# Patient Record
Sex: Female | Born: 1965 | Race: White | Hispanic: No | Marital: Single | Smoking: Former smoker
Health system: Southern US, Community
[De-identification: ages and names within clinical notes are randomized; demographics above are authoritative.]

## PROBLEM LIST (undated history)

## (undated) DIAGNOSIS — S72002A Fracture of unspecified part of neck of left femur, initial encounter for closed fracture: Secondary | ICD-10-CM

## (undated) DIAGNOSIS — M8008XA Age-related osteoporosis with current pathological fracture, vertebra(e), initial encounter for fracture: Secondary | ICD-10-CM

## (undated) DIAGNOSIS — M8580 Other specified disorders of bone density and structure, unspecified site: Secondary | ICD-10-CM

## (undated) DIAGNOSIS — K219 Gastro-esophageal reflux disease without esophagitis: Secondary | ICD-10-CM

## (undated) DIAGNOSIS — F32A Depression, unspecified: Secondary | ICD-10-CM

## (undated) DIAGNOSIS — Z8619 Personal history of other infectious and parasitic diseases: Secondary | ICD-10-CM

## (undated) DIAGNOSIS — N952 Postmenopausal atrophic vaginitis: Secondary | ICD-10-CM

## (undated) DIAGNOSIS — K8010 Calculus of gallbladder with chronic cholecystitis without obstruction: Secondary | ICD-10-CM

## (undated) DIAGNOSIS — D069 Carcinoma in situ of cervix, unspecified: Secondary | ICD-10-CM

## (undated) DIAGNOSIS — K224 Dyskinesia of esophagus: Secondary | ICD-10-CM

## (undated) DIAGNOSIS — J4489 Other specified chronic obstructive pulmonary disease: Secondary | ICD-10-CM

## (undated) DIAGNOSIS — I10 Essential (primary) hypertension: Secondary | ICD-10-CM

## (undated) DIAGNOSIS — R519 Headache, unspecified: Secondary | ICD-10-CM

## (undated) DIAGNOSIS — N6019 Diffuse cystic mastopathy of unspecified breast: Secondary | ICD-10-CM

## (undated) DIAGNOSIS — N951 Menopausal and female climacteric states: Secondary | ICD-10-CM

## (undated) DIAGNOSIS — N179 Acute kidney failure, unspecified: Secondary | ICD-10-CM

## (undated) DIAGNOSIS — K760 Fatty (change of) liver, not elsewhere classified: Secondary | ICD-10-CM

## (undated) DIAGNOSIS — C801 Malignant (primary) neoplasm, unspecified: Secondary | ICD-10-CM

## (undated) DIAGNOSIS — J449 Chronic obstructive pulmonary disease, unspecified: Secondary | ICD-10-CM

## (undated) DIAGNOSIS — I6523 Occlusion and stenosis of bilateral carotid arteries: Secondary | ICD-10-CM

## (undated) DIAGNOSIS — D329 Benign neoplasm of meninges, unspecified: Secondary | ICD-10-CM

## (undated) DIAGNOSIS — R7989 Other specified abnormal findings of blood chemistry: Secondary | ICD-10-CM

## (undated) DIAGNOSIS — I2510 Atherosclerotic heart disease of native coronary artery without angina pectoris: Secondary | ICD-10-CM

## (undated) DIAGNOSIS — E785 Hyperlipidemia, unspecified: Secondary | ICD-10-CM

## (undated) DIAGNOSIS — M48061 Spinal stenosis, lumbar region without neurogenic claudication: Secondary | ICD-10-CM

## (undated) HISTORY — PX: HAND SURGERY: SHX662

## (undated) HISTORY — PX: BREAST BIOPSY: SHX20

## (undated) HISTORY — PX: BACK SURGERY: SHX140

## (undated) HISTORY — DX: Personal history of other infectious and parasitic diseases: Z86.19

## (undated) HISTORY — PX: TONSILLECTOMY: SUR1361

## (undated) HISTORY — PX: COLONOSCOPY: SHX174

## (undated) HISTORY — DX: Menopausal and female climacteric states: N95.1

## (undated) HISTORY — PX: FRACTURE SURGERY: SHX138

## (undated) HISTORY — PX: SPINE SURGERY: SHX786

## (undated) HISTORY — DX: Other specified disorders of bone density and structure, unspecified site: M85.80

## (undated) HISTORY — PX: JOINT REPLACEMENT: SHX530

## (undated) HISTORY — DX: Postmenopausal atrophic vaginitis: N95.2

## (undated) HISTORY — DX: Gastro-esophageal reflux disease without esophagitis: K21.9

## (undated) HISTORY — PX: CHOLECYSTECTOMY: SHX55

---

## 2006-01-10 ENCOUNTER — Ambulatory Visit: Payer: Self-pay | Admitting: Obstetrics and Gynecology

## 2006-09-05 ENCOUNTER — Ambulatory Visit: Payer: Self-pay | Admitting: Family Medicine

## 2006-09-13 ENCOUNTER — Ambulatory Visit: Payer: Self-pay | Admitting: Ophthalmology

## 2007-01-22 ENCOUNTER — Ambulatory Visit: Payer: Self-pay | Admitting: Obstetrics and Gynecology

## 2009-11-21 ENCOUNTER — Ambulatory Visit: Payer: Self-pay | Admitting: Otolaryngology

## 2009-12-30 ENCOUNTER — Ambulatory Visit: Payer: Self-pay | Admitting: Surgery

## 2010-01-23 ENCOUNTER — Ambulatory Visit: Payer: Self-pay | Admitting: Surgery

## 2010-08-24 ENCOUNTER — Ambulatory Visit: Payer: Self-pay | Admitting: Family Medicine

## 2010-09-21 ENCOUNTER — Encounter: Payer: Self-pay | Admitting: Surgery

## 2010-09-24 ENCOUNTER — Encounter: Payer: Self-pay | Admitting: Surgery

## 2010-10-09 ENCOUNTER — Ambulatory Visit: Payer: Self-pay | Admitting: Obstetrics and Gynecology

## 2010-10-20 ENCOUNTER — Ambulatory Visit: Payer: Self-pay

## 2010-11-02 ENCOUNTER — Ambulatory Visit: Payer: Self-pay | Admitting: Physician Assistant

## 2010-11-07 ENCOUNTER — Ambulatory Visit: Payer: Self-pay | Admitting: Oncology

## 2010-11-13 ENCOUNTER — Ambulatory Visit: Payer: Self-pay | Admitting: Internal Medicine

## 2010-11-25 ENCOUNTER — Ambulatory Visit: Payer: Self-pay | Admitting: Internal Medicine

## 2010-11-25 ENCOUNTER — Ambulatory Visit: Payer: Self-pay | Admitting: Oncology

## 2010-12-25 ENCOUNTER — Ambulatory Visit: Payer: Self-pay | Admitting: Oncology

## 2010-12-25 ENCOUNTER — Ambulatory Visit: Payer: Self-pay | Admitting: Internal Medicine

## 2011-03-23 ENCOUNTER — Ambulatory Visit: Payer: Self-pay | Admitting: Unknown Physician Specialty

## 2011-05-17 ENCOUNTER — Ambulatory Visit: Payer: Self-pay | Admitting: Unknown Physician Specialty

## 2011-05-23 LAB — PATHOLOGY REPORT

## 2011-08-28 ENCOUNTER — Ambulatory Visit: Payer: Self-pay | Admitting: Internal Medicine

## 2011-09-18 ENCOUNTER — Ambulatory Visit: Payer: Self-pay | Admitting: Specialist

## 2011-09-21 ENCOUNTER — Ambulatory Visit: Payer: Self-pay | Admitting: Specialist

## 2011-09-21 LAB — PREGNANCY, URINE: Pregnancy Test, Urine: NEGATIVE m[IU]/mL

## 2011-10-18 ENCOUNTER — Ambulatory Visit: Payer: Self-pay | Admitting: Neurosurgery

## 2011-12-05 ENCOUNTER — Ambulatory Visit: Payer: Self-pay | Admitting: Neurosurgery

## 2011-12-31 IMAGING — NM NM BONE LIMITED
2 series · 10 of 10 positions shown · non-contrast
Comparison: none

REASON FOR EXAM: back pain malaise and fatigue
COMMENTS:

[Series 1000: 3 hr wholebody · 2.40mm/px · 2 of 2 frames shown]
[frame 1/2]
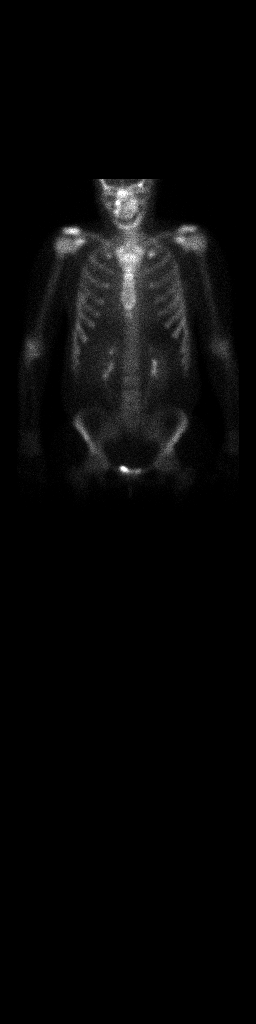
[frame 2/2]
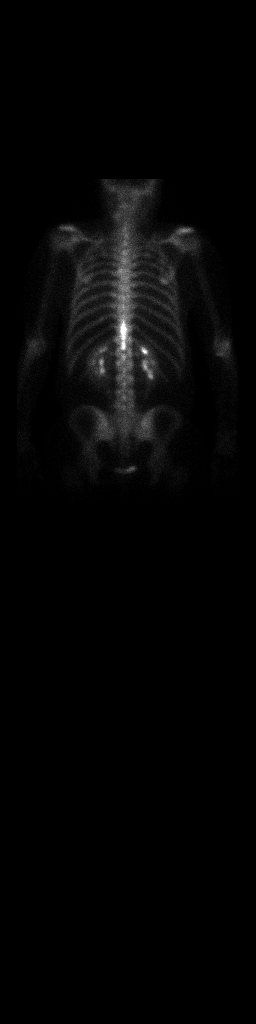

[Series 1000: statics · 2.40mm/px · 4 acquisitions, 8 frames shown]
[im 1/4]
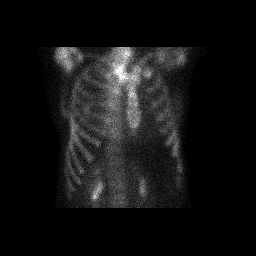
[im 1/4]
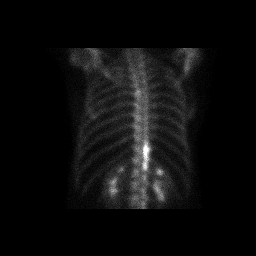
[im 2/4]
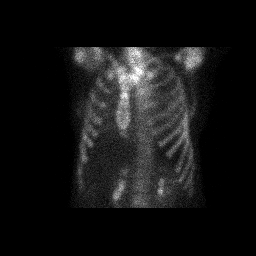
[im 2/4]
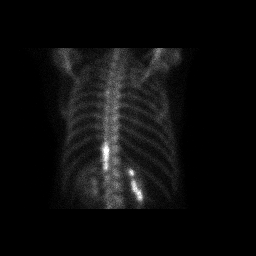
[im 3/4]
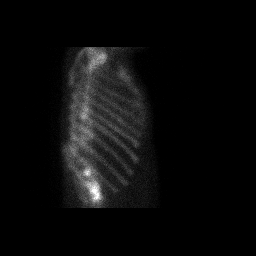
[im 3/4]
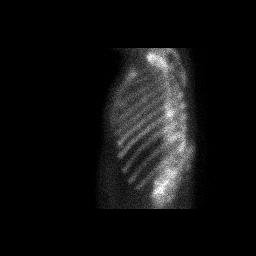
[im 4/4]
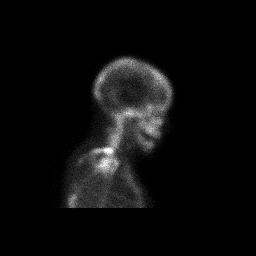
[im 4/4]
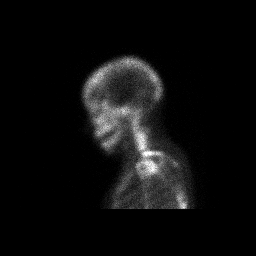

[10 of 10 positions shown; findings below may reference images not displayed]

PROCEDURE:     KNM - KNM LIMTED BONE SCAN SINGLE AREA  - November 02, 2010  [DATE]

RESULT:     The patient received 21.72 mCi of technetium 99m labeled MDP.
Images were acquired over the spine.

There is adequate uptake of the radiopharmaceutical by the skeleton.
Adequate soft tissue clearance and renal activity is demonstrated.

There is increased uptake of the radiopharmaceutical in the posterior
elements correction in the spinous process region of T11 and T12. This
appears to be midline and likely does not involve the pedicles. A tiny focus
of increased uptake at the Tkcs vertebral junction of approximately the
fifth or sixth ribs on the left is noted it is likely degenerative. The ribs
exhibit normal uptake. I see no abnormal uptake in the cervical spine or
remainder of the thoracic spine. The lumbar spine exhibits no abnormally
increased uptake. The observed portions of the shoulders and pelvis exhibit
no suspicious findings.
IMPRESSION: There is increased uptake of the radiopharmaceutical in
what appears to be the spinous processes of approximately T11 and T12. I do
not see abnormal uptake elsewhere. Specific attention to the T4 region
reveals no significant abnormality.

## 2012-02-04 ENCOUNTER — Ambulatory Visit: Payer: Self-pay | Admitting: Neurosurgery

## 2012-07-01 ENCOUNTER — Ambulatory Visit: Payer: Self-pay | Admitting: Anesthesiology

## 2012-07-08 ENCOUNTER — Emergency Department: Payer: Self-pay | Admitting: Emergency Medicine

## 2012-07-18 ENCOUNTER — Emergency Department: Payer: Self-pay | Admitting: Emergency Medicine

## 2012-07-18 LAB — BASIC METABOLIC PANEL
Calcium, Total: 9.5 mg/dL (ref 8.5–10.1)
Chloride: 107 mmol/L (ref 98–107)
Creatinine: 0.59 mg/dL — ABNORMAL LOW (ref 0.60–1.30)
EGFR (African American): >60
Osmolality: 273 (ref 275–301)
Potassium: 3.6 mmol/L (ref 3.5–5.1)

## 2012-07-18 LAB — CBC WITH DIFFERENTIAL/PLATELET
Basophil #: 0.1 10*3/uL (ref 0.0–0.1)
Eosinophil #: 0.2 10*3/uL (ref 0.0–0.7)
Eosinophil %: 2.2 %
HGB: 13.7 g/dL (ref 12.0–16.0)
MCH: 30.1 pg (ref 26.0–34.0)
MCV: 89 fL (ref 80–100)
Monocyte #: 0.6 x10 3/mm (ref 0.2–0.9)
Monocyte %: 8.7 %
Neutrophil %: 50.7 %
Platelet: 398 10*3/uL (ref 150–440)
RBC: 4.55 10*6/uL (ref 3.80–5.20)
RDW: 13.5 % (ref 11.5–14.5)

## 2012-07-29 ENCOUNTER — Ambulatory Visit: Payer: Self-pay | Admitting: Anesthesiology

## 2012-07-29 ENCOUNTER — Ambulatory Visit: Payer: Self-pay | Admitting: Obstetrics and Gynecology

## 2012-08-20 ENCOUNTER — Ambulatory Visit: Payer: Self-pay | Admitting: Hematology and Oncology

## 2012-12-10 LAB — HM PAP SMEAR: HM Pap smear: NEGATIVE

## 2013-08-19 ENCOUNTER — Ambulatory Visit: Payer: Self-pay | Admitting: Obstetrics and Gynecology

## 2013-08-19 LAB — HM MAMMOGRAPHY

## 2014-04-28 ENCOUNTER — Inpatient Hospital Stay: Payer: Self-pay | Admitting: Internal Medicine

## 2014-04-28 LAB — URINALYSIS, COMPLETE
BLOOD: NEGATIVE
Bacteria: NONE SEEN
Bilirubin,UR: NEGATIVE
Glucose,UR: NEGATIVE mg/dL (ref 0–75)
KETONE: NEGATIVE
Leukocyte Esterase: NEGATIVE
Nitrite: NEGATIVE
PH: 6 (ref 4.5–8.0)
Protein: NEGATIVE
RBC,UR: 2 /HPF (ref 0–5)
SPECIFIC GRAVITY: 1.013 (ref 1.003–1.030)
SQUAMOUS EPITHELIAL: NONE SEEN
WBC UR: 1 /HPF (ref 0–5)

## 2014-04-28 LAB — COMPREHENSIVE METABOLIC PANEL
ALT: 72 U/L — AB (ref 14–63)
Albumin: 3.6 g/dL (ref 3.4–5.0)
Alkaline Phosphatase: 82 U/L (ref 46–116)
Anion Gap: 10 (ref 7–16)
BUN: 10 mg/dL (ref 7–18)
Bilirubin,Total: 0.5 mg/dL (ref 0.2–1.0)
CREATININE: 0.82 mg/dL (ref 0.60–1.30)
Calcium, Total: 8.6 mg/dL (ref 8.5–10.1)
Chloride: 111 mmol/L — ABNORMAL HIGH (ref 98–107)
Co2: 25 mmol/L (ref 21–32)
EGFR (African American): >60
EGFR (Non-African Amer.): >60
GLUCOSE: 117 mg/dL — AB (ref 65–99)
Osmolality: 291 (ref 275–301)
Potassium: 3.3 mmol/L — ABNORMAL LOW (ref 3.5–5.1)
SGOT(AST): 44 U/L — ABNORMAL HIGH (ref 15–37)
SODIUM: 146 mmol/L — AB (ref 136–145)
Total Protein: 7.4 g/dL (ref 6.4–8.2)

## 2014-04-28 LAB — DRUG SCREEN, URINE
AMPHETAMINES, UR SCREEN: NEGATIVE (ref ?–1000)
Barbiturates, Ur Screen: NEGATIVE (ref ?–200)
Benzodiazepine, Ur Scrn: NEGATIVE (ref ?–200)
COCAINE METABOLITE, UR ~~LOC~~: POSITIVE (ref ?–300)
Cannabinoid 50 Ng, Ur ~~LOC~~: NEGATIVE (ref ?–50)
MDMA (ECSTASY) UR SCREEN: NEGATIVE (ref ?–500)
Methadone, Ur Screen: NEGATIVE (ref ?–300)
OPIATE, UR SCREEN: NEGATIVE (ref ?–300)
Phencyclidine (PCP) Ur S: NEGATIVE (ref ?–25)
Tricyclic, Ur Screen: NEGATIVE (ref ?–1000)

## 2014-04-28 LAB — CBC
HCT: 40.7 % (ref 35.0–47.0)
HGB: 13.9 g/dL (ref 12.0–16.0)
MCH: 31.9 pg (ref 26.0–34.0)
MCHC: 34 g/dL (ref 32.0–36.0)
MCV: 94 fL (ref 80–100)
Platelet: 363 10*3/uL (ref 150–440)
RBC: 4.34 10*6/uL (ref 3.80–5.20)
RDW: 14 % (ref 11.5–14.5)
WBC: 9.7 10*3/uL (ref 3.6–11.0)

## 2014-04-28 LAB — TSH: Thyroid Stimulating Horm: 0.44 u[IU]/mL — ABNORMAL LOW

## 2014-04-28 LAB — ACETAMINOPHEN LEVEL

## 2014-04-28 LAB — SALICYLATE LEVEL: SALICYLATES, SERUM: 2.3 mg/dL

## 2014-04-28 LAB — ETHANOL: Ethanol: 220 mg/dL

## 2014-04-29 LAB — COMPREHENSIVE METABOLIC PANEL
ANION GAP: 8 (ref 7–16)
AST: 28 U/L (ref 15–37)
Albumin: 3.2 g/dL — ABNORMAL LOW (ref 3.4–5.0)
Alkaline Phosphatase: 71 U/L (ref 46–116)
BUN: 11 mg/dL (ref 7–18)
Bilirubin,Total: 0.4 mg/dL (ref 0.2–1.0)
CALCIUM: 7.8 mg/dL — AB (ref 8.5–10.1)
CHLORIDE: 113 mmol/L — AB (ref 98–107)
Co2: 24 mmol/L (ref 21–32)
Creatinine: 0.75 mg/dL (ref 0.60–1.30)
EGFR (African American): >60
Glucose: 86 mg/dL (ref 65–99)
Osmolality: 287 (ref 275–301)
POTASSIUM: 3.7 mmol/L (ref 3.5–5.1)
SGPT (ALT): 56 U/L (ref 14–63)
Sodium: 145 mmol/L (ref 136–145)
TOTAL PROTEIN: 6.1 g/dL — AB (ref 6.4–8.2)

## 2014-04-29 LAB — CBC WITH DIFFERENTIAL/PLATELET
BASOS PCT: 0.2 %
Basophil #: 0 10*3/uL (ref 0.0–0.1)
EOS PCT: 0.5 %
Eosinophil #: 0.1 10*3/uL (ref 0.0–0.7)
HCT: 34.1 % — ABNORMAL LOW (ref 35.0–47.0)
HGB: 11.6 g/dL — AB (ref 12.0–16.0)
LYMPHS ABS: 3.1 10*3/uL (ref 1.0–3.6)
Lymphocyte %: 28.7 %
MCH: 32.2 pg (ref 26.0–34.0)
MCHC: 34.1 g/dL (ref 32.0–36.0)
MCV: 94 fL (ref 80–100)
MONOS PCT: 7.3 %
Monocyte #: 0.8 x10 3/mm (ref 0.2–0.9)
NEUTROS ABS: 6.8 10*3/uL — AB (ref 1.4–6.5)
Neutrophil %: 63.3 %
Platelet: 287 10*3/uL (ref 150–440)
RBC: 3.61 10*6/uL — AB (ref 3.80–5.20)
RDW: 14 % (ref 11.5–14.5)
WBC: 10.7 10*3/uL (ref 3.6–11.0)

## 2014-04-29 LAB — MAGNESIUM: Magnesium: 1.6 mg/dL — ABNORMAL LOW

## 2014-04-29 LAB — PHOSPHORUS: Phosphorus: 3.4 mg/dL (ref 2.5–4.9)

## 2014-04-30 LAB — COMPREHENSIVE METABOLIC PANEL
ALT: 46 U/L (ref 14–63)
ANION GAP: 5 — AB (ref 7–16)
Albumin: 2.5 g/dL — ABNORMAL LOW (ref 3.4–5.0)
Alkaline Phosphatase: 61 U/L (ref 46–116)
BUN: 7 mg/dL (ref 7–18)
Bilirubin,Total: 0.4 mg/dL (ref 0.2–1.0)
CO2: 25 mmol/L (ref 21–32)
CREATININE: 0.64 mg/dL (ref 0.60–1.30)
Calcium, Total: 7.8 mg/dL — ABNORMAL LOW (ref 8.5–10.1)
Chloride: 109 mmol/L — ABNORMAL HIGH (ref 98–107)
EGFR (African American): >60
GLUCOSE: 112 mg/dL — AB (ref 65–99)
Osmolality: 276 (ref 275–301)
Potassium: 3.8 mmol/L (ref 3.5–5.1)
SGOT(AST): 27 U/L (ref 15–37)
Sodium: 139 mmol/L (ref 136–145)
Total Protein: 5.4 g/dL — ABNORMAL LOW (ref 6.4–8.2)

## 2014-04-30 LAB — CBC WITH DIFFERENTIAL/PLATELET
BASOS ABS: 0 10*3/uL (ref 0.0–0.1)
Basophil %: 0.4 %
EOS ABS: 0.1 10*3/uL (ref 0.0–0.7)
Eosinophil %: 1.3 %
HCT: 31 % — AB (ref 35.0–47.0)
HGB: 10.3 g/dL — AB (ref 12.0–16.0)
LYMPHS PCT: 27.9 %
Lymphocyte #: 2.3 10*3/uL (ref 1.0–3.6)
MCH: 31.7 pg (ref 26.0–34.0)
MCHC: 33.1 g/dL (ref 32.0–36.0)
MCV: 96 fL (ref 80–100)
MONOS PCT: 9 %
Monocyte #: 0.7 x10 3/mm (ref 0.2–0.9)
NEUTROS PCT: 61.4 %
Neutrophil #: 5 10*3/uL (ref 1.4–6.5)
Platelet: 240 10*3/uL (ref 150–440)
RBC: 3.25 10*6/uL — ABNORMAL LOW (ref 3.80–5.20)
RDW: 14.2 % (ref 11.5–14.5)
WBC: 8.2 10*3/uL (ref 3.6–11.0)

## 2014-04-30 LAB — PHOSPHORUS: Phosphorus: 1.6 mg/dL — ABNORMAL LOW (ref 2.5–4.9)

## 2014-04-30 LAB — MAGNESIUM: Magnesium: 2.1 mg/dL

## 2014-05-01 LAB — BASIC METABOLIC PANEL
Anion Gap: 3 — ABNORMAL LOW (ref 7–16)
BUN: 6 mg/dL — AB (ref 7–18)
Calcium, Total: 8.3 mg/dL — ABNORMAL LOW (ref 8.5–10.1)
Chloride: 110 mmol/L — ABNORMAL HIGH (ref 98–107)
Co2: 30 mmol/L (ref 21–32)
Creatinine: 0.61 mg/dL (ref 0.60–1.30)
GLUCOSE: 122 mg/dL — AB (ref 65–99)
OSMOLALITY: 284 (ref 275–301)
Potassium: 3.9 mmol/L (ref 3.5–5.1)
Sodium: 143 mmol/L (ref 136–145)

## 2014-05-01 LAB — PHOSPHORUS: Phosphorus: 2 mg/dL — ABNORMAL LOW (ref 2.5–4.9)

## 2014-05-01 LAB — CBC WITH DIFFERENTIAL/PLATELET
BASOS ABS: 0 10*3/uL (ref 0.0–0.1)
Basophil %: 0.3 %
EOS ABS: 0.2 10*3/uL (ref 0.0–0.7)
Eosinophil %: 2.2 %
HCT: 29.3 % — ABNORMAL LOW (ref 35.0–47.0)
HGB: 9.9 g/dL — ABNORMAL LOW (ref 12.0–16.0)
LYMPHS ABS: 2 10*3/uL (ref 1.0–3.6)
Lymphocyte %: 24.3 %
MCH: 32.2 pg (ref 26.0–34.0)
MCHC: 33.6 g/dL (ref 32.0–36.0)
MCV: 96 fL (ref 80–100)
Monocyte #: 0.8 x10 3/mm (ref 0.2–0.9)
Monocyte %: 9.7 %
Neutrophil #: 5.1 10*3/uL (ref 1.4–6.5)
Neutrophil %: 63.5 %
Platelet: 198 10*3/uL (ref 150–440)
RBC: 3.07 10*6/uL — ABNORMAL LOW (ref 3.80–5.20)
RDW: 14.1 % (ref 11.5–14.5)
WBC: 8 10*3/uL (ref 3.6–11.0)

## 2014-05-01 LAB — MAGNESIUM: MAGNESIUM: 1.8 mg/dL

## 2014-05-02 LAB — CBC WITH DIFFERENTIAL/PLATELET
Basophil #: 0 10*3/uL (ref 0.0–0.1)
Basophil %: 0.5 %
EOS ABS: 0.2 10*3/uL (ref 0.0–0.7)
Eosinophil %: 2.3 %
HCT: 31 % — ABNORMAL LOW (ref 35.0–47.0)
HGB: 10.3 g/dL — ABNORMAL LOW (ref 12.0–16.0)
LYMPHS PCT: 16.4 %
Lymphocyte #: 1.5 10*3/uL (ref 1.0–3.6)
MCH: 31.8 pg (ref 26.0–34.0)
MCHC: 33.3 g/dL (ref 32.0–36.0)
MCV: 95 fL (ref 80–100)
Monocyte #: 1 x10 3/mm — ABNORMAL HIGH (ref 0.2–0.9)
Monocyte %: 10.2 %
NEUTROS PCT: 70.6 %
Neutrophil #: 6.7 10*3/uL — ABNORMAL HIGH (ref 1.4–6.5)
Platelet: 211 10*3/uL (ref 150–440)
RBC: 3.25 10*6/uL — ABNORMAL LOW (ref 3.80–5.20)
RDW: 14 % (ref 11.5–14.5)
WBC: 9.5 10*3/uL (ref 3.6–11.0)

## 2014-05-02 LAB — BASIC METABOLIC PANEL
Anion Gap: 5 — ABNORMAL LOW (ref 7–16)
BUN: 9 mg/dL (ref 7–18)
CALCIUM: 8.6 mg/dL (ref 8.5–10.1)
CHLORIDE: 105 mmol/L (ref 98–107)
CREATININE: 0.57 mg/dL — AB (ref 0.60–1.30)
Co2: 31 mmol/L (ref 21–32)
EGFR (African American): >60
EGFR (Non-African Amer.): >60
Glucose: 109 mg/dL — ABNORMAL HIGH (ref 65–99)
Osmolality: 281 (ref 275–301)
Potassium: 3.9 mmol/L (ref 3.5–5.1)
SODIUM: 141 mmol/L (ref 136–145)

## 2014-05-02 LAB — MAGNESIUM: Magnesium: 1.6 mg/dL — ABNORMAL LOW

## 2014-05-02 LAB — PHOSPHORUS: Phosphorus: 3.2 mg/dL (ref 2.5–4.9)

## 2014-05-03 LAB — EXPECTORATED SPUTUM ASSESSMENT W GRAM STAIN, RFLX TO RESP C

## 2014-05-14 ENCOUNTER — Inpatient Hospital Stay: Payer: Self-pay | Admitting: Psychiatry

## 2014-06-07 ENCOUNTER — Ambulatory Visit: Payer: Self-pay | Admitting: Family Medicine

## 2014-07-18 NOTE — Op Note (Signed)
PATIENT NAME:  Shirley Evans, Shirley Evans MR#:  [id number] DATE OF BIRTH:  01/07/1966  DATE OF PROCEDURE:  09/21/2011  PREOPERATIVE DIAGNOSIS: Foreign body right thenar eminence.   POSTOPERATIVE DIAGNOSIS: Several small foreign bodies with scar tissue present.   PROCEDURE: Excision of foreign bodies and scar tissue, right thenar eminence.   SURGEON: Lucas Mallow, MD   ANESTHESIA: General.   COMPLICATIONS: None.   TOURNIQUET TIME: Approximately 45 minutes.   PROCEDURE: After adequate induction of general anesthesia, the right upper extremity is thoroughly prepped with alcohol and ChloraPrep and draped in standard sterile fashion. Extremity is wrapped out with the Esmarch bandage and pneumatic tourniquet elevated to 250 mmHg. Under loupe magnification the line of the previous laceration is entered in the midportion of the thenar eminence. The hypertrophic scar is completely excised. Dissection is carefully carried down into the thenar eminence under loupe magnification. There is seen to be four to five pieces of dark foreign body which are approximately 1 mm in size. This is dirt contamination or possibly small metallic fragments. These are surrounded by a large amount of hypertrophic scar tissue. This was all carefully excised down to the flexor pollicis longus which is preserved. Careful palpation demonstrates no residual scar tissue. The FluoroScan was used and no residual foreign body could be seen. The wound is thoroughly irrigated multiple times. Skin edges are closed with 4-0 nylon. Soft bulky dressing is applied. Tourniquet is released. The patient is returned to the recovery room in satisfactory condition having tolerated the procedure quite well.   ____________________________ Lucas Mallow, MD ces:drc D: 09/21/2011 11:15:42 ET T: 09/21/2011 11:30:31 ET JOB#: [id number] cc: Lucas Mallow, MD, <Dictator> Lucas Mallow MD ELECTRONICALLY SIGNED 09/27/2011 8:35

## 2014-07-25 NOTE — H&P (Signed)
PATIENT NAME:  Shirley Evans, Shirley Evans MR#:  [id number] DATE OF BIRTH:  1966/02/10  DATE OF ADMISSION:  05/14/2014  IDENTIFYING INFORMATION: The patient is a 49 year old female who was initially admitted on February 3 after she overdosed on Robaxin and Neurontin.   CHIEF COMPLAINT: Overdose on her medications.   HISTORY OF PRESENT ILLNESS: The patient was initially admitted to critical care unit and after a prolonged period, she was transferred to the regular floor. The patient was initially intubated and was unable to provide any history. After her initial stabilization, she was seen by Dr. Weber Cooks, who interviewed her for psychiatric consult. She was transferred to the psychiatric unit last night. During my interview, the patient reported that she overdosed on her back pain medications. She reported that she does not remember  why she took an overdose of the medication. She reported that she was not trying to kill herself. She stated that she took too many pills, as she might be stressed out relating to her money issues, as they were trying to stop her disability. She is getting the pain medications from the [facility]. She stated that she is also getting the steroid injections in her back. The patient currently denied having any mood swings, anger, anxiety, or paranoia. She stated that she does not recall any psychotic symptoms. She denied using any drugs or alcohol at this time. She is motivated to quit smoking at this time.   PAST PSYCHIATRIC HISTORY: The patient denied any previous history of psychiatric hospitalizations. She is not taking any psychotropic medications at this time.   FAMILY HISTORY: Unknown.   SOCIAL HISTORY: The patient currently lives with her boyfriend in between her mother. Reported that she is best friends with her mother. She was on disability, which is going to stop soon.   PAST MEDICAL HISTORY: History of GERD, chronic pain. She has been taking Neurontin and Robaxin for  pain and is also getting steroid shots. She also has a questionable history of Hashimoto disease or autoimmune condition.   CURRENT MEDICATIONS: Prozac, Seroquel, and trazodone.   ALLERGIES: CODEINE AND DEMEROL.   VITAL SIGNS: Glucose 153, BUN 19, creatinine 0.65 sodium 140, potassium 3.2, chloride 104, bicarbonate 28, anion gap 8, osmolality 60, calcium 9.4, phosphorus 2.2. WBC 8.5, RBC 3.89, hemoglobin 12.1, platelet count 549,000.   MENTAL STATUS EXAMINATION: The patient is a moderately built female who appeared her stated age. She was calm and cooperative. She maintained fair eye contact. Her speech was normal in tone and volume. Mood was fine. Affect was congruent. Thought process was logical, goal-directed. Thought content was non-delusional. She currently denied having any suicidal or homicidal ideations or plans. Her insight and judgment were fair.   DIAGNOSTIC IMPRESSION: AXIS I: Major depressive disorder, single episode, severe.  AXIS II: None reported.  AXIS III: Recent admission to the intensive care unit, status post overdose.   AXIS IV: Moderate. Current global assessment of functioning 25.   TREATMENT PLAN: 1. The patient recently transferred from the medical unit for psychiatric stabilization.  2. She will continue on her current psychotropic medication as follows:     1. Prozac 20 mg p.o. daily.     2. Trazodone 100 mg p.o. at bedtime for insomnia.  3. I will start her on Chantix 0.5 mg p.o. daily for smoking cessation.   Discussed with the patient about the plan, and she agreed at this time.   Thank you for allowing me to participate in the care of  this patient.    ____________________________ Cordelia Pen. Gretel Acre, MD usf:mw D: 05/15/2014 13:21:37 ET T: 05/15/2014 13:33:57 ET JOB#: [id number]  cc: Cordelia Pen. Gretel Acre, MD, <Dictator> Jeronimo Norma MD ELECTRONICALLY SIGNED 05/16/2014 13:11

## 2014-07-25 NOTE — H&P (Signed)
PATIENT NAME:  Shirley Evans, Shirley Evans MR#:  [id number] DATE OF BIRTH:  04/14/1965  DATE OF ADMISSION:  04/28/2014  PRIMARY CARE PROVIDER: Valetta Close, MD  CHIEF COMPLAINT: Overdose on multiple medications, confusion.   HISTORY OF PRESENTING ILLNESS: A 49 year old Caucasian female patient with history of depression, chronic pain, neuropathy, was found in the bathtub by her mother with empty bottles of Neurontin and Robaxin. Per her mother, the patient has been talking about suicidal ideation for the past few days, but never had a suicide attempt in the past.   The patient also has alcohol in her blood, along with cocaine positive on urine drug screen.   History has been obtained from ER staff, old records. The patient had to be intubated to protect her airway after she had multiple bouts of vomiting and low GCS.   PAST MEDICAL HISTORY: 1.  Chronic back pain.  2.  Meningioma.  3.  Fibrocystic breast disease.  4.  GERD.   PAST SURGICAL HISTORY: Right breast, 2 cysts removed when she was 17 and tonsillectomy.   SOCIAL HISTORY: The patient continues to smoke a pack a day. Occasional alcohol use.   FAMILY HISTORY: Paternal uncle had prostate cancer. A paternal aunt had breast cancer and cervical cancer.   HOME MEDICATIONS: 1.  Methocarbamol 750 mg oral once a day.  2.  Tramadol 50 mg every 6 hours as needed.  3.  Neurontin 300 mg oral 2 times a day.   REVIEW OF SYSTEMS:  Unobtainable due to the patient's encephalopathy and being intubated.   ALLERGIES: CODEINE AND DEMEROL.   PHYSICAL EXAMINATION: VITAL SIGNS: Shows temperature 96.2, pulse of 90, blood pressure 139/87, saturating 100% on oxygen on the ventilator.  GENERAL: Moderately built Caucasian female patient lying in bed, drowsy, ET tube in place.  HEENT: Normocephalic. Mucosa moist and pink.  NECK: Supple. No thyromegaly. No palpable lymph nodes.  CARDIOVASCULAR: S1, S2, without any murmurs. No edema.  RESPIRATORY: Clear to  auscultation on both sides.  GASTROINTESTINAL: Soft abdomen. No rigidity or guarding found. Bowel sounds present.  GENITOURINARY: Foley catheter in place with clear urine.  MUSCULOSKELETAL: No joint swelling, redness, effusion. Normal muscle tone.  NEUROLOGICAL: Babinski's downgoing, reflexes 2+, withdraws to pain.   LABORATORY DATA:  Glucose of 117, BUN 10, creatinine 0.82, sodium 146, potassium 3.3, chloride 111. AST and ALT mildly elevated at 72, 44. Alkaline phosphatase, bilirubin normal. TSH 0.44. Urine drug screen positive for cocaine. WBC 9.7, hemoglobin 13.9, platelets 363,000. Urinalysis shows no bacteria, 1 WBC. Salicylates 2.3. Acetaminophen less than 2. Alcohol level 220.   ABG shows pH of 7.29 with pCO2 of 40.   EKG shows normal sinus rhythm, nothing acute.   Chest x-ray, portable, showed no edema, effusion, infiltrates; ET tube in place.   ASSESSMENT AND PLAN: 1.  Polysubstance overdose in a patient with suicidal ideation. At this time, the patient is being placed under involuntary commitment by the Emergency Room physician. She has been intubated to protect her airway and for encephalopathy. Will be admitted to intensive care unit. We will continue supportive care.  2.  At this point, she does seem dehydrated with elevated sodium. We will start her on half normal saline. We will consult psychiatry to see the patient when she is more awake.  3.  The patient is high risk for alcohol withdrawal off the ventilator, although her alcohol intake cannot be quantified at this point.  4.  Hypokalemia. Replace potassium through intravenous.  5.  Acidosis on ABG, pCO2 is normal. This seems to be a metabolic acidosis. Will check a lactic acid level.  6.  Deep vein thrombosis prophylaxis with Lovenox.   CODE STATUS: Full code.   TIME SPENT TODAY ON THIS PATIENT TODAY: 40 minutes.   ____________________________ Leia Alf Shirley Lantry, MD srs:LT D: 04/28/2014 24:23:53 ET T: 04/28/2014  17:25:22 ET JOB#: [id number]  cc: Alveta Heimlich R. Darvin Neighbours, MD, <Dictator> Valetta Close, MD Neita Carp MD ELECTRONICALLY SIGNED 05/03/2014 3:58

## 2014-07-25 NOTE — Consult Note (Signed)
PATIENT NAME:  Shirley Evans, Shirley Evans MR#:  [id number] DATE OF BIRTH:  May 17, 1965  DATE OF CONSULTATION:  05/17/2014  REFERRING PHYSICIAN:  Alethia Berthold, MD.  CONSULTING PHYSICIAN:  A. Lavone Orn, MD  CHIEF COMPLAINT: Elevated thyroglobulin antibody.   HISTORY OF PRESENT ILLNESS: This is a 49 year old female admitted on 04/28/2014 due to overdose on Robaxin and Neurontin. The patient has had a prolonged hospitalization during which time she was intubated for a time in the intensive care unit, she  had mental status changes concerning for an encephalopathy which was ultimately attributed to her medications as well as intubated status. She was eventually weaned off the mechanical ventilator and clinically improved enough to transfer to the mental health unit. It was noted on 05/07/2014 that her TSH level was low at 0.24 uIU/mL  (reference range 0.45-4.5) as well as thyroglobulin antibody level was elevated at 9.8 IU/mL. On 05/09/2014 free T4 was noted to be normal at 1.04 ng/dL, and free T3 was low at 1.7 pg/dL. She has no prior history of thyroid disease. She denies symptoms of hyperthyroidism such as heat intolerance, tremor, or heart racing. She does report anxiety. Sleep is fair. She has not had any recent weight loss, in fact reports about a 30 pound weight gain over the last 6 months. Prior to hospitalization she was receiving steroid injections for back pain. She has been getting them monthly, last injection was January 2016. She has not had any recent use of lithium. On 05/05/2014 she did have exposure to contrast dye when she underwent a CT head with and without contrast. That study showed no acute intracranial findings. No recent use of amiodarone. She denies neck pain. Denies recent fever.   PAST MEDICAL HISTORY:  1. GERD.   2. Back pain.   3. Meningioma.   FAMILY HISTORY: The patient reports that paternal grandmother as well as several paternal cousins may have some sort of thyroid disorder,  details not clear. Father had colon cancer and mother had diabetes.   SOCIAL HISTORY: The patient has been on disability for back pain. Lives with boyfriend.   ALLERGIES: CODEINE, DEMEROL.   REVIEW OF SYSTEMS:    GENERAL: She reports weight gain as per HPI. No recent fevers.  HEENT: Denies blurred vision. Denies headache.  NECK: Denies neck pain or dysphagia.  CARDIAC: Denies chest pain or palpitation.  PULMONARY: Denies cough or shortness of breath.  ABDOMEN: Denies abdominal pain. Denies nausea.  ENDOCRINE:  Denies heat and cold intolerance.  NEUROLOGIC: No tremor. No dysarthria.  EXTREMITIES:  No lower extremity swelling.  SKIN: No rash or recent skin changes.  GENITOURINARY: No dysuria or hematuria.   PHYSICAL EXAMINATION:  VITAL SIGNS: Height 63.9 inches, weight 148 pounds, temperature 98.9, pulse 87, respirations 20, blood pressure 122/75.  GENERAL: Well-developed white female in no acute distress.  HEENT: EOMI.  Oropharynx is clear. Mucous membranes moist.  NECK: Supple. No appreciable thyromegaly. No palpable thyroid nodules. No neck tenderness elicited.  CARDIAC: Regular rate and rhythm. No audible murmur.  PULMONARY: Clear to auscultation bilaterally.  MUSCULOSKELETAL: She has dorsocervical fat pads bilaterally. No lower extremity edema.  SKIN: No rash or dermatopathy noted.  PSYCHIATRIC: Calm, cooperative, alert, and oriented.  ABDOMEN: Diffusely soft, nontender, nondistended.   LABORATORY DATA: Laboratories per HPI. No more recent laboratories in last 7 days.   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   PLAN:  Low TSH may be due to sick euthyroid versus mild  hyperthyroidism versus TSH suppression from glucocorticoids. Recent exposure to iodinated contrast dye may have led to mild hyperthyroidism. I plan to repeat a TSH level tomorrow morning. If TSH has normalized, then she does not have hyperthyroidism  and rather had sick euthyroid in the setting of critical illness. The elevated antithyroglobulin antibody suggests propensity towards autoimmune thyroid disease, however if TSH is normal then she does not currently have thyroid dysfunction. Elevated thyroid antibodies of various types (antithyroglobulin antibodies, thyroid peroxidase antibodies, and thyrotropin receptor antibodies), are often seen in individuals with autoimmune thyroid disease. In and of themselves, the antibodies have no clinical significance other than the propensity towards thyroid disease. Must rely on thyroid function tests including TSH, T4, and T3 to assess for true thyroid dysfunction. Again she did have a mildly suppressed TSH last week and she may have mild hyperthyroidism as a cause. Key will be to assess thyroid function TSH at this time to determine whether this needs treatment.   Plan to follow up with patient once her TSH is resulted. If TSH is normal then  no followup will be needed. Routine annual TSH testing may be helpful.    ____________________________ A. Lavone Orn, MD ams:bu D: 05/17/2014 20:40:50 ET T: 05/17/2014 21:03:24 ET JOB#: [id number]  cc: A. Lavone Orn, MD, <Dictator> Sherlon Handing MD ELECTRONICALLY SIGNED 05/21/2014 17:35

## 2014-07-25 NOTE — Consult Note (Signed)
PATIENT NAME:  Shirley Evans, Shirley Evans MR#:  [id number] DATE OF BIRTH:  1965/08/11  DATE OF CONSULTATION:  05/04/2014  REFERRING PHYSICIAN:   CONSULTING PHYSICIAN:  Gonzella Lex, MD  HISTORY OF PRESENT ILLNESS: A 49 year old woman who presented to the hospital on February 3 having been found unconscious with evidence of possible intentional overdose. Has been in the critical care unit since then. I came by to see the patient today, but she is still intubated. It looks like extubation was attempted and the patient was not able to tolerate it yet, so she is back on the ventilator. I note that she had cocaine in her system when she was admitted as well as an intoxicated level of alcohol. Family had reported that she had made suicidal statements, but that she had not had a past history of suicidal behavior. Further evaluation right now is not possible.   PAST PSYCHIATRIC HISTORY: None known at this time.   PAST MEDICAL HISTORY: A meningioma and also of some chronic pain, has had back surgery in the past.   ASSESSMENT: A 49 year old woman who certainly may have made a suicide attempt, although we do not have all the information yet. I will hold off on gathering anymore collateral history until it becomes clear that the patient is going to recover and can be interviewed. I will check in on her regularly, if I am needed sooner please let me know.   DIAGNOSIS, PRINCIPAL AND PRIMARY:  AXIS I: Delirium due to overdose of multiple medications, rule out depression.    ____________________________ Gonzella Lex, MD jtc:TM D: 05/04/2014 18:35:55 ET T: 05/04/2014 18:56:31 ET JOB#: [id number]  cc: Gonzella Lex, MD, <Dictator> Gonzella Lex MD ELECTRONICALLY SIGNED 05/05/2014 17:29

## 2014-07-25 NOTE — Consult Note (Signed)
PATIENT NAME:  Shirley Evans, Shirley Evans MR#:  [id number] DATE OF BIRTH:  Oct 28, 1965  DATE OF CONSULTATION:  05/14/2014  REFERRING PHYSICIAN:   CONSULTING PHYSICIAN:  Gonzella Lex, MD  IDENTIFYING INFORMATION AND REASON FOR CONSULTATION: A 49 year old woman who was brought to the hospital on February 3 after a presumed overdose possibly on Robaxin and Neurontin. She had an extended stay in the Critical Care Unit and a prolonged period before return of full consciousness. The patient is now alert and able to converse and ready for consideration of psychiatric treatment. The patient tells me that she is still feeling sad and depressed.   HISTORY OF PRESENT ILLNESS: Brought to the hospital on February 3. Was unconscious at that time. Remained at first intubated and unable to give history for many days. In the last couple of days, she has woken up to where I have gotten to talk with her. The patient reports that she had been under at great deal of stress recently. Began to feel hopeless over the last couple of weeks prior to her hospitalization. Was overwhelmed by financial burdens and feeling unsupported. She says that she does not remember the actual incident of taking the pills, but  remembers having had suicidal thoughts. Her mother had reported that the patient had been voicing suicidal ideation for a few days prior to hospitalization. The patient does not recall any psychotic symptoms. She minimizes the degree to which she was drinking or abusing drugs regularly.   PAST PSYCHIATRIC HISTORY: No previous psychiatric hospitalizations or suicide attempts. Apparently had never been on any kind of psychiatric medication or treatment in the past.   FAMILY HISTORY: Unknown.   SOCIAL HISTORY: Fairly isolated, although she has family of origin living nearby. Had recently, I believe, not been able to work.   PAST MEDICAL HISTORY: History of gastric reflux symptoms, also chronic pain. She had been taking Neurontin  and Robaxin for pain which was preventing her from going back to work. After being in the hospital, the patient had a prolonged period without waking up properly and was worked up by neurology. I do not think a fully satisfactory explanation has been found for the prolonged awakening and the continued dysphagia. There were several tests done and one  remarkable outstanding finding is an elevated thyroglobulin antibody. Dr. Tamala Julian proposed the possibility of Hashimoto disease or an autoimmune condition, although it looks like the autoimmune studies have come up unremarkable. The patient continues to have significant dysarthria and dysphagia and difficulty walking, although it certainly getting better.   CURRENT MEDICATIONS: She was still on Vimpat over the last couple of days, although there did not seem to be any indication of seizure activity and Dr. Tamala Julian recommended discontinuing it. She has been started on Seroquel 50 mg at night to help with sleep. Zolpidem p.r.n., Pepcid 20 mg twice a day. She was on amantadine q. 12 hours for the last few days. Prior to coming into the hospital had been on Neurontin and Robaxin.   ALLERGIES: CODEINE AND DEMEROL.   REVIEW OF SYSTEMS: Continues to feel tired and run down. Sluggish. Some difficulty swallowing. Tearful and sad. No active suicidal intent. No hallucinations.   MENTAL STATUS EXAMINATION: Tired-looking woman who looks her stated age. Able to make good eye contact. Psychomotor activity is still slow, but getting better. Speech is mildly dysarthric, but now able to converse readily. Affect is tearful, sad, and dysphoric. Mood is stated as being still depressed. Thoughts are slow, but lucid.  No obvious delusions. Denies hallucinations. No acute suicidal intent or plan. No homicidal ideation. She is alert and oriented x 4. Can repeat 3 objects immediately, remembers 2 of them at 3 minutes. Judgment and insight improving.   LABORATORY DATA: Laboratory results  done during her prolonged hospitalization are too numerous to review, although the thyroglobulin antibody is elevated. The antinuclear antibody panel appears to all be negative. She had an elevated C-reactive protein.   VITAL SIGNS: Currently blood pressure 105/77, respirations 20, pulse 84, temperature 97.1.   ASSESSMENT: A 49 year old woman with a history of recent symptoms of major depression, made a serious suicide attempt. Continues to be sad, dysphoric, tearful. Medically, she is improving to where she is now able to ambulate with a walker and eat, although she still requires thickened liquids.   TREATMENT PLAN: The patient will be admitted to the psychiatric ward after discharge from the medical service. I am discontinuing the Vimpat and the amantadine. Discontinue Seroquel. Start trazodone at night for sleep and Prozac for depression. I am going to go ahead and order an endocrine consult as was advised by Dr. Tamala Julian in his consults. She will have a sitter at the recommendation of the nursing staff because of concern about safety from her eating and her walking for the time being. Suicide precautions in place. She can use a walker on the unit. The patient was evaluated by nursing staff who felt comfortable with the admission under the current circumstances.   DIAGNOSIS, PRINCIPAL AND PRIMARY:  AXIS I: Major depression, single, severe.   SECONDARY DIAGNOSES:  AXIS I: Delirium due to multiple medical causes, now resolving.  AXIS II: Deferred.  AXIS III: Resolving dysphagia and dysarthria, improving gait.     ____________________________ Gonzella Lex, MD jtc:ts D: 05/14/2014 23:37:12 ET T: 05/14/2014 23:51:13 ET JOB#: [id number]  cc: Gonzella Lex, MD, <Dictator> Gonzella Lex MD ELECTRONICALLY SIGNED 06/01/2014 10:35

## 2014-07-25 NOTE — Consult Note (Signed)
Psychiatry: Follow-up note for this 49 year old woman who has been in the critical care unit after a presumed overdose.  Today she was extubated and has managed to stay off the vent all afternoon.  On interview this evening the patient says she is feeling tired, has some throat soreness and back soreness but otherwise no specific complaint. is not feeling up to offering a more detailed review of systems. mental status this patient makes poor eye contact, psychomotor activity very restrained.  Speech very quiet and minimal in amount.  Affect tearful and reactive.  Mood stated as "I don't remember".  Patient was given a basic description of the circumstances of her admission.  She stated she does not remember any of it and would not confirm or deny any suicidality.  Currently she is cooperative with treatment.  Multiple family members are present. chose not to push for more detail today because it is clearly a difficult and stressful day for her.  I have renewed her involuntary commitment on the basis of her serious suicide attempt which was nearly successful as well as her continued obvious emotional distress. tomorrow.  No change in medication right now. Major depression severe  Electronic Signatures: Lisaanne Lawrie, Madie Reno (MD)  (Signed on 16-Feb-16 18:27)  Authored  Last Updated: 16-Feb-16 18:27 by Gonzella Lex (MD)

## 2014-07-25 NOTE — Consult Note (Signed)
Referring Physician:  Alba Destine :   Primary Care Physician:  Alba Destine : [facility], [address], [place], [place] [address], [place] [phone]  Reason for Consult: Admit Date: 28-Apr-2014  Chief Complaint: drug overdose  Reason for Consult: confusion   History of Present Illness: History of Present Illness:   seenat request of Dr. Darvin Neighbours.  49 yo RHD F presents to [facility] with confusion after apparent drug overdose.  Pt was noted to have an empty bottle of Robaxin and Gabapentin.  She was intubated immediately upon arriving to ER and has not really awakened much for the past 9 days.  Family is at bedside and do note that she is doing more but not following.  When she was down, she was in the current state.  There has not been any seizure activity noted.  There is suspected drug and EtOH withdrawal.  ROS:  Review of Systems   unobtainable secondary to above  Past Medical/Surgical Hx:  H Pylori: h/o  GERD - Esophageal Reflux:   Tobacco Use:   Cyst on Kidney:   kidney stones:   Menginoma: behind left eye  chronic back pain:   mastitis:   migraines:   Multiple breast surgeries due to infection:   bil milk duct removal:   cervical cone biopsy:   Past Medical/ Surgical Hx:  Past Medical History reviewed by me as above   Past Surgical History reviewed by me as above   Home Medications: Medication Instructions Last Modified Date/Time  traMADol 50 mg oral tablet 1 tab(s) orally every 6 hours, As Needed - for Pain 03-Feb-16 15:44  gabapentin 300 mg oral capsule 1 cap(s) orally 2 times a day 03-Feb-16 15:44  methocarbamol 750 mg oral tablet 1 tab(s) orally 1 to 2 times a day, As Needed for muscle spasms.  03-Feb-16 15:44   Allergies:  Demerol HCl: N/V/Diarrhea, Itching  Codeine: N/V/Diarrhea  Allergies:  Allergies NKDA   Social/Family History: Employment Status: unemployed  Lives With: parents  Living Arrangements: apartment  Social  History: + tob, heavy EtOH, apparent cocaine abuse  Family History: no seizures, no strokes   Vital Signs: **Vital Signs.:   12-Feb-16 07:30  Vital Signs Type Routine  Temperature Temperature (F) 98.5  Celsius 36.9  Temperature Source oral  Pulse Pulse 88  Respirations Respirations 16  Systolic BP Systolic BP 85  Diastolic BP (mmHg) Diastolic BP (mmHg) 57  Mean BP 66  Pulse Ox % Pulse Ox % 95  Pulse Ox Activity Level  At rest  Oxygen Delivery Ventilator Assisted    13:00  Vital Signs Type Routine  Pulse Pulse 81  Respirations Respirations 16  Systolic BP Systolic BP 350  Diastolic BP (mmHg) Diastolic BP (mmHg) 69  Mean BP 81  Pulse Ox % Pulse Ox % 96  Oxygen Delivery Ventilator Assisted   Physical Exam: General: overweight, moderate distress  HEENT: normocephalic, sclera nonicteric, oropharynx clear  Neck: supple, no JVD, no bruits  Chest: CTA B, no wheezing, good movement on vent  Cardiac: RRR, no murmurs, no edema, 2+ pulses  Extremities: no C/C/E, FROM   Neurologic Exam: Mental Status: intubated, sedated, opens to voice and tracks, does not follow commands  Cranial Nerves: pupils 61m B and reactive, weak corneals B, EOMI, good cough  Motor Exam: localizes all 4 ext but less in R UE, nl tone  Deep Tendon Reflexes: 3+/4 B, mute Plantars  Sensory Exam: grimaces to pain in all 4 ext  Coordination:  unobtainable secondary to condition   Lab Results: Thyroid:  03-Feb-16 15:21   Thyroid Stimulating Hormone  0.440 (0.45-4.50 (IU = International Unit)  ----------------------- Pregnant patients have  different reference  ranges for TSH:  - - - - - - - - - -  Pregnant, first trimetser:  0.36 - 2.50 uIU/mL)  Hepatic:  05-Feb-16 04:15   Bilirubin, Total 0.4  Alkaline Phosphatase 61  SGPT (ALT) 46  SGOT (AST) 27  Total Protein, Serum  5.4  Albumin, Serum  2.5  Routine Micro:  06-Feb-16 08:30   Organism Name STREPTOCOCCUS PNEUMONIAE  Organism Quantity HEAVY  GROWTH  Ceftriaxone Sensitivity S/0.006  Oxacillin Sensitivity S  Erythromycin Sensitivity S/0.094  Vancomycin Sensitivity S/0.25  Levofloxacin Sensitivity S/0.50  Micro Text Report SPUTUM CULTURE   ORGANISM 1                HEAVY GROWTH STREPTOCOCCUS PNEUMONIAE   COMMENT                   -   GRAM STAIN                GOOD SPECIMEN-80-90% WBC   GRAM STAIN                MODERATE WHITE BLOOD CELLS   GRAM STAIN MANY GRAM POSITIVE DIPLOCOCCI RARE GRAM NEGATIVE ROD   GRAM STAIN                FEW GRAM POSITIVE COCCI IN CLUSTERS   ANTIBIOTIC                    ORG#1     CEFTRIAXONE                   S/0.006   ERYTHROMYCIN                  S/0.094   LEVOFLOXACIN                  S/0.50    OXACILLIN                     S         VANCOMYCIN                    S/0.25  Specimen Source ETS  Organism 1 HEAVY GROWTH STREPTOCOCCUS PNEUMONIAE  Culture Comment -  Gram Stain 1 GOOD SPECIMEN-80-90% WBC  Gram Stain 2 MODERATE WHITE BLOOD CELLS  Gram Stain 3 MANY GRAM POSITIVE DIPLOCOCCI RARE GRAM NEGATIVE ROD  Gram Stain 4 FEW GRAM POSITIVE COCCI IN CLUSTERS  Result(s) reported on 04 May 2014 at 11:52AM.  General Ref:  03-Feb-16 15:21   Acetaminophen, Serum < 2.0 (10-30 POTENTIALLY TOXIC:  > 200 mcg/mL  > 50 mcg/mL at 12 hr after  ingestion  > 300 mcg/mL at 4 hr after  ingestion)  Salicylates, Serum 2.3 (0.0-2.8 Therapeutic 2.8-20.0 mg/dL Toxic >30.0 mg/dL)  Routine Chem:  03-Feb-16 15:21   Ethanol, S. 220 (Result(s) reported on 28 Apr 2014 at 04:08PM.)  11-Feb-16 10:01   Result Comment CBC - This specimen was collected through an   - indwelling catheter or arterial line.  - A minimum of 60ms of blood was wasted prior    - to collecting the sample.  Interpret  - results with caution.  Result(s) reported on 06 May 2014 at 10:14AM.  Glucose, Serum  103  BUN 10  Creatinine (comp)  0.53  Sodium, Serum 140  Potassium, Serum 3.8  Chloride, Serum 102  CO2, Serum  35  Calcium (Total),  Serum 9.2  Anion Gap  3  Osmolality (calc) 279  eGFR (African American) >60  eGFR (Non-African American) >60 (eGFR values <6m/min/1.73 m2 may be an indication of chronic kidney disease (CKD). Calculated eGFR, using the MRDR Study equation, is useful in  patients with stable renal function. The eGFR calculation will not be reliable in acutely ill patients when serum creatinine is changing rapidly. It is not useful in patients on dialysis. The eGFR calculation may not be applicable to patients at the low and high extremes of body sizes, pregnant women, and vegetarians.)  Magnesium, Serum 1.8 (1.8-2.4 THERAPEUTIC RANGE: 4-7 mg/dL TOXIC: > 10 mg/dL  -----------------------)  Phosphorus, Serum 3.5 (Result(s) reported on 06 May 2014 at 10:31AM.)  Urine Drugs:  [id number]  Tricyclic Antidepressant, Ur Qual (comp) NEGATIVE (Result(s) reported on 28 Apr 2014 at 04:04PM.)  Amphetamines, Urine Qual. NEGATIVE  MDMA, Urine Qual. NEGATIVE  Cocaine Metabolite, Urine Qual. POSITIVE  Opiate, Urine qual NEGATIVE  Phencyclidine, Urine Qual. NEGATIVE  Cannabinoid, Urine Qual. NEGATIVE  Barbiturates, Urine Qual. NEGATIVE  Benzodiazepine, Urine Qual. NEGATIVE (----------------- The URINE DRUG SCREEN provides only a preliminary, unconfirmed analytical test result and should not be used for non-medical  purposes.  Clinical consideration and professional judgment should be  applied to any positive drug screen result due to possible interfering substances.  A more specific alternate chemical method must be used in order to obtain a confirmed analytical result.  Gas chromatography/mass spectrometry (GC/MS) is the preferred confirmatory method.)  Methadone, Urine Qual. NEGATIVE  Routine UA:  03-Feb-16 15:34   Color (UA) Straw  Clarity (UA) Clear  Glucose (UA) Negative  Bilirubin (UA) Negative  Ketones (UA) Negative  Specific Gravity (UA) 1.013  Blood (UA) Negative  pH (UA) 6.0  Protein  (UA) Negative  Nitrite (UA) Negative  Leukocyte Esterase (UA) Negative (Result(s) reported on 28 Apr 2014 at 04:07PM.)  RBC (UA) 2 /HPF  WBC (UA) 1 /HPF  Bacteria (UA) NONE SEEN  Epithelial Cells (UA) NONE SEEN  Mucous (UA) PRESENT  Hyaline Cast (UA) 12 /LPF (Result(s) reported on 28 Apr 2014 at 04:07PM.)  Routine Hem:  11-Feb-16 10:01   WBC (CBC) 9.0  RBC (CBC)  3.48  Hemoglobin (CBC)  11.0  Hematocrit (CBC)  32.8  Platelet Count (CBC) 363  MCV 94  MCH 31.6  MCHC 33.6  RDW 13.8  Neutrophil % 55.6  Lymphocyte % 30.6  Monocyte % 9.7  Eosinophil % 3.7  Basophil % 0.4  Neutrophil # 5.0  Lymphocyte # 2.7  Monocyte # 0.9  Eosinophil # 0.3  Basophil # 0.0   Radiology Impression: Radiology Impression: CT of head personally reviewed by me and appears normal   Impression/Recommendations: Recommendations:   prior notes reviewed by me reviewed by me   Encephalopathy-  this appears to be medication related but can not rule out underlying seizures or other comfounders such as nutritional.  Does not really appear to be toxic metabolic but could be infectious as well. Hx of menigioma-  not seen by me on CT EEG check B12/folate, LFTs, ammonia, TSH, ESR weaning Fentanyl today ok to continue oxycodone add Thiamime 3026mIV x 3 days ok to use propofol for sedation will try to wean Neurotin as well may add some neurostimulation if above w/u is negative will follow  Electronic Signatures: SmValora Corporal  C (MD)  (Signed 12-Feb-16 14:34)  Authored: REFERRING PHYSICIAN, Primary Care Physician, Consult, History of Present Illness, Review of Systems, PAST MEDICAL/SURGICAL HISTORY, HOME MEDICATIONS, ALLERGIES, Social/Family History, NURSING VITAL SIGNS, Physical Exam-, LAB RESULTS, RADIOLOGY RESULTS, Recommendations   Last Updated: 12-Feb-16 14:34 by Jamison Neighbor (MD)

## 2014-07-25 NOTE — Consult Note (Signed)
Psychiatry: Follow-up for this patient status post suicide attempt by overdose.  Spoke with nursing and evaluated patient.  Spoke with hospitalist this morning.  Patient was asleep and not arousable for conversation.  Nursing tells me that she still was not able to stand unassisted and was only able to eat small amounts of food. to offer review of systems. is asleep and not arousable. to get further data.  I continued to assume that she will benefit from transferred to psychiatry once she is able to stand and feed herself and participate.  I will continue to follow-up.  No change to treatment plan for now.  Electronic Signatures: Ziaire Hagos, Madie Reno (MD)  (Signed on 17-Feb-16 18:36)  Authored  Last Updated: 17-Feb-16 18:36 by Gonzella Lex (MD)

## 2014-07-25 NOTE — Discharge Summary (Signed)
PATIENT NAME:  Shirley Evans, Shirley Evans MR#:  [id number] DATE OF BIRTH:  [birth year]  DATE OF ADMISSION:  04/28/2014 DATE OF DISCHARGE:  05/14/2014  ADMITTING DIAGNOSES: 1.  Polysubstance overdose with suicidal ideation.  2.  Dehydration with hypernatremia, hypokalemia, and acute acidosis, probably metabolic.   CONSULTATIONS: Psychiatry, Dr. Weber Cooks; neurology, Dr. Tamala Julian; pulmonology/critical care.   This is an addendum to discharge summary dictated by Dr. Max Sane on February 12th. This summary will cover from February 13th through February 19th.   DISCHARGE DIAGNOSES:  1.  Drug overdose.  2.  Acute hypoxic respiratory failure.  3.  Toxic metabolic encephalopathy.  4.  Severe depression with suicidal attempt.  5.  Polysubstance abuse with alcohol and cocaine. 6.  Probably aspiration pneumonia.  PROCEDURES: Lumbar puncture, February 14th.  HOSPITAL COURSE: Acute respiratory failure, vent dependent. The patient was intubated initially, got extubated and reintubated on 05/03/2014, followed up by pulmonology. Weaning trials were made and the patient was finally extubated on February 16th. The patient's encephalopathy was thought to be from autoimmune process given the elevated thyroglobulin. EEG showed some improvement. The meningioma was thought to be stable on CAT scan. Because of the initial EEG revealing right temporal lobe suppression, which can be from mass versus stroke versus infection or postictal, they have recommended Solu-Medrol 500 mg q. 12 hours for 3 days, which was given during the hospital course. They also have recommended Vimpat, low dose, 100 mg b.i.d. as there was a concern for seizures. As recommended by neurology, the patient was also given thiamine 300 mg IV for 3 days and recommended to give the patient oxycodone as needed basis. Following extubation, bedside swallow evaluation was done and there was a concern for aspiration. Speech pathology has done modified barium swallow  which has revealed silent aspiration. The patient was placed on stage 3 dysphagia diet with honey thick liquids as recommended by speech pathology, which was tolerated by the patient very well. Physical therapy was consulted for deconditioning. Initially speech was garbled which was thought to be from laryngeal edema from the intubation. With Solu-Medrol her edema significantly improved and the patient's speech was much better. Following extubation, the patient was put on one-on-one basis as she was IVC for drug overdose.   Drug overdose is probably intentional, evaluated by Dr. Weber Cooks, who has recommended to transfer the patient to psychiatry floor as there was a concern for severe depression as the patient was almost successful with suicidal attempt with drug overdose. On February 19th, the patient was doing much better, was able to eat and tolerate food and ambulating with the help of walker. As the patient was medically stable, she was transferred to psych floor to Dr. Weber Cooks' service, on February 19th, in stable condition.   The patient's encephalopathy was dissolved; however, as there was a concern for seizure, Vimpat was continued, as recommended by neurology. The patient had lumbar puncture done on feb 14th. No signs of infection were noted on CSF so Rocephin and acyclovir were discontinued. The patient was treated with 3 doses IV thiamine, will continue Vimpat, IV Solu-Medrol first dose was continued and it was discontinued after the course of 3 days.   The patient was medically stable at the time of transfer to psychiatry floor, to Dr. Weber Cooks' service.  DISCHARGE MEDICATIONS: Aluminum hydroxide/magnesium hydroxide/simethicone 30 mL p.o. every 6 hours as needed for dyspepsia, Tylenol 325 mg 2 tablets every 4 hours as needed for mild pain or temperature greater than 100.4, lacosamide 50 mg  1 tablet p.o. every 12 hours, quetiapine 50 mg 1 tablet p.o. at bedtime, thiamin 100 mg p.o. once daily,  folic acid 1 mg p.o. once daily, multivitamin 1 tablet p.o. once daily, famotidine 20 mg p.o. 2 times a day, albuterol ipratropium inhalation 4 times a day, amantadine 100 mg 1 capsule p.o. q. 12 hours, Ambien 5 mg p.o. at bedtime as needed for insomnia, nystatin swish and swallow 5 mL every 6 hours for 5 days and then stop. Stop medications: Gabapentin, methocarbamol and tramadol.   DISCHARGE DIET: Regular diet, mechanical soft with honey thick liquids with aspiration precautions, swallow 2 times with each bolus to clear pharynx, medication should be given in puree.  DISCHARGE ACTIVITY: As tolerated with physical therapy.  DISCHARGE INSTRUCTIONS: Follow up with primary care physician in 2 weeks after discharge. Follow up with psychiatry as recommended by psychiatrist, Dr. Weber Cooks. Follow up with neurology in 1 to 2 weeks following discharge from psychiatry floor.   The diagnosis and plan of care was discussed in detail with the patient, her mom and boyfriend at bedside. They all verbalized understanding of the plan.  DIAGNOSTIC DATA: TSH was normal at 1.32 on 17th of February. Potassium 3.82. Chloride, CO2 and anion gap are normal. GFR greater than 60. Phosphorus 2.2. Magnesium 2.2. BUN 19 and creatinine 0.65. CBC is normal.  TIME SPENT ON DISCHARGE AND COORDINATION OF CARE: 45 minutes.   ____________________________ Nicholes Mango, MD ag:sb D: 05/19/2014 23:15:00 ET T: 05/20/2014 09:24:34 ET JOB#: [id number]  cc: Nicholes Mango, MD, <Dictator> Primary Care Physician Gonzella Lex, MD Nicholes Mango MD ELECTRONICALLY SIGNED 05/21/2014 15:07

## 2014-08-06 ENCOUNTER — Other Ambulatory Visit: Payer: Self-pay | Admitting: Nurse Practitioner

## 2014-08-06 DIAGNOSIS — R1011 Right upper quadrant pain: Secondary | ICD-10-CM

## 2014-08-11 ENCOUNTER — Ambulatory Visit
Admission: RE | Admit: 2014-08-11 | Discharge: 2014-08-11 | Disposition: A | Discharge status: Home / Self Care | Payer: 59 | Source: Ambulatory Visit | Attending: Nurse Practitioner | Admitting: Nurse Practitioner

## 2014-08-11 DIAGNOSIS — K760 Fatty (change of) liver, not elsewhere classified: Secondary | ICD-10-CM | POA: Diagnosis not present

## 2014-08-11 DIAGNOSIS — R1011 Right upper quadrant pain: Secondary | ICD-10-CM

## 2014-08-31 ENCOUNTER — Ambulatory Visit: Payer: Self-pay | Admitting: Psychiatry

## 2014-08-31 ENCOUNTER — Encounter: Payer: Self-pay | Admitting: Obstetrics and Gynecology

## 2014-10-08 ENCOUNTER — Encounter: Admission: RE | Payer: Self-pay | Source: Ambulatory Visit

## 2014-10-08 ENCOUNTER — Ambulatory Visit: Admission: RE | Admit: 2014-10-08 | Payer: 59 | Source: Ambulatory Visit | Admitting: Unknown Physician Specialty

## 2014-10-08 SURGERY — COLONOSCOPY WITH PROPOFOL
Anesthesia: General

## 2015-06-22 ENCOUNTER — Ambulatory Visit: Payer: 59 | Attending: Oncology

## 2015-06-22 ENCOUNTER — Ambulatory Visit
Admission: RE | Admit: 2015-06-22 | Discharge: 2015-06-22 | Disposition: A | Discharge status: Home / Self Care | Payer: Self-pay | Source: Ambulatory Visit | Attending: Oncology | Admitting: Oncology

## 2015-06-22 VITALS — BP 150/89 | HR 80 | Temp 97.7°F | Ht 65.35 in | Wt 178.5 lb

## 2015-06-22 DIAGNOSIS — Z0000 Encounter for general adult medical examination without abnormal findings: Secondary | ICD-10-CM

## 2015-06-22 HISTORY — DX: Malignant (primary) neoplasm, unspecified: C80.1

## 2015-06-22 NOTE — Progress Notes (Signed)
Subjective:     Patient ID: Shirley Evans, female   DOB: 06/27/1965, 50 y.o.   MRN: [id number]  HPI   Review of Systems     Objective:   Physical Exam  Pulmonary/Chest: Right breast exhibits inverted nipple. Right breast exhibits no mass, no nipple discharge, no skin change and no tenderness. Left breast exhibits no inverted nipple, no mass, no nipple discharge, no skin change and no tenderness. Breasts are symmetrical.         Assessment:     50 year old patient presents for [facility] clinic visit.  Patient screened, and meets BCCCP eligibility.  Patient does not have insurance, Medicare or Medicaid. Worked for 20 years at [employer], but let go due to multiple health concerns.  Handout given on Affordable Care Act.  Instructed patient on breast self-exam , and importance of annual mammogram using teach back method.  CBE unremarkable.  No mass or lump palpated. Patient reports right nipple has been inverted since she was 17.  Was followed by Dr. Pat Patrick for bilateral breast abscess.  Last pap 2013.  Pelvic exam normal. 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  She also reports a 20 pound weight gain since quitting smoking.    Plan:     Sent for bilateral screening mammogram.  Specimen collected for pap.

## 2015-06-24 NOTE — Progress Notes (Signed)
Letter mailed from [facility] to notify of normal mammogram results.  Patient to return in one year for annual screening.  Pap results pending. 

## 2015-06-25 LAB — PAP LB AND HPV HIGH-RISK
HPV, high-risk: NEGATIVE
PAP SMEAR COMMENT: 0

## 2015-06-27 NOTE — Progress Notes (Signed)
Letter mailed with normal mammogram, and pap results.  Next pap due in 5 years.

## 2015-08-05 IMAGING — CR DG CHEST 2V
2 series · 2 of 2 positions shown · non-contrast
Comparison: 05/03/2014

CLINICAL DATA: Cough

EXAM:
CHEST  2 VIEW

[chest pa]
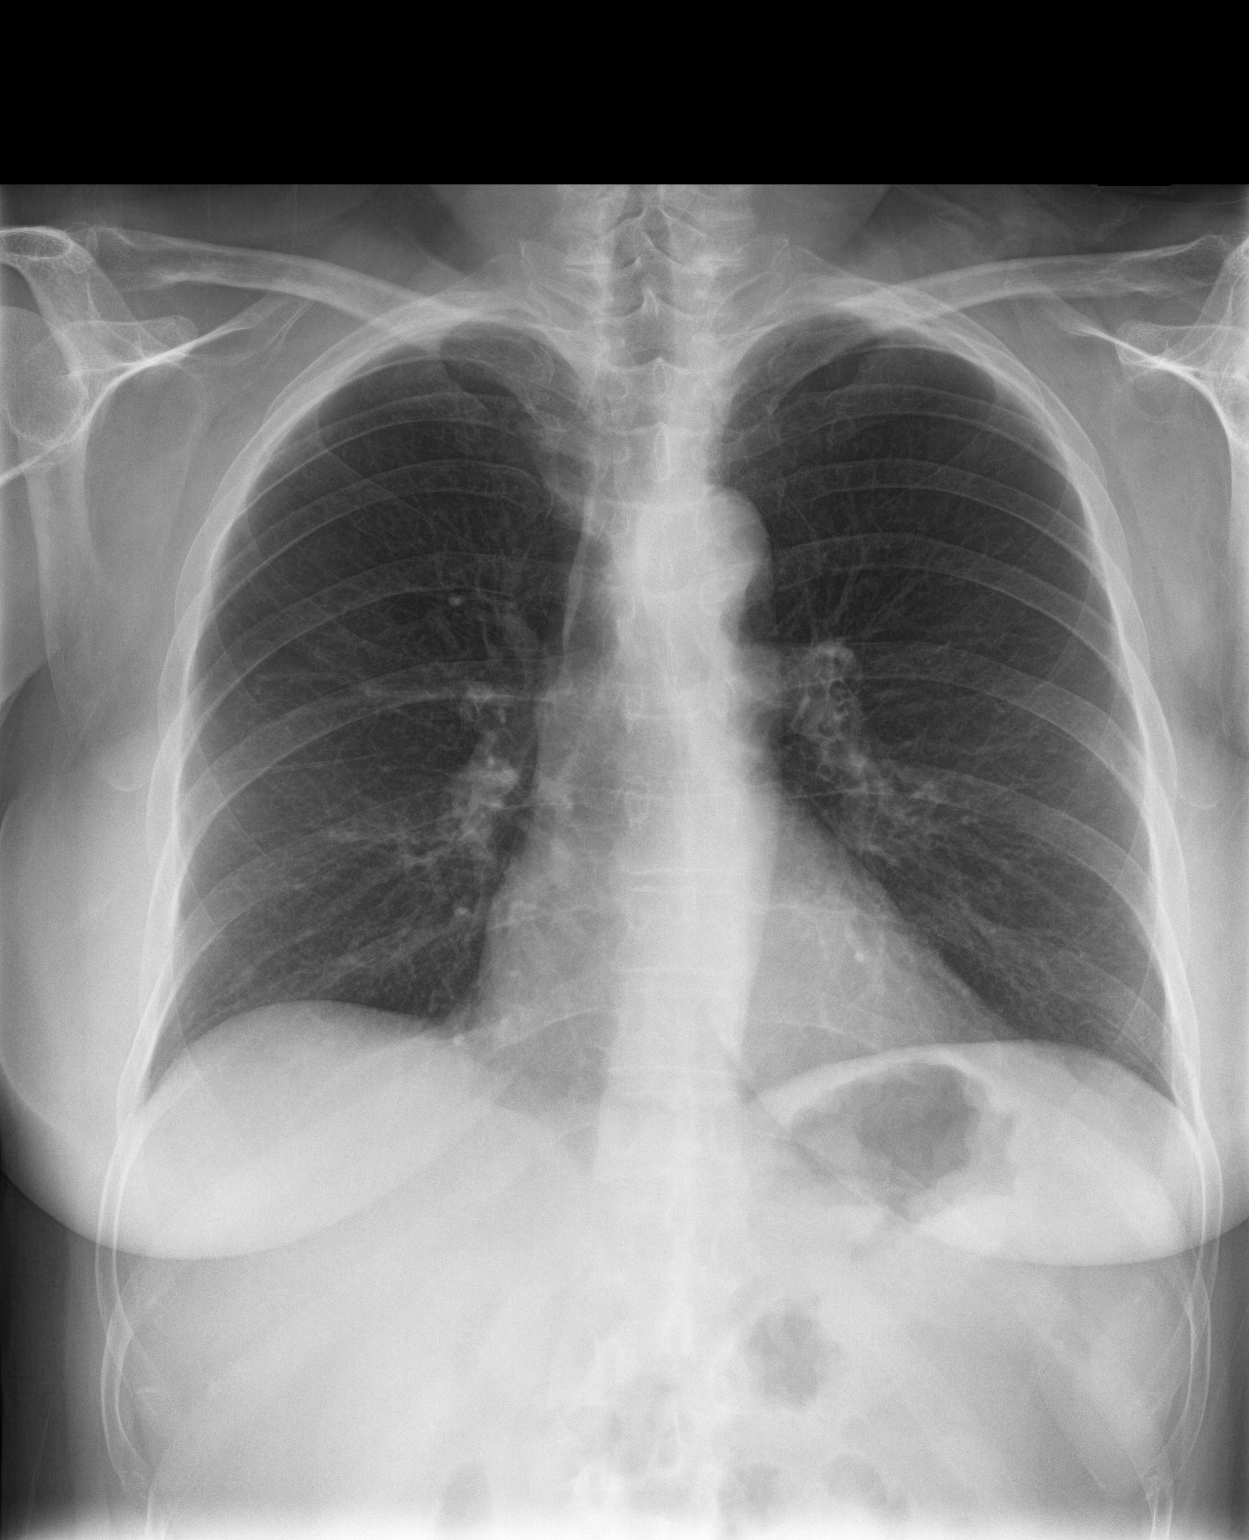

[chest lat]
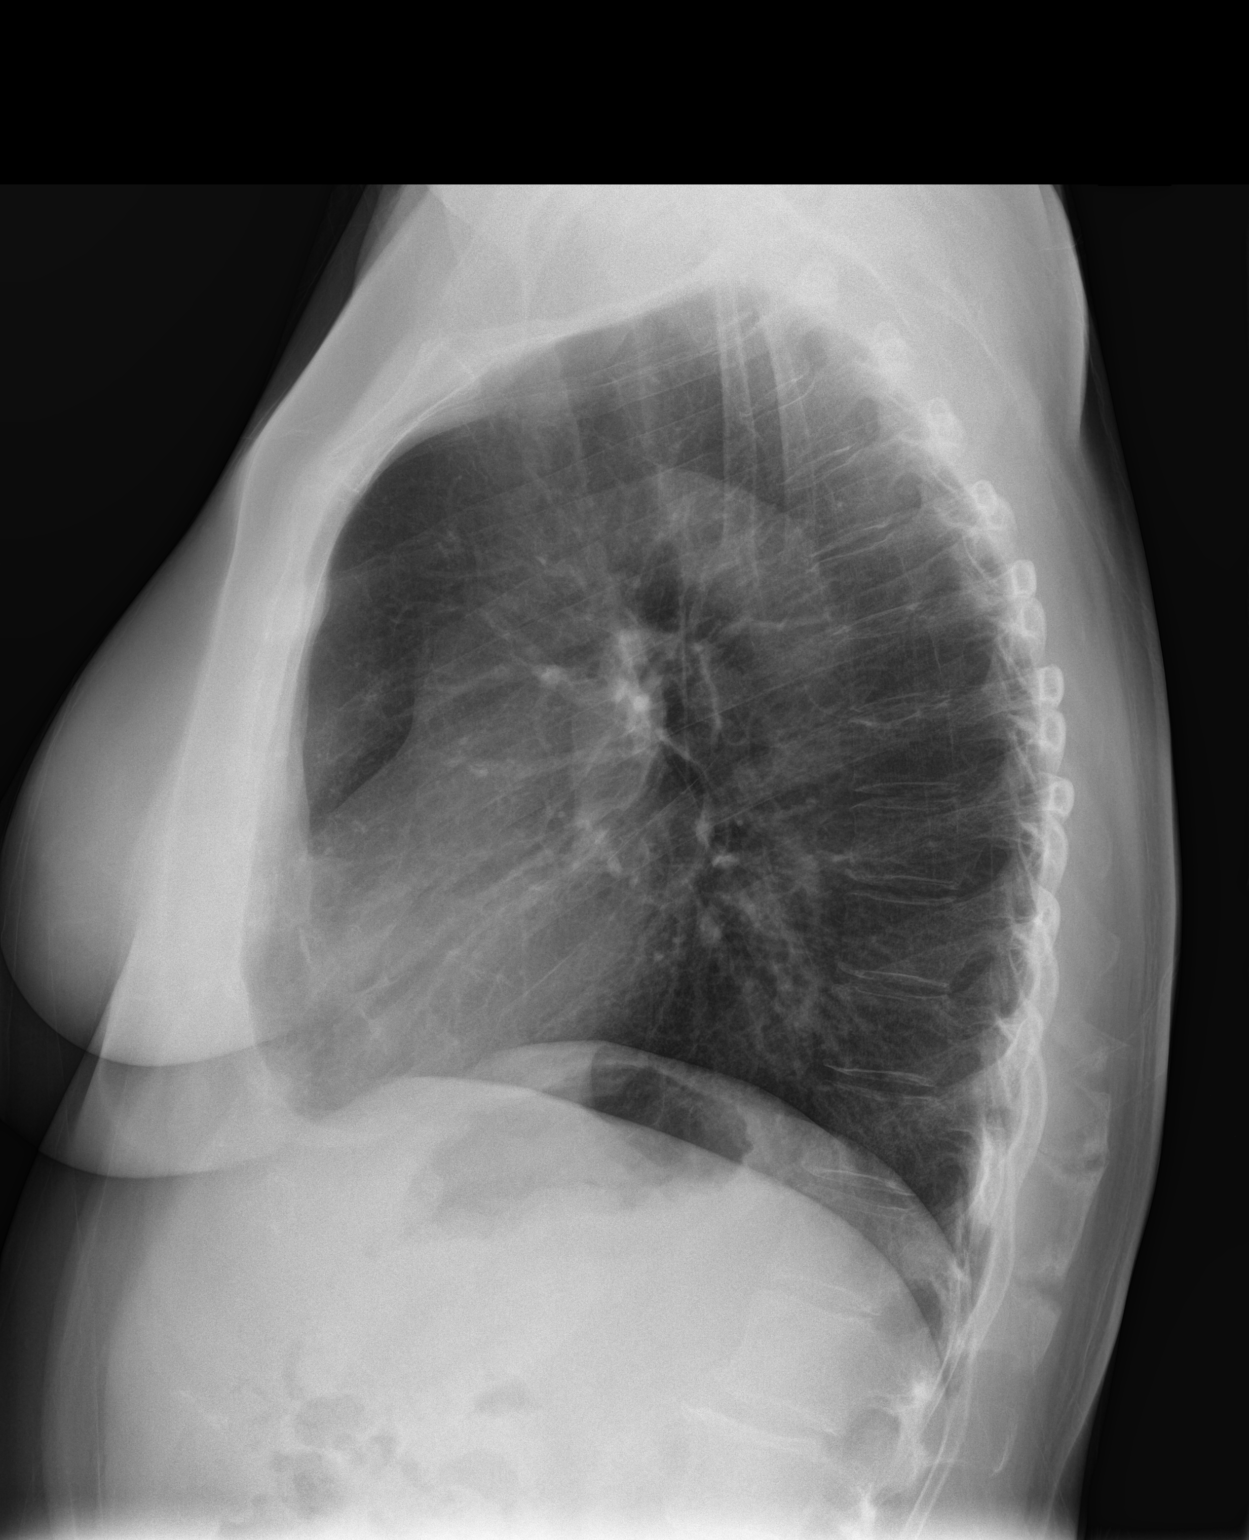

[2 of 2 positions shown; findings below may reference images not displayed]

FINDINGS: The heart size and mediastinal contours are within normal limits.
Both lungs are clear. The visualized skeletal structures are
unremarkable.
IMPRESSION: No active cardiopulmonary disease.

## 2015-11-25 ENCOUNTER — Ambulatory Visit
Admission: RE | Admit: 2015-11-25 | Discharge: 2015-11-25 | Disposition: A | Discharge status: Home / Self Care | Payer: Self-pay | Source: Ambulatory Visit | Attending: Family Medicine | Admitting: Family Medicine

## 2015-11-25 ENCOUNTER — Ambulatory Visit
Admission: RE | Admit: 2015-11-25 | Discharge: 2015-11-25 | Disposition: A | Discharge status: Home / Self Care | Payer: 59 | Source: Ambulatory Visit | Attending: Family Medicine | Admitting: Family Medicine

## 2015-11-25 ENCOUNTER — Other Ambulatory Visit: Payer: Self-pay | Admitting: Family Medicine

## 2015-11-25 DIAGNOSIS — R0602 Shortness of breath: Secondary | ICD-10-CM

## 2015-11-25 DIAGNOSIS — R0600 Dyspnea, unspecified: Secondary | ICD-10-CM

## 2015-11-25 DIAGNOSIS — I700 Atherosclerosis of aorta: Secondary | ICD-10-CM | POA: Insufficient documentation

## 2016-08-01 ENCOUNTER — Ambulatory Visit
Admission: RE | Admit: 2016-08-01 | Discharge: 2016-08-01 | Disposition: A | Discharge status: Home / Self Care | Payer: Self-pay | Source: Ambulatory Visit | Attending: Oncology | Admitting: Oncology

## 2016-08-01 ENCOUNTER — Ambulatory Visit: Payer: Self-pay | Attending: Oncology

## 2016-08-01 VITALS — BP 100/69 | HR 111 | Temp 97.1°F | Ht 64.0 in | Wt 161.0 lb

## 2016-08-01 DIAGNOSIS — Z0000 Encounter for general adult medical examination without abnormal findings: Secondary | ICD-10-CM

## 2016-08-01 NOTE — Progress Notes (Signed)
Subjective:     Patient ID: Shirley Evans, female   DOB: 05-Apr-1965, 51 y.o.   MRN: [id number]  HPI   Review of Systems     Objective:   Physical Exam  Pulmonary/Chest: Right breast exhibits tenderness. Right breast exhibits no inverted nipple, no mass, no nipple discharge and no skin change. Left breast exhibits tenderness. Left breast exhibits no inverted nipple, no mass, no nipple discharge and no skin change. Breasts are symmetrical.    Bilateral  Symmetrical intermittent breast tenderness outer breasts.  Bilateral nipple inversion due to previous surgeries.       Assessment:     51 year old patient presents for [facility] clinic visit.  Patient had normal mammogram, and negative/negative pap in 2017.  States her heart rate has been elevated, along with shortness of breath, States she is going to [facility] for [facility] test tomorrow.  Patient screened, and meets BCCCP eligibility.  Patient does not have insurance, Medicare or Medicaid.  Handout given on Affordable Care Act. Instructed patient on breast self-exam using teach back method. CBE unremarkable.      Plan:     Sent for bilateral screening mammogram.

## 2016-08-03 NOTE — Progress Notes (Signed)
Letter mailed from [facility] to notify of normal mammogram results.  Patient to return in one year for annual screening.  Copy to [facility]. 

## 2017-08-28 ENCOUNTER — Ambulatory Visit: Payer: Self-pay | Attending: Oncology

## 2017-08-28 ENCOUNTER — Ambulatory Visit
Admission: RE | Admit: 2017-08-28 | Discharge: 2017-08-28 | Disposition: A | Discharge status: Home / Self Care | Payer: Self-pay | Source: Ambulatory Visit | Attending: Oncology | Admitting: Oncology

## 2017-08-28 ENCOUNTER — Encounter (INDEPENDENT_AMBULATORY_CARE_PROVIDER_SITE_OTHER): Payer: Self-pay

## 2017-08-28 VITALS — BP 137/94 | HR 101 | Temp 96.9°F | Resp 20 | Ht 65.28 in | Wt 166.0 lb

## 2017-08-28 DIAGNOSIS — Z0000 Encounter for general adult medical examination without abnormal findings: Secondary | ICD-10-CM | POA: Insufficient documentation

## 2017-08-28 NOTE — Progress Notes (Signed)
  Subjective:     Patient ID: Shirley Evans, female   DOB: Jan 01, 1966, 52 y.o.   MRN: [id number]  HPI   Review of Systems     Objective:   Physical Exam  Pulmonary/Chest: Right breast exhibits no inverted nipple, no mass, no nipple discharge, no skin change and no tenderness. Left breast exhibits no inverted nipple, no mass, no nipple discharge, no skin change and no tenderness.  Lesions lower outer right breast.         Assessment:     52 year old patient presents for [facility] clinic visit.  Patient screened, and meets BCCCP eligibility.  Patient does not have insurance, Medicare or Medicaid.  Handout given on Affordable Care Act.  Instructed patient on breast self awareness using teach back method.  Clinical breast exam unremarkable except for lesions right lower outer breast.  Patient states " a bug got in my sports bra. "  She also reports she is scheduled for a lung CT tomorrow at [facility], and has an MRI scheduled for pack pain.  States she is trying to get disability.      Plan:     Sent for bilateral screening mammogram.

## 2017-08-28 NOTE — Progress Notes (Signed)
Letter mailed from [facility] to notify of normal mammogram results.  Patient to return in one year for annual screening.  Copy to [facility]. 

## 2019-08-13 ENCOUNTER — Encounter: Payer: Self-pay | Admitting: Emergency Medicine

## 2019-08-13 ENCOUNTER — Other Ambulatory Visit: Payer: Self-pay

## 2019-08-13 ENCOUNTER — Emergency Department
Admission: EM | Admit: 2019-08-13 | Discharge: 2019-08-13 | Disposition: A | Discharge status: Home / Self Care | Payer: Medicare Other | Attending: Emergency Medicine | Admitting: Emergency Medicine

## 2019-08-13 DIAGNOSIS — Z87891 Personal history of nicotine dependence: Secondary | ICD-10-CM | POA: Diagnosis not present

## 2019-08-13 DIAGNOSIS — R4182 Altered mental status, unspecified: Secondary | ICD-10-CM | POA: Diagnosis present

## 2019-08-13 DIAGNOSIS — Z79899 Other long term (current) drug therapy: Secondary | ICD-10-CM | POA: Diagnosis not present

## 2019-08-13 DIAGNOSIS — Y908 Blood alcohol level of 240 mg/100 ml or more: Secondary | ICD-10-CM | POA: Diagnosis not present

## 2019-08-13 DIAGNOSIS — F10920 Alcohol use, unspecified with intoxication, uncomplicated: Secondary | ICD-10-CM | POA: Insufficient documentation

## 2019-08-13 LAB — COMPREHENSIVE METABOLIC PANEL
ALT: 84 U/L — ABNORMAL HIGH (ref 0–44)
AST: 111 U/L — ABNORMAL HIGH (ref 15–41)
Albumin: 4.5 g/dL (ref 3.5–5.0)
Alkaline Phosphatase: 62 U/L (ref 38–126)
Anion gap: 14 (ref 5–15)
BUN: 19 mg/dL (ref 6–20)
CO2: 27 mmol/L (ref 22–32)
Calcium: 9.6 mg/dL (ref 8.9–10.3)
Chloride: 95 mmol/L — ABNORMAL LOW (ref 98–111)
Creatinine, Ser: 0.86 mg/dL (ref 0.44–1.00)
GFR calc Af Amer: >60 mL/min (ref >60)
GFR calc non Af Amer: >60 mL/min (ref >60)
Glucose, Bld: 146 mg/dL — ABNORMAL HIGH (ref 70–99)
Potassium: 3.2 mmol/L — ABNORMAL LOW (ref 3.5–5.1)
Sodium: 136 mmol/L (ref 135–145)
Total Bilirubin: 0.8 mg/dL (ref 0.3–1.2)
Total Protein: 7.6 g/dL (ref 6.5–8.1)

## 2019-08-13 LAB — CBC
HCT: 39.1 % (ref 36.0–46.0)
Hemoglobin: 14.1 g/dL (ref 12.0–15.0)
MCH: 32.9 pg (ref 26.0–34.0)
MCHC: 36.1 g/dL — ABNORMAL HIGH (ref 30.0–36.0)
MCV: 91.4 fL (ref 80.0–100.0)
Platelets: 291 10*3/uL (ref 150–400)
RBC: 4.28 MIL/uL (ref 3.87–5.11)
RDW: 12 % (ref 11.5–15.5)
WBC: 8.6 10*3/uL (ref 4.0–10.5)
nRBC: 0 % (ref 0.0–0.2)

## 2019-08-13 LAB — ETHANOL: Alcohol, Ethyl (B): 382 mg/dL (ref <10)

## 2019-08-13 MED ORDER — LORAZEPAM 2 MG/ML IJ SOLN
1.0000 mg | Freq: Once | INTRAMUSCULAR | Status: AC
  Administered 2019-08-13: 1 mg via INTRAVENOUS

## 2019-08-13 MED ORDER — LORAZEPAM 2 MG/ML IJ SOLN
INTRAMUSCULAR | Status: AC
  Administered 2019-08-13: 1 mg via INTRAVENOUS
  Filled 2019-08-13: qty 1

## 2019-08-13 MED ORDER — LACTATED RINGERS IV BOLUS
1000.0000 mL | Freq: Once | INTRAVENOUS | Status: AC
  Administered 2019-08-13: 1000 mL via INTRAVENOUS

## 2019-08-13 MED ORDER — LORAZEPAM 2 MG/ML IJ SOLN: 1.0000 mg | Freq: Once | INTRAMUSCULAR | Status: AC

## 2019-08-13 NOTE — ED Provider Notes (Signed)
-----------------------------------------   3:06 PM on 08/13/2019 -----------------------------------------  Blood pressure (!) 138/104, pulse (!) 119, temperature 99.4 F (37.4 C), temperature source Oral, resp. rate 18, height 5\' 4"  (1.626 m), weight 77.1 kg, SpO2 92 %.  Assuming care from Dr. Corky Downs.  In short, Shirley Evans is a 54 y.o. female with a chief complaint of Altered Mental Status .  Refer to the original H&P for additional details.  The current plan of care is to observe until clinically sober or patient has a safe ride home.  ----------------------------------------- 9:00 PM on 08/13/2019 -----------------------------------------  Patient now more awake and alert, acting appropriately.  Her mother is coming to the ED to pick her up and provide a safe ride home.  She is appropriate for discharge home at this time.    Blake Divine, MD 08/13/19 2101

## 2019-08-13 NOTE — ED Notes (Signed)
Pt deemed safe for dc if has ride with parent. Mom came to pick patient up, patient ambulated out door and tolerated well.

## 2019-08-13 NOTE — ED Notes (Signed)
Pt having tremor like movement and is uncooperative with this RN for vitals and blood work. Pt keeps stating 'I need water' and 'I need to call my momma'. Pt threatening to leave if we don't call her momma. This RN attempted to call patient's mom on both numbers with no answer. Pt given phone to call mom herself, but pt unable to dial due to tremor.   Marya Amsler, charge RN and Dr. Corky Downs at bedside. Pt refused to take ativan, but then agree's. This RN keeps attempting to call mother, mother answers then is able to come to bed side.

## 2019-08-13 NOTE — ED Provider Notes (Signed)
[facility] Emergency Department Provider Note   ____________________________________________    I have reviewed the triage vital signs and the nursing notes.   HISTORY  Chief Complaint Altered Mental Status   History limited by altered mental status.  HPI Shirley Evans is a 54 y.o. female with history as noted below who presents with altered mental status.  Per EMS patient from home, found in backyard by boyfriend, strong suspicion for alcohol use.  Patient is unable to provide any significant history.  Review of medical records demonstrates the patient does have a history of alcohol and cocaine use in the past once requiring intubation and admission. Past Medical History:  Diagnosis Date  . Cancer (HCC)    cervical  . GERD (gastroesophageal reflux disease)   . History of Helicobacter pylori infection   . Menopausal state   . Osteopenia   . Postmenopausal atrophic vaginitis     There are no problems to display for this patient.   Past Surgical History:  Procedure Laterality Date  . BREAST BIOPSY Bilateral    neg    Prior to Admission medications   Medication Sig Start Date End Date Taking? Authorizing Provider  acetaminophen (TYLENOL) 500 MG tablet Take by mouth. 04/01/19  Yes [provider]  albuterol (PROVENTIL) (2.5 MG/3ML) 0.083% nebulizer solution Inhale into the lungs. 07/09/19 07/08/20 Yes [provider]  albuterol (VENTOLIN HFA) 108 (90 Base) MCG/ACT inhaler Inhale into the lungs. 07/09/19  Yes [provider]  budesonide-formoterol (SYMBICORT) 160-4.5 MCG/ACT inhaler Inhale into the lungs. 04/01/19  Yes [provider]  buPROPion (WELLBUTRIN) 75 MG tablet Take by mouth. 06/19/19 06/18/20 Yes [provider]  cetirizine (ZYRTEC) 10 MG tablet Take by mouth. 04/01/19  Yes [provider]  cyclobenzaprine (FLEXERIL) 10 MG tablet Take by mouth. 06/19/19  Yes [provider]    dicyclomine (BENTYL) 10 MG capsule Take by mouth. 06/19/19  Yes [provider]  escitalopram (LEXAPRO) 10 MG tablet Take by mouth. 07/15/14  Yes [provider]  hydrOXYzine (ATARAX/VISTARIL) 50 MG tablet Take by mouth. 06/19/19 06/18/20 Yes [provider]  lidocaine (LIDODERM) 5 % Place onto the skin. 04/01/19  Yes [provider]  lisinopril-hydrochlorothiazide (ZESTORETIC) 20-12.5 MG tablet Take by mouth. 06/19/19  Yes [provider]  magnesium oxide (MAG-OX) 400 MG tablet Take by mouth. 04/01/19  Yes [provider]  melatonin 3 MG TABS tablet Take by mouth. 04/01/19  Yes [provider]  omeprazole (PRILOSEC) 20 MG capsule Take by mouth. 06/19/19 08/21/19 Yes [provider]  rosuvastatin (CRESTOR) 40 MG tablet Take by mouth. 06/19/19 06/18/20 Yes [provider]  sertraline (ZOLOFT) 100 MG tablet Take by mouth. 06/19/19  Yes [provider]  Aspirin-Acetaminophen 500-325 MG PACK Take by mouth.    [provider]  carisoprodol-aspirin (SOMA COMPOUND) 200-325 MG per tablet Take 1 tablet by mouth.    [provider]  Cholecalciferol 50 MCG (2000 UT) CAPS Take by mouth.    [provider]  cyanocobalamin 1000 MCG tablet Take by mouth.    [provider]  naproxen sodium (ALEVE) 220 MG tablet Take by mouth.    [provider]  ranitidine (ZANTAC) 75 MG tablet Take 75 mg by mouth daily.    [provider]  traMADol (ULTRAM-ER) 100 MG 24 hr tablet Take 100 mg by mouth.    [provider]     Allergies Demerol [meperidine] and Codeine  Family  History  Problem Relation Age of Onset  . Diabetes Mother   . Hypertension Mother   . Breast cancer Paternal Aunt 6  . Hypertension Father     Social History Social History   Tobacco Use  . Smoking status: Former Smoker    Packs/day: 1.00    Years: 30.00    Pack years: 30.00    Types: Cigarettes   Substance Use Topics  . Alcohol use: Yes    Alcohol/week: 0.0 standard drinks  . Drug use: Yes    Types: Marijuana    Comment: occasion recreational use    Level 5 caveat: Unable to obtain review of Systems     ____________________________________________   PHYSICAL EXAM:  VITAL SIGNS: ED Triage Vitals  Enc Vitals Group     BP 08/13/19 1347 (!) 138/104     Pulse Rate 08/13/19 1347 (!) 119     Resp 08/13/19 1347 18     Temp 08/13/19 1347 99.4 F (37.4 C)     Temp Source 08/13/19 1347 Oral     SpO2 08/13/19 1347 92 %     Weight 08/13/19 1338 77.1 kg (170 lb)     Height 08/13/19 1338 1.626 m (5\' 4" )     Head Circumference --      Peak Flow --      Pain Score 08/13/19 1337 0     Pain Loc --      Pain Edu? --      Excl. in [place]? --     Constitutional: Alert, agitated and somewhat aggressive Eyes: Conjunctivae are normal.  PERRLA Head: Atraumatic. Nose: No congestion/rhinnorhea. Mouth/Throat: Mucous membranes are moist.   Neck:  Painless ROM Cardiovascular: Normal rate, regular rhythm. Good peripheral circulation. Respiratory: Normal respiratory effort.  No retractions. Gastrointestinal: Soft and nontender. No distention.   Musculoskeletal: Warm and well perfused.  No evidence of injury Neurologic: Slurred speech.  Moving all extremities Skin:  Skin is warm, dry and intact. No rash noted.   ____________________________________________   LABS (all labs ordered are listed, but only abnormal results are displayed)  Labs Reviewed  COMPREHENSIVE METABOLIC PANEL - Abnormal; Notable for the following components:      Result Value   Potassium 3.2 (*)    Chloride 95 (*)    Glucose, Bld 146 (*)    AST 111 (*)    ALT 84 (*)    All other components within normal limits  CBC - Abnormal; Notable for the following components:   MCHC 36.1 (*)    All other components within normal limits  ETHANOL - Abnormal; Notable for the following components:   Alcohol, Ethyl (B)  382 (*)    All other components within normal limits   ____________________________________________  EKG  None ____________________________________________  RADIOLOGY  None ____________________________________________   PROCEDURES  Procedure(s) performed: No  Procedures   Critical Care performed: No ____________________________________________   INITIAL IMPRESSION / ASSESSMENT AND PLAN / ED COURSE  Pertinent labs & imaging results that were available during my care of the patient were reviewed by me and considered in my medical decision making (see chart for details).  Patient presents with altered mental status.  Presentation is suspicious for alcohol intoxication versus polysubstance abuse.  No evidence of trauma.  She is tachycardic here but afebrile.  Lab work is overall reassuring.  Pending ethanol level.  Patient is repeatedly attempting to get out of her stretcher we are concerned that she may fall, I have ordered 1 mg  of IV Ativan for patient safety which she has accepted.  Ethanol level noted to be 382 consistent with acute alcohol intoxication  We will monitor in the emergency department until stable for discharge    ____________________________________________   FINAL CLINICAL IMPRESSION(S) / ED DIAGNOSES  Final diagnoses:  Alcoholic intoxication without complication ([place])        Note:  This document was prepared using Dragon voice recognition software and may include unintentional dictation errors.   Lavonia Drafts, MD 08/14/19 [phone]

## 2019-08-13 NOTE — ED Triage Notes (Signed)
Pt arrival via [facility] from home due to altered mental status. EMS states that the patient wsa found in her back yard by her boyfriend. There was a cup next to the patient and the boyfriend thinks she may have been drinking but isn't sure. Pt was slightly combative when with EMS, pt flailing arms and legs and can't sit still on arrival. Pt muttering random phrases at this time.   Pt also has redness on stomach and right leg from heat/sun.   EMS vitals: Temp 101 ax BP 129/65 CO2 30 RR 20

## 2020-01-21 ENCOUNTER — Other Ambulatory Visit: Payer: Self-pay | Admitting: Nurse Practitioner

## 2020-01-21 DIAGNOSIS — Z1231 Encounter for screening mammogram for malignant neoplasm of breast: Secondary | ICD-10-CM

## 2020-01-25 ENCOUNTER — Other Ambulatory Visit: Payer: Self-pay

## 2020-01-25 ENCOUNTER — Encounter (INDEPENDENT_AMBULATORY_CARE_PROVIDER_SITE_OTHER): Payer: Self-pay

## 2020-01-25 ENCOUNTER — Ambulatory Visit
Admission: RE | Admit: 2020-01-25 | Discharge: 2020-01-25 | Disposition: A | Discharge status: Home / Self Care | Payer: Medicare Other | Source: Ambulatory Visit | Attending: Nurse Practitioner | Admitting: Nurse Practitioner

## 2020-01-25 DIAGNOSIS — Z1231 Encounter for screening mammogram for malignant neoplasm of breast: Secondary | ICD-10-CM | POA: Insufficient documentation

## 2020-01-26 ENCOUNTER — Other Ambulatory Visit: Payer: Self-pay | Admitting: *Deleted

## 2020-01-26 ENCOUNTER — Inpatient Hospital Stay
Admission: RE | Admit: 2020-01-26 | Discharge: 2020-01-26 | Disposition: A | Discharge status: Home / Self Care | Payer: Self-pay | Source: Ambulatory Visit | Attending: *Deleted | Admitting: *Deleted

## 2020-01-26 DIAGNOSIS — Z1231 Encounter for screening mammogram for malignant neoplasm of breast: Secondary | ICD-10-CM

## 2020-01-28 ENCOUNTER — Other Ambulatory Visit: Payer: Self-pay | Admitting: Nurse Practitioner

## 2020-12-21 ENCOUNTER — Inpatient Hospital Stay
Admission: EM | Admit: 2020-12-21 | Discharge: 2020-12-26 | DRG: 522 | Disposition: A | Discharge status: Home Health | Payer: Medicare HMO | Attending: Internal Medicine | Admitting: Internal Medicine

## 2020-12-21 ENCOUNTER — Emergency Department: Payer: Medicare HMO

## 2020-12-21 ENCOUNTER — Other Ambulatory Visit: Payer: Self-pay

## 2020-12-21 ENCOUNTER — Encounter: Payer: Self-pay | Admitting: Emergency Medicine

## 2020-12-21 DIAGNOSIS — F10220 Alcohol dependence with intoxication, uncomplicated: Secondary | ICD-10-CM | POA: Diagnosis present

## 2020-12-21 DIAGNOSIS — E871 Hypo-osmolality and hyponatremia: Secondary | ICD-10-CM | POA: Diagnosis present

## 2020-12-21 DIAGNOSIS — Z833 Family history of diabetes mellitus: Secondary | ICD-10-CM

## 2020-12-21 DIAGNOSIS — Z419 Encounter for procedure for purposes other than remedying health state, unspecified: Secondary | ICD-10-CM

## 2020-12-21 DIAGNOSIS — R7401 Elevation of levels of liver transaminase levels: Secondary | ICD-10-CM

## 2020-12-21 DIAGNOSIS — Z79899 Other long term (current) drug therapy: Secondary | ICD-10-CM | POA: Diagnosis not present

## 2020-12-21 DIAGNOSIS — K7010 Alcoholic hepatitis without ascites: Secondary | ICD-10-CM | POA: Diagnosis present

## 2020-12-21 DIAGNOSIS — F32A Depression, unspecified: Secondary | ICD-10-CM | POA: Diagnosis present

## 2020-12-21 DIAGNOSIS — K5900 Constipation, unspecified: Secondary | ICD-10-CM | POA: Diagnosis present

## 2020-12-21 DIAGNOSIS — F419 Anxiety disorder, unspecified: Secondary | ICD-10-CM | POA: Diagnosis present

## 2020-12-21 DIAGNOSIS — F10121 Alcohol abuse with intoxication delirium: Secondary | ICD-10-CM | POA: Diagnosis present

## 2020-12-21 DIAGNOSIS — F1410 Cocaine abuse, uncomplicated: Secondary | ICD-10-CM | POA: Diagnosis present

## 2020-12-21 DIAGNOSIS — F10929 Alcohol use, unspecified with intoxication, unspecified: Secondary | ICD-10-CM | POA: Diagnosis not present

## 2020-12-21 DIAGNOSIS — K219 Gastro-esophageal reflux disease without esophagitis: Secondary | ICD-10-CM | POA: Diagnosis present

## 2020-12-21 DIAGNOSIS — F10920 Alcohol use, unspecified with intoxication, uncomplicated: Secondary | ICD-10-CM | POA: Diagnosis not present

## 2020-12-21 DIAGNOSIS — J449 Chronic obstructive pulmonary disease, unspecified: Secondary | ICD-10-CM | POA: Diagnosis present

## 2020-12-21 DIAGNOSIS — E785 Hyperlipidemia, unspecified: Secondary | ICD-10-CM | POA: Diagnosis present

## 2020-12-21 DIAGNOSIS — Z7951 Long term (current) use of inhaled steroids: Secondary | ICD-10-CM

## 2020-12-21 DIAGNOSIS — Z803 Family history of malignant neoplasm of breast: Secondary | ICD-10-CM | POA: Diagnosis not present

## 2020-12-21 DIAGNOSIS — S72092A Other fracture of head and neck of left femur, initial encounter for closed fracture: Secondary | ICD-10-CM | POA: Diagnosis present

## 2020-12-21 DIAGNOSIS — J4489 Other specified chronic obstructive pulmonary disease: Secondary | ICD-10-CM | POA: Diagnosis not present

## 2020-12-21 DIAGNOSIS — E876 Hypokalemia: Secondary | ICD-10-CM | POA: Diagnosis present

## 2020-12-21 DIAGNOSIS — Y908 Blood alcohol level of 240 mg/100 ml or more: Secondary | ICD-10-CM | POA: Diagnosis present

## 2020-12-21 DIAGNOSIS — G8918 Other acute postprocedural pain: Secondary | ICD-10-CM

## 2020-12-21 DIAGNOSIS — W19XXXA Unspecified fall, initial encounter: Secondary | ICD-10-CM

## 2020-12-21 DIAGNOSIS — Y92008 Other place in unspecified non-institutional (private) residence as the place of occurrence of the external cause: Secondary | ICD-10-CM | POA: Diagnosis not present

## 2020-12-21 DIAGNOSIS — Z20822 Contact with and (suspected) exposure to covid-19: Secondary | ICD-10-CM | POA: Diagnosis present

## 2020-12-21 DIAGNOSIS — Z8541 Personal history of malignant neoplasm of cervix uteri: Secondary | ICD-10-CM | POA: Diagnosis not present

## 2020-12-21 DIAGNOSIS — Z87891 Personal history of nicotine dependence: Secondary | ICD-10-CM

## 2020-12-21 DIAGNOSIS — S72002A Fracture of unspecified part of neck of left femur, initial encounter for closed fracture: Secondary | ICD-10-CM

## 2020-12-21 DIAGNOSIS — Z8249 Family history of ischemic heart disease and other diseases of the circulatory system: Secondary | ICD-10-CM | POA: Diagnosis not present

## 2020-12-21 DIAGNOSIS — I10 Essential (primary) hypertension: Secondary | ICD-10-CM | POA: Diagnosis present

## 2020-12-21 HISTORY — DX: Essential (primary) hypertension: I10

## 2020-12-21 HISTORY — DX: Chronic obstructive pulmonary disease, unspecified: J44.9

## 2020-12-21 LAB — COMPREHENSIVE METABOLIC PANEL
ALT: 220 U/L — ABNORMAL HIGH (ref 0–44)
AST: 378 U/L — ABNORMAL HIGH (ref 15–41)
Albumin: 4.3 g/dL (ref 3.5–5.0)
Alkaline Phosphatase: 71 U/L (ref 38–126)
Anion gap: 13 (ref 5–15)
BUN: 15 mg/dL (ref 6–20)
CO2: 27 mmol/L (ref 22–32)
Calcium: 9.9 mg/dL (ref 8.9–10.3)
Chloride: 99 mmol/L (ref 98–111)
Creatinine, Ser: 0.95 mg/dL (ref 0.44–1.00)
GFR, Estimated: >60 mL/min (ref >60)
Glucose, Bld: 122 mg/dL — ABNORMAL HIGH (ref 70–99)
Potassium: 3.3 mmol/L — ABNORMAL LOW (ref 3.5–5.1)
Sodium: 139 mmol/L (ref 135–145)
Total Bilirubin: 1.3 mg/dL — ABNORMAL HIGH (ref 0.3–1.2)
Total Protein: 7.8 g/dL (ref 6.5–8.1)

## 2020-12-21 LAB — CBC WITH DIFFERENTIAL/PLATELET
Abs Immature Granulocytes: 0.06 10*3/uL (ref 0.00–0.07)
Basophils Absolute: 0.1 10*3/uL (ref 0.0–0.1)
Basophils Relative: 1 %
Eosinophils Absolute: 0.3 10*3/uL (ref 0.0–0.5)
Eosinophils Relative: 4 %
HCT: 43.1 % (ref 36.0–46.0)
Hemoglobin: 15.7 g/dL — ABNORMAL HIGH (ref 12.0–15.0)
Immature Granulocytes: 1 %
Lymphocytes Relative: 31 %
Lymphs Abs: 2.5 10*3/uL (ref 0.7–4.0)
MCH: 35 pg — ABNORMAL HIGH (ref 26.0–34.0)
MCHC: 36.4 g/dL — ABNORMAL HIGH (ref 30.0–36.0)
MCV: 96 fL (ref 80.0–100.0)
Monocytes Absolute: 0.8 10*3/uL (ref 0.1–1.0)
Monocytes Relative: 9 %
Neutro Abs: 4.5 10*3/uL (ref 1.7–7.7)
Neutrophils Relative %: 54 %
Platelets: 196 10*3/uL (ref 150–400)
RBC: 4.49 MIL/uL (ref 3.87–5.11)
RDW: 11.9 % (ref 11.5–15.5)
Smear Review: NORMAL
WBC: 8.3 10*3/uL (ref 4.0–10.5)
nRBC: 0 % (ref 0.0–0.2)

## 2020-12-21 LAB — ETHANOL: Alcohol, Ethyl (B): 278 mg/dL — ABNORMAL HIGH (ref <10)

## 2020-12-21 LAB — MAGNESIUM: Magnesium: 1.1 mg/dL — ABNORMAL LOW (ref 1.7–2.4)

## 2020-12-21 LAB — PROTIME-INR
INR: 0.9 (ref 0.8–1.2)
Prothrombin Time: 12.4 seconds (ref 11.4–15.2)

## 2020-12-21 MED ORDER — ALBUTEROL SULFATE (2.5 MG/3ML) 0.083% IN NEBU: 2.5000 mg | INHALATION_SOLUTION | RESPIRATORY_TRACT | Status: DC | PRN

## 2020-12-21 MED ORDER — MOMETASONE FURO-FORMOTEROL FUM 200-5 MCG/ACT IN AERO
2.0000 | INHALATION_SPRAY | Freq: Two times a day (BID) | RESPIRATORY_TRACT | Status: DC
Start: 2020-12-22 — End: 2020-12-26
  Administered 2020-12-22 – 2020-12-25 (×7): 2 via RESPIRATORY_TRACT
  Filled 2020-12-21: qty 8.8

## 2020-12-21 MED ORDER — LACTATED RINGERS IV BOLUS
1000.0000 mL | Freq: Once | INTRAVENOUS | Status: AC
  Administered 2020-12-21: 1000 mL via INTRAVENOUS

## 2020-12-21 MED ORDER — FENTANYL CITRATE PF 50 MCG/ML IJ SOSY
50.0000 ug | PREFILLED_SYRINGE | Freq: Once | INTRAMUSCULAR | Status: AC
  Administered 2020-12-21: 50 ug via INTRAVENOUS
  Filled 2020-12-21: qty 1

## 2020-12-21 MED ORDER — OXYCODONE HCL 5 MG PO TABS
5.0000 mg | ORAL_TABLET | ORAL | Status: AC
  Administered 2020-12-21: 5 mg via ORAL
  Filled 2020-12-21: qty 1

## 2020-12-21 MED ORDER — POTASSIUM CHLORIDE CRYS ER 20 MEQ PO TBCR
40.0000 meq | EXTENDED_RELEASE_TABLET | Freq: Once | ORAL | Status: AC
  Administered 2020-12-21: 40 meq via ORAL
  Filled 2020-12-21: qty 2

## 2020-12-21 MED ORDER — POTASSIUM CHLORIDE IN NACL 40-0.9 MEQ/L-% IV SOLN
INTRAVENOUS | Status: AC
  Administered 2020-12-22: 125 mL/h via INTRAVENOUS
  Filled 2020-12-21 (×2): qty 1000

## 2020-12-21 MED ORDER — BISACODYL 5 MG PO TBEC: 5.0000 mg | DELAYED_RELEASE_TABLET | Freq: Every day | ORAL | Status: DC | PRN

## 2020-12-21 MED ORDER — OXYCODONE HCL 5 MG PO TABS
5.0000 mg | ORAL_TABLET | ORAL | Status: DC | PRN
  Administered 2020-12-22 – 2020-12-23 (×3): 10 mg via ORAL
  Filled 2020-12-21 (×3): qty 2

## 2020-12-21 MED ORDER — LISINOPRIL 20 MG PO TABS
40.0000 mg | ORAL_TABLET | Freq: Every day | ORAL | Status: DC
  Administered 2020-12-22 – 2020-12-26 (×4): 40 mg via ORAL
  Filled 2020-12-21 (×4): qty 2

## 2020-12-21 MED ORDER — HYDROMORPHONE HCL 1 MG/ML IJ SOLN
1.0000 mg | INTRAMUSCULAR | Status: DC | PRN
  Administered 2020-12-22 – 2020-12-23 (×9): 1 mg via INTRAVENOUS
  Filled 2020-12-21 (×9): qty 1

## 2020-12-21 MED ORDER — MAGNESIUM SULFATE 4 GM/100ML IV SOLN
4.0000 g | Freq: Once | INTRAVENOUS | Status: AC
  Administered 2020-12-21: 4 g via INTRAVENOUS
  Filled 2020-12-21: qty 100

## 2020-12-21 MED ORDER — SENNOSIDES-DOCUSATE SODIUM 8.6-50 MG PO TABS: 1.0000 | ORAL_TABLET | Freq: Every evening | ORAL | Status: DC | PRN

## 2020-12-21 MED ORDER — ROSUVASTATIN CALCIUM 10 MG PO TABS
40.0000 mg | ORAL_TABLET | Freq: Every day | ORAL | Status: DC
  Administered 2020-12-22 – 2020-12-23 (×2): 40 mg via ORAL
  Filled 2020-12-21: qty 4
  Filled 2020-12-21: qty 2
  Filled 2020-12-21: qty 4
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
## 2020-12-21 NOTE — Discharge Instructions (Addendum)
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
## 2020-12-22 ENCOUNTER — Inpatient Hospital Stay: Payer: Medicare HMO

## 2020-12-22 ENCOUNTER — Inpatient Hospital Stay: Payer: Medicare HMO | Admitting: Certified Registered"

## 2020-12-22 ENCOUNTER — Encounter: Payer: Self-pay | Admitting: Internal Medicine

## 2020-12-22 ENCOUNTER — Other Ambulatory Visit: Payer: Self-pay

## 2020-12-22 ENCOUNTER — Encounter
Admission: EM | Disposition: A | Discharge status: Home Health | Payer: Self-pay | Source: Home / Self Care | Attending: Internal Medicine

## 2020-12-22 DIAGNOSIS — F1410 Cocaine abuse, uncomplicated: Secondary | ICD-10-CM

## 2020-12-22 DIAGNOSIS — J449 Chronic obstructive pulmonary disease, unspecified: Secondary | ICD-10-CM

## 2020-12-22 DIAGNOSIS — F10121 Alcohol abuse with intoxication delirium: Secondary | ICD-10-CM | POA: Diagnosis present

## 2020-12-22 DIAGNOSIS — S72002A Fracture of unspecified part of neck of left femur, initial encounter for closed fracture: Secondary | ICD-10-CM | POA: Diagnosis not present

## 2020-12-22 HISTORY — PX: TOTAL HIP ARTHROPLASTY: SHX124

## 2020-12-22 LAB — URINE DRUG SCREEN, QUALITATIVE (ARMC ONLY)
Amphetamines, Ur Screen: NOT DETECTED
Barbiturates, Ur Screen: NOT DETECTED
Benzodiazepine, Ur Scrn: NOT DETECTED
Cannabinoid 50 Ng, Ur ~~LOC~~: NOT DETECTED
Cocaine Metabolite,Ur ~~LOC~~: POSITIVE — AB
MDMA (Ecstasy)Ur Screen: NOT DETECTED
Methadone Scn, Ur: NOT DETECTED
Opiate, Ur Screen: NOT DETECTED
Phencyclidine (PCP) Ur S: NOT DETECTED
Tricyclic, Ur Screen: NOT DETECTED

## 2020-12-22 LAB — VITAMIN D 25 HYDROXY (VIT D DEFICIENCY, FRACTURES): Vit D, 25-Hydroxy: 25.74 ng/mL — ABNORMAL LOW (ref 30–100)

## 2020-12-22 LAB — ABO/RH: ABO/RH(D): O POS

## 2020-12-22 LAB — COMPREHENSIVE METABOLIC PANEL
ALT: 185 U/L — ABNORMAL HIGH (ref 0–44)
AST: 231 U/L — ABNORMAL HIGH (ref 15–41)
Albumin: 4 g/dL (ref 3.5–5.0)
Alkaline Phosphatase: 69 U/L (ref 38–126)
Anion gap: 12 (ref 5–15)
BUN: 9 mg/dL (ref 6–20)
CO2: 25 mmol/L (ref 22–32)
Calcium: 9.2 mg/dL (ref 8.9–10.3)
Chloride: 99 mmol/L (ref 98–111)
Creatinine, Ser: 0.54 mg/dL (ref 0.44–1.00)
GFR, Estimated: >60 mL/min (ref >60)
Glucose, Bld: 104 mg/dL — ABNORMAL HIGH (ref 70–99)
Potassium: 3.8 mmol/L (ref 3.5–5.1)
Sodium: 136 mmol/L (ref 135–145)
Total Bilirubin: 1.7 mg/dL — ABNORMAL HIGH (ref 0.3–1.2)
Total Protein: 7.5 g/dL (ref 6.5–8.1)

## 2020-12-22 LAB — RESP PANEL BY RT-PCR (FLU A&B, COVID) ARPGX2
Influenza A by PCR: NEGATIVE
Influenza B by PCR: NEGATIVE
SARS Coronavirus 2 by RT PCR: NEGATIVE

## 2020-12-22 LAB — CBC
HCT: 44.6 % (ref 36.0–46.0)
Hemoglobin: 15.8 g/dL — ABNORMAL HIGH (ref 12.0–15.0)
MCH: 34.7 pg — ABNORMAL HIGH (ref 26.0–34.0)
MCHC: 35.4 g/dL (ref 30.0–36.0)
MCV: 98 fL (ref 80.0–100.0)
Platelets: 184 10*3/uL (ref 150–400)
RBC: 4.55 MIL/uL (ref 3.87–5.11)
RDW: 11.9 % (ref 11.5–15.5)
WBC: 10.5 10*3/uL (ref 4.0–10.5)
nRBC: 0 % (ref 0.0–0.2)

## 2020-12-22 LAB — TYPE AND SCREEN
ABO/RH(D): O POS
Antibody Screen: NEGATIVE

## 2020-12-22 LAB — HIV ANTIBODY (ROUTINE TESTING W REFLEX): HIV Screen 4th Generation wRfx: NONREACTIVE

## 2020-12-22 LAB — MAGNESIUM: Magnesium: 1.2 mg/dL — ABNORMAL LOW (ref 1.7–2.4)

## 2020-12-22 SURGERY — ARTHROPLASTY, HIP, TOTAL, ANTERIOR APPROACH
Anesthesia: Spinal | Site: Hip | Laterality: Left

## 2020-12-22 MED ORDER — MIDAZOLAM HCL 2 MG/2ML IJ SOLN
INTRAMUSCULAR | Status: AC
  Filled 2020-12-22: qty 2

## 2020-12-22 MED ORDER — LACTATED RINGERS IV SOLN: INTRAVENOUS | Status: DC

## 2020-12-22 MED ORDER — CEFAZOLIN (ANCEF) 1 G IV SOLR
2.0000 g | INTRAVENOUS | Status: AC
  Administered 2020-12-22: 2 g
  Filled 2020-12-22: qty 2

## 2020-12-22 MED ORDER — VASOPRESSIN 20 UNIT/ML IV SOLN
INTRAVENOUS | Status: DC | PRN
  Administered 2020-12-22 (×3): 1 [IU] via INTRAVENOUS

## 2020-12-22 MED ORDER — ONDANSETRON HCL 4 MG/2ML IJ SOLN
INTRAMUSCULAR | Status: AC
  Filled 2020-12-22: qty 2

## 2020-12-22 MED ORDER — MENTHOL 3 MG MT LOZG
1.0000 | LOZENGE | OROMUCOSAL | Status: DC | PRN
  Filled 2020-12-22: qty 9

## 2020-12-22 MED ORDER — METOCLOPRAMIDE HCL 10 MG PO TABS: 5.0000 mg | ORAL_TABLET | Freq: Three times a day (TID) | ORAL | Status: DC | PRN

## 2020-12-22 MED ORDER — SODIUM CHLORIDE 0.9 % IV SOLN: INTRAVENOUS | Status: DC

## 2020-12-22 MED ORDER — VITAMIN B-12 1000 MCG PO TABS: 1000.0000 ug | ORAL_TABLET | Freq: Every day | ORAL | Status: DC

## 2020-12-22 MED ORDER — PHENOL 1.4 % MT LIQD
1.0000 | OROMUCOSAL | Status: DC | PRN
  Filled 2020-12-22: qty 177

## 2020-12-22 MED ORDER — DEXAMETHASONE SODIUM PHOSPHATE 10 MG/ML IJ SOLN
INTRAMUSCULAR | Status: AC
  Filled 2020-12-22: qty 1

## 2020-12-22 MED ORDER — BUPIVACAINE LIPOSOME 1.3 % IJ SUSP
INTRAMUSCULAR | Status: AC
  Filled 2020-12-22: qty 20

## 2020-12-22 MED ORDER — KETAMINE HCL 50 MG/5ML IJ SOSY
PREFILLED_SYRINGE | INTRAMUSCULAR | Status: AC
  Filled 2020-12-22: qty 5

## 2020-12-22 MED ORDER — FLUTICASONE FUROATE-VILANTEROL 200-25 MCG/INH IN AEPB: 1.0000 | INHALATION_SPRAY | Freq: Every day | RESPIRATORY_TRACT | Status: DC

## 2020-12-22 MED ORDER — ALUM & MAG HYDROXIDE-SIMETH 200-200-20 MG/5ML PO SUSP: 30.0000 mL | ORAL | Status: DC | PRN

## 2020-12-22 MED ORDER — MAGNESIUM HYDROXIDE 400 MG/5ML PO SUSP
30.0000 mL | Freq: Every day | ORAL | Status: DC
  Administered 2020-12-22 – 2020-12-26 (×3): 30 mL via ORAL
  Filled 2020-12-22 (×3): qty 30

## 2020-12-22 MED ORDER — BUPIVACAINE-EPINEPHRINE (PF) 0.25% -1:200000 IJ SOLN
INTRAMUSCULAR | Status: AC
  Filled 2020-12-22: qty 30

## 2020-12-22 MED ORDER — CHLORHEXIDINE GLUCONATE CLOTH 2 % EX PADS
6.0000 | MEDICATED_PAD | Freq: Every day | CUTANEOUS | Status: DC
  Administered 2020-12-22 – 2020-12-23 (×2): 6 via TOPICAL

## 2020-12-22 MED ORDER — PROPOFOL 500 MG/50ML IV EMUL
INTRAVENOUS | Status: DC | PRN
  Administered 2020-12-22: 50 ug/kg/min via INTRAVENOUS
  Administered 2020-12-22: 75 ug/kg/min via INTRAVENOUS

## 2020-12-22 MED ORDER — ENOXAPARIN SODIUM 40 MG/0.4ML IJ SOSY
40.0000 mg | PREFILLED_SYRINGE | INTRAMUSCULAR | Status: DC
  Administered 2020-12-23 – 2020-12-26 (×4): 40 mg via SUBCUTANEOUS
  Filled 2020-12-22 (×4): qty 0.4

## 2020-12-22 MED ORDER — CEFAZOLIN SODIUM-DEXTROSE 2-4 GM/100ML-% IV SOLN
2.0000 g | Freq: Four times a day (QID) | INTRAVENOUS | Status: AC
Start: 2020-12-22 — End: 2020-12-23
  Administered 2020-12-22 – 2020-12-23 (×2): 2 g via INTRAVENOUS
  Filled 2020-12-22 (×2): qty 100

## 2020-12-22 MED ORDER — NEOMYCIN-POLYMYXIN B GU 40-200000 IR SOLN
Status: DC | PRN
  Administered 2020-12-22: 4 mL

## 2020-12-22 MED ORDER — DIPHENHYDRAMINE HCL 12.5 MG/5ML PO ELIX
12.5000 mg | ORAL_SOLUTION | ORAL | Status: DC | PRN
  Administered 2020-12-23: 12.5 mg via ORAL
  Administered 2020-12-24 – 2020-12-26 (×2): 25 mg via ORAL
  Filled 2020-12-22: qty 10
  Filled 2020-12-22: qty 5
  Filled 2020-12-22: qty 10

## 2020-12-22 MED ORDER — 0.9 % SODIUM CHLORIDE (POUR BTL) OPTIME
TOPICAL | Status: DC | PRN
  Administered 2020-12-22: 120 mL

## 2020-12-22 MED ORDER — SERTRALINE HCL 50 MG PO TABS
100.0000 mg | ORAL_TABLET | Freq: Every day | ORAL | Status: DC
  Administered 2020-12-23 – 2020-12-26 (×4): 100 mg via ORAL
  Filled 2020-12-22 (×4): qty 2

## 2020-12-22 MED ORDER — SERTRALINE HCL 50 MG PO TABS: 100.0000 mg | ORAL_TABLET | Freq: Every day | ORAL | Status: DC

## 2020-12-22 MED ORDER — ROCURONIUM BROMIDE 10 MG/ML (PF) SYRINGE
PREFILLED_SYRINGE | INTRAVENOUS | Status: AC
  Filled 2020-12-22: qty 10

## 2020-12-22 MED ORDER — SODIUM CHLORIDE 0.9 % IV SOLN
INTRAVENOUS | Status: DC | PRN
  Administered 2020-12-22: 25 ug/min via INTRAVENOUS

## 2020-12-22 MED ORDER — BUPIVACAINE HCL (PF) 0.5 % IJ SOLN
INTRAMUSCULAR | Status: DC | PRN
  Administered 2020-12-22: 2.5 mL

## 2020-12-22 MED ORDER — SODIUM CHLORIDE FLUSH 0.9 % IV SOLN
INTRAVENOUS | Status: AC
  Filled 2020-12-22: qty 40

## 2020-12-22 MED ORDER — ONDANSETRON HCL 4 MG/2ML IJ SOLN: 4.0000 mg | Freq: Once | INTRAMUSCULAR | Status: DC | PRN

## 2020-12-22 MED ORDER — FENTANYL CITRATE (PF) 100 MCG/2ML IJ SOLN: 25.0000 ug | INTRAMUSCULAR | Status: DC | PRN

## 2020-12-22 MED ORDER — CEFAZOLIN SODIUM-DEXTROSE 2-4 GM/100ML-% IV SOLN
INTRAVENOUS | Status: AC
  Filled 2020-12-22: qty 100

## 2020-12-22 MED ORDER — SODIUM CHLORIDE 0.9 % IV SOLN
INTRAVENOUS | Status: DC | PRN
  Administered 2020-12-22: 60 mL

## 2020-12-22 MED ORDER — DOCUSATE SODIUM 100 MG PO CAPS
100.0000 mg | ORAL_CAPSULE | Freq: Two times a day (BID) | ORAL | Status: DC
  Administered 2020-12-22 – 2020-12-26 (×5): 100 mg via ORAL
  Filled 2020-12-22 (×6): qty 1

## 2020-12-22 MED ORDER — PANTOPRAZOLE SODIUM 40 MG PO TBEC
40.0000 mg | DELAYED_RELEASE_TABLET | Freq: Every day | ORAL | Status: DC
  Administered 2020-12-23 – 2020-12-26 (×4): 40 mg via ORAL
  Filled 2020-12-22 (×4): qty 1

## 2020-12-22 MED ORDER — VITAMIN B-12 1000 MCG PO TABS
1000.0000 ug | ORAL_TABLET | Freq: Every day | ORAL | Status: DC
  Administered 2020-12-23 – 2020-12-26 (×4): 1000 ug via ORAL
  Filled 2020-12-22 (×4): qty 1

## 2020-12-22 MED ORDER — CHLORHEXIDINE GLUCONATE 0.12 % MT SOLN
OROMUCOSAL | Status: AC
  Administered 2020-12-22: 15 mL via OROMUCOSAL
  Filled 2020-12-22: qty 15

## 2020-12-22 MED ORDER — ONDANSETRON HCL 4 MG PO TABS: 4.0000 mg | ORAL_TABLET | Freq: Four times a day (QID) | ORAL | Status: DC | PRN

## 2020-12-22 MED ORDER — FENTANYL CITRATE (PF) 100 MCG/2ML IJ SOLN
INTRAMUSCULAR | Status: DC | PRN
  Administered 2020-12-22: 50 ug via INTRAVENOUS

## 2020-12-22 MED ORDER — KETAMINE HCL 10 MG/ML IJ SOLN
INTRAMUSCULAR | Status: DC | PRN
  Administered 2020-12-22: 20 mg via INTRAVENOUS
  Administered 2020-12-22: 10 mg via INTRAVENOUS

## 2020-12-22 MED ORDER — BUPIVACAINE-EPINEPHRINE 0.25% -1:200000 IJ SOLN
INTRAMUSCULAR | Status: DC | PRN
  Administered 2020-12-22: 30 mL

## 2020-12-22 MED ORDER — LISINOPRIL-HYDROCHLOROTHIAZIDE 20-12.5 MG PO TABS: 1.0000 | ORAL_TABLET | Freq: Every day | ORAL | Status: DC

## 2020-12-22 MED ORDER — ONDANSETRON HCL 4 MG/2ML IJ SOLN: 4.0000 mg | Freq: Four times a day (QID) | INTRAMUSCULAR | Status: DC | PRN

## 2020-12-22 MED ORDER — VASOPRESSIN 20 UNIT/ML IV SOLN
INTRAVENOUS | Status: AC
  Filled 2020-12-22: qty 1

## 2020-12-22 MED ORDER — NEOMYCIN-POLYMYXIN B GU 40-200000 IR SOLN
Status: AC
  Filled 2020-12-22: qty 4

## 2020-12-22 MED ORDER — PANTOPRAZOLE SODIUM 40 MG PO TBEC: 40.0000 mg | DELAYED_RELEASE_TABLET | Freq: Every day | ORAL | Status: DC

## 2020-12-22 MED ORDER — PHENYLEPHRINE HCL (PRESSORS) 10 MG/ML IV SOLN
INTRAVENOUS | Status: AC
  Filled 2020-12-22: qty 1

## 2020-12-22 MED ORDER — MIDAZOLAM HCL 5 MG/5ML IJ SOLN
INTRAMUSCULAR | Status: DC | PRN
  Administered 2020-12-22: 2 mg via INTRAVENOUS

## 2020-12-22 MED ORDER — BISACODYL 10 MG RE SUPP: 10.0000 mg | Freq: Every day | RECTAL | Status: DC | PRN

## 2020-12-22 MED ORDER — FENTANYL CITRATE (PF) 100 MCG/2ML IJ SOLN
INTRAMUSCULAR | Status: AC
  Filled 2020-12-22: qty 2

## 2020-12-22 MED ORDER — CHLORHEXIDINE GLUCONATE 0.12 % MT SOLN: 15.0000 mL | Freq: Once | OROMUCOSAL | Status: AC

## 2020-12-22 MED ORDER — METOCLOPRAMIDE HCL 5 MG/ML IJ SOLN: 5.0000 mg | Freq: Three times a day (TID) | INTRAMUSCULAR | Status: DC | PRN

## 2020-12-22 MED ORDER — PROPOFOL 1000 MG/100ML IV EMUL
INTRAVENOUS | Status: AC
  Filled 2020-12-22: qty 100

## 2020-12-22 MED ORDER — DEXMEDETOMIDINE HCL IN NACL 200 MCG/50ML IV SOLN: 0.2000 ug/kg/h | INTRAVENOUS | Status: DC

## 2020-12-22 MED ORDER — PROPOFOL 10 MG/ML IV BOLUS
INTRAVENOUS | Status: DC | PRN
  Administered 2020-12-22: 20 mg via INTRAVENOUS

## 2020-12-22 MED ORDER — PHENYLEPHRINE HCL (PRESSORS) 10 MG/ML IV SOLN
INTRAVENOUS | Status: DC | PRN
  Administered 2020-12-22: 200 ug via INTRAVENOUS
  Administered 2020-12-22: 100 ug via INTRAVENOUS
  Administered 2020-12-22: 150 ug via INTRAVENOUS
  Administered 2020-12-22: 100 ug via INTRAVENOUS

## 2020-12-22 MED ORDER — MAGNESIUM HYDROXIDE 400 MG/5ML PO SUSP: 30.0000 mL | Freq: Every day | ORAL | Status: DC | PRN

## 2020-12-22 SURGICAL SUPPLY — 61 items
BLADE SAGITTAL AGGR TOOTH XLG (BLADE) ×2 IMPLANT
BNDG COHESIVE 6X5 TAN ST LF (GAUZE/BANDAGES/DRESSINGS) ×6 IMPLANT
CANISTER WOUND CARE 500ML ATS (WOUND CARE) ×2 IMPLANT
CHLORAPREP W/TINT 26 (MISCELLANEOUS) ×2 IMPLANT
COVER BACK TABLE REUSABLE LG (DRAPES) ×2 IMPLANT
DRAPE 3/4 80X56 (DRAPES) ×6 IMPLANT
DRAPE C-ARM XRAY 36X54 (DRAPES) ×2 IMPLANT
DRAPE INCISE IOBAN 66X60 STRL (DRAPES) IMPLANT
DRAPE POUCH INSTRU U-SHP 10X18 (DRAPES) ×2 IMPLANT
DRESSING SURGICEL FIBRLLR 1X2 (HEMOSTASIS) ×2 IMPLANT
DRSG MEPILEX SACRM 8.7X9.8 (GAUZE/BANDAGES/DRESSINGS) ×2 IMPLANT
DRSG OPSITE POSTOP 4X8 (GAUZE/BANDAGES/DRESSINGS) IMPLANT
DRSG SURGICEL FIBRILLAR 1X2 (HEMOSTASIS) ×4
ELECT BLADE 6.5 EXT (BLADE) ×2 IMPLANT
ELECT REM PT RETURN 9FT ADLT (ELECTROSURGICAL) ×2
ELECTRODE REM PT RTRN 9FT ADLT (ELECTROSURGICAL) ×1 IMPLANT
GAUZE 4X4 16PLY ~~LOC~~+RFID DBL (SPONGE) ×2 IMPLANT
GLOVE SURG SYN 9.0  PF PI (GLOVE) ×2
GLOVE SURG SYN 9.0 PF PI (GLOVE) ×2 IMPLANT
GLOVE SURG UNDER POLY LF SZ9 (GLOVE) ×2 IMPLANT
GOWN SRG 2XL LVL 4 RGLN SLV (GOWNS) ×1 IMPLANT
GOWN STRL NON-REIN 2XL LVL4 (GOWNS) ×1
GOWN STRL REUS W/ TWL LRG LVL3 (GOWN DISPOSABLE) ×1 IMPLANT
GOWN STRL REUS W/TWL LRG LVL3 (GOWN DISPOSABLE) ×1
HEMOVAC 400CC 10FR (MISCELLANEOUS) IMPLANT
HIP FEM HD S 28 (Head) ×2 IMPLANT
HOLDER FOLEY CATH W/STRAP (MISCELLANEOUS) ×2 IMPLANT
IRRIGATION SURGIPHOR STRL (IV SOLUTION) IMPLANT
KIT PREVENA INCISION MGT 13 (CANNISTER) ×2 IMPLANT
LINER DUAL MOB 50MM (Liner) ×2 IMPLANT
MANIFOLD NEPTUNE II (INSTRUMENTS) ×2 IMPLANT
MAT ABSORB  FLUID 56X50 GRAY (MISCELLANEOUS) ×1
MAT ABSORB FLUID 56X50 GRAY (MISCELLANEOUS) ×1 IMPLANT
NDL SAFETY ECLIPSE 18X1.5 (NEEDLE) ×1 IMPLANT
NEEDLE HYPO 18GX1.5 SHARP (NEEDLE) ×1
NEEDLE SPNL 20GX3.5 QUINCKE YW (NEEDLE) ×4 IMPLANT
NS IRRIG 1000ML POUR BTL (IV SOLUTION) ×2 IMPLANT
PACK HIP COMPR (MISCELLANEOUS) ×2 IMPLANT
SCALPEL PROTECTED #10 DISP (BLADE) ×4 IMPLANT
SHELL ACETABULAR SZ0 50 DME (Shell) ×2 IMPLANT
SOL PREP PVP 2OZ (MISCELLANEOUS) ×2
SOLUTION PREP PVP 2OZ (MISCELLANEOUS) ×1 IMPLANT
SPONGE DRAIN TRACH 4X4 STRL 2S (GAUZE/BANDAGES/DRESSINGS) ×2 IMPLANT
SPONGE T-LAP 18X18 ~~LOC~~+RFID (SPONGE) ×4 IMPLANT
STAPLER SKIN PROX 35W (STAPLE) ×2 IMPLANT
STEM FEMORAL SZ3  STD COLLARED (Stem) ×2 IMPLANT
STRAP SAFETY 5IN WIDE (MISCELLANEOUS) ×2 IMPLANT
SUT DVC 2 QUILL PDO  T11 36X36 (SUTURE) ×1
SUT DVC 2 QUILL PDO T11 36X36 (SUTURE) ×1 IMPLANT
SUT SILK 0 (SUTURE) ×1
SUT SILK 0 30XBRD TIE 6 (SUTURE) ×1 IMPLANT
SUT V-LOC 90 ABS DVC 3-0 CL (SUTURE) ×2 IMPLANT
SUT VIC AB 1 CT1 36 (SUTURE) ×2 IMPLANT
SYR 20ML LL LF (SYRINGE) ×2 IMPLANT
SYR 30ML LL (SYRINGE) ×2 IMPLANT
SYR 50ML LL SCALE MARK (SYRINGE) ×4 IMPLANT
SYR BULB IRRIG 60ML STRL (SYRINGE) ×2 IMPLANT
TAPE MICROFOAM 4IN (TAPE) ×2 IMPLANT
TOWEL OR 17X26 4PK STRL BLUE (TOWEL DISPOSABLE) ×2 IMPLANT
TRAY FOLEY MTR SLVR 16FR STAT (SET/KITS/TRAYS/PACK) ×2 IMPLANT
WATER STERILE IRR 500ML POUR (IV SOLUTION) ×2 IMPLANT

## 2020-12-22 NOTE — Assessment & Plan Note (Signed)
Continue lisinopril.  Holding HCTZ due to hypokalemia/hypomagnesemia

## 2020-12-22 NOTE — Progress Notes (Signed)
  Progress Note    Shirley Evans   NMM:[id number]  DOB: 08-Dec-1965  DOA: 12/21/2020     1 Date of Service: 12/22/2020     Subjective:  Increasing problem with pain on her left hip area.  She is agitated and grimacing  Hospital Problems * Fracture of femoral neck, left, closed ([place]) Plan for surgery later this afternoon at 4 PM by orthopedic.  At mild to moderate risk for planned surgery considering her alcoholism and drug (UDS positive for cocaine) issue. Pain management and DVT prophylaxis per Ortho  Hypertension Continue lisinopril.  Holding HCTZ due to hypokalemia/hypomagnesemia  Hypokalemia Replete and recheck  Hypomagnesemia Replete and recheck  Acute alcohol intoxication ([place]) Serum alcohol level of 278 on admission.  Monitor LFTs which were elevated on admission     Objective Vital signs were reviewed  Vitals:   12/22/20 0200 12/22/20 0317 12/22/20 0806 12/22/20 1041  BP: (!) 153/83 (!) 157/94 (!) 187/116 (!) 163/101  Pulse: 97 92 87 91  Resp: 15 20 20    Temp:  97.9 F (36.6 C) 98.3 F (36.8 C)   TempSrc:  Oral    SpO2: 94% 98% 97% 94%  Weight:      Height:       77.1 kg  Exam Physical Exam  55 year old female lying in the bed very uncomfortable with pain Eyes pupil equal round reactive to light and accommodation Neck supple no jugular venous distention Lungs clear to auscultation bilaterally no wheezing rales rhonchi or crepitation Cardiovascular S1-S2 normal, no murmur rales or gallop Abdomen soft, benign Neuro alert, nonfocal Psych patient is agitated Extremities left leg is shortened and externally rotated   Labs / Other Information My review of labs, imaging, notes and other tests shows no new significant findings.     Time spent: 25 minutes [facility] 12/22/2020, 1:00 PM

## 2020-12-22 NOTE — Transfer of Care (Signed)
Immediate Anesthesia Transfer of Care Note  Patient: Shirley Evans  Procedure(s) Performed: TOTAL HIP ARTHROPLASTY ANTERIOR APPROACH (Left: Hip)  Patient Location: PACU  Anesthesia Type:General and Spinal  Level of Consciousness: drowsy  Airway & Oxygen Therapy: Patient Spontanous Breathing and Patient connected to face mask oxygen  Post-op Assessment: Report given to RN and Post -op Vital signs reviewed and stable  Post vital signs: Reviewed and stable  Last Vitals:  Vitals Value Taken Time  BP 102/74 12/22/20 1650  Temp    Pulse 75 12/22/20 1652  Resp 11 12/22/20 1652  SpO2 100 % 12/22/20 1652  Vitals shown include unvalidated device data.  Last Pain:  Vitals:   12/22/20 1344  TempSrc:   PainSc: Asleep         Complications: No notable events documented.

## 2020-12-22 NOTE — Assessment & Plan Note (Signed)
Replete and recheck ?

## 2020-12-22 NOTE — Op Note (Signed)
12/22/2020  4:53 PM  PATIENT:  Shirley Evans  55 y.o. female  PRE-OPERATIVE DIAGNOSIS:  Left hip fracture, displaced femoral neck with mild arthritis  POST-OPERATIVE DIAGNOSIS:  Left hip fracture, seen  PROCEDURE:  Procedure(s): TOTAL HIP ARTHROPLASTY ANTERIOR APPROACH (Left)  SURGEON: Laurene Footman, MD  ASSISTANTS: None  ANESTHESIA:   spinal  EBL:  Total I/O In: 1666.1 [I.V.:1666.1] Out: 950 [Urine:800; Blood:150]  BLOOD ADMINISTERED:none  DRAINS:  Incisional wound VAC    LOCAL MEDICATIONS USED:  MARCAINE    and OTHER Exparel  SPECIMEN: Left femoral head  DISPOSITION OF SPECIMEN:  PATHOLOGY  COUNTS:  YES  TOURNIQUET:  * No tourniquets in log *  IMPLANTS: Medacta AMIS 3 standard stem with 50 mm Mpact DM cup and liner with metal S 28 mm head  DICTATION: .Dragon Dictation   The patient was brought to the operating room and after spinal anesthesia was obtained patient was placed on the operative table with the ipsilateral foot into the Medacta attachment, contralateral leg on a well-padded table. C-arm was brought in and preop template x-ray taken. After prepping and draping in usual sterile fashion appropriate patient identification and timeout procedures were completed. Anterior approach to the hip was obtained and centered over the greater trochanter and TFL muscle. The subcutaneous tissue was incised hemostasis being achieved by electrocautery. TFL fascia was incised and the muscle retracted laterally deep retractor placed. The lateral femoral circumflex vessels were identified and ligated. The anterior capsule was exposed and a capsulotomy performed. The neck was identified and a femoral neck cut carried out with a saw. The head was removed without difficulty and showed sclerotic femoral head and acetabulum. Reaming was carried out to 50 mm and a 50 mm cup trial gave appropriate tightness to the acetabular component a 50 DM cup was impacted into position. The leg was then  externally rotated and ischiofemoral and pubofemoral releases carried out. The femur was sequentially broached to a size 3, size 3 standard with S head trials were placed and the final components chosen. The 3 standard stem was inserted along with a metal S 28 mm head and 50 mm liner. The hip was reduced and was stable the wound was thoroughly irrigated with fibrillar placed along the posterior capsule and medial neck. The deep fascia ws closed using a heavy Quill after infiltration of 30 cc of quarter percent Sensorcaine with epinephrine diluted with Exparel throughout the case .3-0 V-loc to close the skin with skin staples.  Incisional wound VAC applied and patient was sent to recovery in stable condition.   PLAN OF CARE: Admit to inpatient

## 2020-12-22 NOTE — TOC Progression Note (Signed)
Transition of Care [facility]) - Progression Note    Patient Details  Name: Shirley Evans MRN: [id number] Date of Birth: 1965/07/16  Transition of Care [facility]) CM/SW Shaniko, RN Phone Number: 12/22/2020, 1:23 PM  Clinical Narrative:     The patient is to have Surgery later today, She will be seen by PT afterwards and they will make recommendations, TOC to monitor for needs and assist in DC planning  Expected Discharge Plan and Services                                                 Social Determinants of Health (SDOH) Interventions    Readmission Risk Interventions No flowsheet data found.

## 2020-12-22 NOTE — Assessment & Plan Note (Addendum)
Plan for surgery later this afternoon at 4 PM by orthopedic.  At mild to moderate risk for planned surgery considering her alcoholism and drug (UDS positive for cocaine) issue. Pain management and DVT prophylaxis per Ortho

## 2020-12-22 NOTE — Progress Notes (Signed)
With patient's positive cocaine test she is at high risk for anesthesia but with her subs abuse history I think there are also other potential complications of waiting including DTs.  I think it is urgent for her to have surgery to get her mobilized and prevent pulmonary complications.
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
## 2020-12-22 NOTE — Assessment & Plan Note (Addendum)
Replete and recheck ?

## 2020-12-22 NOTE — Assessment & Plan Note (Signed)
Serum alcohol level of 278 on admission.  Monitor LFTs which were elevated on admission
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
## 2020-12-23 ENCOUNTER — Encounter: Payer: Self-pay | Admitting: Orthopedic Surgery

## 2020-12-23 DIAGNOSIS — S72002A Fracture of unspecified part of neck of left femur, initial encounter for closed fracture: Secondary | ICD-10-CM | POA: Diagnosis not present

## 2020-12-23 DIAGNOSIS — F10929 Alcohol use, unspecified with intoxication, unspecified: Secondary | ICD-10-CM | POA: Diagnosis not present

## 2020-12-23 LAB — BASIC METABOLIC PANEL
Anion gap: 11 (ref 5–15)
BUN: 8 mg/dL (ref 6–20)
CO2: 27 mmol/L (ref 22–32)
Calcium: 8.5 mg/dL — ABNORMAL LOW (ref 8.9–10.3)
Chloride: 93 mmol/L — ABNORMAL LOW (ref 98–111)
Creatinine, Ser: 0.65 mg/dL (ref 0.44–1.00)
GFR, Estimated: >60 mL/min (ref >60)
Glucose, Bld: 133 mg/dL — ABNORMAL HIGH (ref 70–99)
Potassium: 3.3 mmol/L — ABNORMAL LOW (ref 3.5–5.1)
Sodium: 131 mmol/L — ABNORMAL LOW (ref 135–145)

## 2020-12-23 LAB — CBC
HCT: 39 % (ref 36.0–46.0)
Hemoglobin: 13.9 g/dL (ref 12.0–15.0)
MCH: 35.5 pg — ABNORMAL HIGH (ref 26.0–34.0)
MCHC: 35.6 g/dL (ref 30.0–36.0)
MCV: 99.7 fL (ref 80.0–100.0)
Platelets: 171 10*3/uL (ref 150–400)
RBC: 3.91 MIL/uL (ref 3.87–5.11)
RDW: 12.1 % (ref 11.5–15.5)
WBC: 11.3 10*3/uL — ABNORMAL HIGH (ref 4.0–10.5)
nRBC: 0 % (ref 0.0–0.2)

## 2020-12-23 LAB — MAGNESIUM: Magnesium: 0.8 mg/dL — CL (ref 1.7–2.4)

## 2020-12-23 MED ORDER — OXYCODONE-ACETAMINOPHEN 7.5-325 MG PO TABS
1.0000 | ORAL_TABLET | ORAL | Status: DC | PRN
  Administered 2020-12-23 – 2020-12-24 (×4): 2 via ORAL
  Administered 2020-12-24: 1 via ORAL
  Administered 2020-12-24 – 2020-12-26 (×9): 2 via ORAL
  Filled 2020-12-23 (×9): qty 2
  Filled 2020-12-23: qty 1
  Filled 2020-12-23 (×5): qty 2

## 2020-12-23 MED ORDER — POTASSIUM CHLORIDE CRYS ER 20 MEQ PO TBCR
40.0000 meq | EXTENDED_RELEASE_TABLET | Freq: Once | ORAL | Status: AC
  Administered 2020-12-23: 40 meq via ORAL
  Filled 2020-12-23: qty 2

## 2020-12-23 MED ORDER — FOLIC ACID 1 MG PO TABS
1.0000 mg | ORAL_TABLET | Freq: Every day | ORAL | Status: DC
  Administered 2020-12-23 – 2020-12-26 (×4): 1 mg via ORAL
  Filled 2020-12-23 (×4): qty 1

## 2020-12-23 MED ORDER — HYDROMORPHONE HCL 1 MG/ML IJ SOLN
1.0000 mg | INTRAMUSCULAR | Status: DC | PRN
  Administered 2020-12-23 – 2020-12-24 (×3): 1 mg via INTRAVENOUS
  Filled 2020-12-23 (×3): qty 1

## 2020-12-23 MED ORDER — MAGNESIUM SULFATE 4 GM/100ML IV SOLN
4.0000 g | INTRAVENOUS | Status: AC
  Administered 2020-12-23 (×2): 4 g via INTRAVENOUS
  Filled 2020-12-23 (×2): qty 100

## 2020-12-23 MED ORDER — THIAMINE HCL 100 MG PO TABS
100.0000 mg | ORAL_TABLET | Freq: Every day | ORAL | Status: DC
  Administered 2020-12-23 – 2020-12-26 (×4): 100 mg via ORAL
  Filled 2020-12-23 (×4): qty 1

## 2020-12-23 NOTE — Evaluation (Signed)
Physical Therapy Evaluation Patient Details Name: Shirley Evans MRN: [id number] DOB: Nov 06, 1965 Today's Date: 12/23/2020  History of Present Illness  Pt is 54 y/o s/p L total hip with anterior approach after mechanical fall. Pt positive for ETOH and cocaine on admission.  Clinical Impression  Pt received supine in bed, agreeable to PT evaluation. Pt asking for more pain meds; PT checked with RN who states pt is up-to-date with all pain meds. Pt lives with boyfriend in single story home; she states boyfriend is out of town until at least 10/8. She has 3 STE without a railing or 6 STE with L-sided railing. She states baseline wheezing/SOB with activity; she does use an inhaler. Pt has some conflicting history when PT compared to OT info obtained. Pt was fully oriented but she did have slurred speech and was difficult to understand.    Pt performed bed mobility with MIN A for managing LLE. CGA for STS and ambulation. She does report significant pain while at rest and with activity. Wheezing was noted to sit up to EOB and again with standing mobility. PT guided pt through pursed lip breathing. Session was limited by pain. PT will educate on handout of exercises at follow up session today. Would benefit from skilled PT to address above deficits and promote optimal return to PLOF.    Recommendations for follow up therapy are one component of a multi-disciplinary discharge planning process, led by the attending physician.  Recommendations may be updated based on patient status, additional functional criteria and insurance authorization.  Follow Up Recommendations SNF;Supervision for mobility/OOB    Equipment Recommendations  Other (comment) (TBD at next venue of care)    Recommendations for Other Services       Precautions / Restrictions Precautions Precautions: Fall;Anterior Hip Precaution Comments: no hip precautions Restrictions Weight Bearing Restrictions: Yes LLE Weight Bearing: Weight  bearing as tolerated      Mobility  Bed Mobility Overal bed mobility: Needs Assistance Bed Mobility: Supine to Sit;Sit to Supine Rolling: Mod assist   Supine to sit: Min assist Sit to supine: Min guard   General bed mobility comments: MIN A to manage LLE while sitting up to EOB. Pt lifted her own LLE back into bed when returning to supine.    Transfers Overall transfer level: Needs assistance Equipment used: Rolling walker (2 wheeled) Transfers: Sit to/from Stand Sit to Stand: Min guard;From elevated surface         General transfer comment: CGA to steady, VC for sequencing and decreased WB through LLE for comfort, elevated EOB.  Ambulation/Gait Ambulation/Gait assistance: Min guard Gait Distance (Feet): 6 Feet Assistive device: Rolling walker (2 wheeled) Gait Pattern/deviations: Step-to pattern;Decreased stance time - left;Decreased weight shift to right Gait velocity: significantly decreased   General Gait Details: 63ft forward ambulation, turn 90*, 26ft side steps to the right. CGA to steady. Minimal WB through LLE with UE support of RW. Fatigue and pain limiting distance and gait velocity.  Stairs            Wheelchair Mobility    Modified Rankin (Stroke Patients Only)       Balance Overall balance assessment: Needs assistance Sitting-balance support: Single extremity supported;Feet supported Sitting balance-Leahy Scale: Fair Sitting balance - Comments: leaning to the right on bed rail for comfort, is able to sit up without support with increased hip pain   Standing balance support: Bilateral upper extremity supported;During functional activity Standing balance-Leahy Scale: Poor Standing balance comment: Required CGA and RW during  standing and ambualation - significant UE support with BUE shaking due to UE fatigue                             Pertinent Vitals/Pain Pain Assessment: 0-10 Pain Score: 10-Worst pain ever Pain Location: 100/10  reported in L hip Pain Descriptors / Indicators: Aching;Constant;Sharp Pain Intervention(s): Limited activity within patient's tolerance;Monitored during session;Premedicated before session;Repositioned    Home Living Family/patient expects to be discharged to:: Private residence Living Arrangements: Spouse/significant other Available Help at Discharge: Family;Available PRN/intermittently Type of Home: House Home Access: Stairs to enter Entrance Stairs-Rails: Left Entrance Stairs-Number of Steps: 6 Home Layout: One level Home Equipment: None Additional Comments: Does not use AD at baseline. She states she only leaves the house for doctors appointments. Reports 3 falls in the past 6 months - 2 falls day of admission leading to hip fx.    Prior Function Level of Independence: Independent         Comments: Pt reports living with boyfriend but that he "left for [place]" and reports her mother can assist her as needed. Pt reports ind prior to admission in all aspects of self care. Her boyfriend performs cooking/cleaning/grocery shopping.     Hand Dominance   Dominant Hand: Right    Extremity/Trunk Assessment   Upper Extremity Assessment Upper Extremity Assessment: Overall WFL for tasks assessed    Lower Extremity Assessment Lower Extremity Assessment: LLE deficits/detail LLE Deficits / Details: RLE WFL; LLE s/p THA - not tested LLE: Unable to fully assess due to pain       Communication   Communication: Other (comment) (slurred speech)  Cognition Arousal/Alertness: Awake/alert Behavior During Therapy: Restless;Anxious Overall Cognitive Status: Within Functional Limits for tasks assessed                                 General Comments: Pt A & O x 4 but with slurred speech      General Comments General comments (skin integrity, edema, etc.): Pt on 2L O2 via [place]. During mobility, pt removed O2 and refused to put it back on. SpO2 decreased to 89%. Once back  in bed and O2 back on, SpO2 returned to 95%.    Exercises     Assessment/Plan    PT Assessment Patient needs continued PT services  PT Problem List Decreased strength;Decreased range of motion;Decreased activity tolerance;Decreased safety awareness;Decreased balance;Pain;Decreased mobility;Cardiopulmonary status limiting activity       PT Treatment Interventions DME instruction;Gait training;Balance training;Neuromuscular re-education;Stair training;Functional mobility training;Patient/family education;Therapeutic activities;Therapeutic exercise    PT Goals (Current goals can be found in the Care Plan section)  Acute Rehab PT Goals Patient Stated Goal: "leg to stop hurting"    Frequency BID   Barriers to discharge Inaccessible home environment;Decreased caregiver support boyfriend out of town; 3 STE w/o railing or 6 STE with single railing    Co-evaluation               AM-PAC PT "6 Clicks" Mobility  Outcome Measure Help needed turning from your back to your side while in a flat bed without using bedrails?: A Little Help needed moving from lying on your back to sitting on the side of a flat bed without using bedrails?: A Little Help needed moving to and from a bed to a chair (including a wheelchair)?: A Little Help needed standing up from a chair  using your arms (e.g., wheelchair or bedside chair)?: A Little Help needed to walk in hospital room?: A Little Help needed climbing 3-5 steps with a railing? : A Lot 6 Click Score: 17    End of Session Equipment Utilized During Treatment: Gait belt;Oxygen Activity Tolerance: Patient limited by fatigue;Patient limited by lethargy;Patient limited by pain Patient left: in bed;with call bell/phone within reach;with bed alarm set;with SCD's reapplied Nurse Communication: Mobility status;Patient requests pain meds;Precautions PT Visit Diagnosis: Unsteadiness on feet (R26.81);History of falling (Z91.81);Muscle weakness (generalized)  (M62.81);Difficulty in walking, not elsewhere classified (R26.2);Other abnormalities of gait and mobility (R26.89);Pain Pain - Right/Left: Left Pain - part of body: Hip    Time: 1015-1057 PT Time Calculation (min) (ACUTE ONLY): 42 min   Charges:   PT Evaluation $PT Eval Low Complexity: 1 Low PT Treatments $Therapeutic Activity: 23-37 mins        Patrina Levering PT, DPT 12/23/20 1:52 PM [phone]   [facility] 12/23/2020, 1:41 PM

## 2020-12-23 NOTE — Progress Notes (Signed)
Physical Therapy Treatment Patient Details Name: Shirley Evans MRN: [id number] DOB: [birth year] Today's Date: 12/23/2020   History of Present Illness Pt is 55 y/o s/p L total hip with anterior approach after mechanical fall. Pt positive for ETOH and cocaine on admission.    PT Comments    Pt received supine in bed, agreeable to therapy. Speech remains slurred and difficult to understand. Upon arrival, [place] is off pt, SpO2 72%. At end of session with [place] donned and 2L O2, SpO2 read 92%. Anterior THA therex packet was provided and performed this session. AAROM for SLR and hip ADD/ABD - pt otherwise able to perform independently. Will need follow up as pt was not engaged in session. Pt educated on BID status and plan to exit bed with PT tomorrow - pt verbalizes understanding. Would benefit from skilled PT to address above deficits and promote optimal return to PLOF.   Recommendations for follow up therapy are one component of a multi-disciplinary discharge planning process, led by the attending physician.  Recommendations may be updated based on patient status, additional functional criteria and insurance authorization.  Follow Up Recommendations  SNF;Supervision for mobility/OOB     Equipment Recommendations  Other (comment) (TBD at next venue of care)    Recommendations for Other Services       Precautions / Restrictions Precautions Precautions: Fall;Anterior Hip Precaution Comments: no hip precautions Restrictions Weight Bearing Restrictions: Yes LLE Weight Bearing: Weight bearing as tolerated     Mobility  Bed Mobility Overal bed mobility: Needs Assistance Bed Mobility: Supine to Sit;Sit to Supine Rolling: Mod assist   Supine to sit: Min assist Sit to supine: Min guard   General bed mobility comments: not performed due to pt pain - completed supine therex in anterior THA packet    Transfers Overall transfer level: Needs assistance Equipment used: Rolling walker (2  wheeled) Transfers: Sit to/from Stand Sit to Stand: Min guard;From elevated surface         General transfer comment: CGA to steady, VC for sequencing and decreased WB through LLE for comfort, elevated EOB.  Ambulation/Gait Ambulation/Gait assistance: Min guard Gait Distance (Feet): 6 Feet Assistive device: Rolling walker (2 wheeled) Gait Pattern/deviations: Step-to pattern;Decreased stance time - left;Decreased weight shift to right Gait velocity: significantly decreased   General Gait Details: 7ft forward ambulation, turn 90*, 36ft side steps to the right. CGA to steady. Minimal WB through LLE with UE support of RW. Fatigue and pain limiting distance and gait velocity.   Stairs             Wheelchair Mobility    Modified Rankin (Stroke Patients Only)       Balance Overall balance assessment: Needs assistance Sitting-balance support: Single extremity supported;Feet supported Sitting balance-Leahy Scale: Fair Sitting balance - Comments: leaning to the right on bed rail for comfort, is able to sit up without support with increased hip pain   Standing balance support: Bilateral upper extremity supported;During functional activity Standing balance-Leahy Scale: Poor Standing balance comment: Required CGA and RW during standing and ambualation - significant UE support with BUE shaking due to UE fatigue                            Cognition Arousal/Alertness: Awake/alert Behavior During Therapy: Restless;Anxious Overall Cognitive Status: Within Functional Limits for tasks assessed  General Comments: Pt A & O x 4 but with slurred speech      Exercises Total Joint Exercises Ankle Circles/Pumps: AROM;Both;20 reps;Supine Quad Sets: Strengthening;Both;10 reps;Supine Gluteal Sets: Strengthening;Both;10 reps;Supine Short Arc Quad: AROM;Strengthening;Left;10 reps;Supine Heel Slides: AROM;Strengthening;Left;10  reps;Supine Hip ABduction/ADduction: Strengthening;Left;10 reps;Supine;AAROM Straight Leg Raises: AAROM;Strengthening;Left;10 reps;Supine    General Comments General comments (skin integrity, edema, etc.): Pt on 2L O2 via [place]. During mobility, pt removed O2 and refused to put it back on. SpO2 decreased to 89%. Once back in bed and O2 back on, SpO2 returned to 95%.      Pertinent Vitals/Pain Pain Assessment: Faces Pain Score: 10-Worst pain ever Faces Pain Scale: Hurts worst Pain Location: 100/10 reported in L hip Pain Descriptors / Indicators: Aching;Constant;Sharp Pain Intervention(s): Limited activity within patient's tolerance;Monitored during session;Premedicated before session;Repositioned    Home Living Family/patient expects to be discharged to:: Private residence Living Arrangements: Spouse/significant other Available Help at Discharge: Family;Available PRN/intermittently Type of Home: House Home Access: Stairs to enter Entrance Stairs-Rails: Left Home Layout: One level Home Equipment: None Additional Comments: Does not use AD at baseline. She states she only leaves the house for doctors appointments. Reports 3 falls in the past 6 months - 2 falls day of admission leading to hip fx.    Prior Function Level of Independence: Independent      Comments: Pt reports living with boyfriend but that he "left for [place]" and reports her mother can assist her as needed. Pt reports ind prior to admission in all aspects of self care. Her boyfriend performs cooking/cleaning/grocery shopping.   PT Goals (current goals can now be found in the care plan section) Acute Rehab PT Goals Patient Stated Goal: "leg to stop hurting"    Frequency    BID      PT Plan      Co-evaluation              AM-PAC PT "6 Clicks" Mobility   Outcome Measure  Help needed turning from your back to your side while in a flat bed without using bedrails?: A Little Help needed moving from lying on  your back to sitting on the side of a flat bed without using bedrails?: A Little Help needed moving to and from a bed to a chair (including a wheelchair)?: A Little Help needed standing up from a chair using your arms (e.g., wheelchair or bedside chair)?: A Little Help needed to walk in hospital room?: A Little Help needed climbing 3-5 steps with a railing? : A Lot 6 Click Score: 17    End of Session Equipment Utilized During Treatment: Oxygen Activity Tolerance: Patient limited by fatigue;Patient limited by lethargy;Patient limited by pain Patient left: in bed;with call bell/phone within reach;with bed alarm set;with SCD's reapplied Nurse Communication: Mobility status PT Visit Diagnosis: Unsteadiness on feet (R26.81);History of falling (Z91.81);Muscle weakness (generalized) (M62.81);Difficulty in walking, not elsewhere classified (R26.2);Other abnormalities of gait and mobility (R26.89);Pain Pain - Right/Left: Left Pain - part of body: Hip     Time: 4010-2725 PT Time Calculation (min) (ACUTE ONLY): 20 min  Charges:  $Therapeutic Exercise: 8-22 mins $Therapeutic Activity: 23-37 mins                     Patrina Levering PT, DPT 12/23/20 4:02 PM [phone]    [facility] 12/23/2020, 3:58 PM

## 2020-12-23 NOTE — Progress Notes (Signed)
   Subjective: 1 Day Post-Op Procedure(s) (LRB): TOTAL HIP ARTHROPLASTY ANTERIOR APPROACH (Left) Patient reports pain as mild and moderate.   Patient is well, and has had no acute complaints or problems Denies any CP, SOB, ABD pain. We will continue therapy today.  Plan is to go Skilled nursing facility after hospital stay.  Objective: Vital signs in last 24 hours: Temp:  [97.6 F (36.4 C)-99.8 F (37.7 C)] 97.9 F (36.6 C) (09/30 0800) Pulse Rate:  [76-100] 100 (09/30 0800) Resp:  [10-22] 16 (09/30 0800) BP: (98-166)/(64-99) 166/99 (09/30 0800) SpO2:  [89 %-100 %] 98 % (09/30 0800)  Intake/Output from previous day: 09/29 0701 - 09/30 0700 In: 2835.2 [P.O.:30; I.V.:2611; IV Piggyback:194.1] Out: 1950 [Urine:1800; Blood:150] Intake/Output this shift: No intake/output data recorded.  Recent Labs    12/21/20 2139 12/22/20 0847 12/23/20 1014  HGB 15.7* 15.8* 13.9   Recent Labs    12/22/20 0847 12/23/20 1014  WBC 10.5 11.3*  RBC 4.55 3.91  HCT 44.6 39.0  PLT 184 171   Recent Labs    12/21/20 2139 12/22/20 0847  NA 139 136  K 3.3* 3.8  CL 99 99  CO2 27 25  BUN 15 9  CREATININE 0.95 0.54  GLUCOSE 122* 104*  CALCIUM 9.9 9.2   Recent Labs    12/21/20 2139  INR 0.9    EXAM General - Patient is Alert, Appropriate, and Oriented Extremity - Neurovascular intact Sensation intact distally Intact pulses distally Dorsiflexion/Plantar flexion intact Dressing - dressing C/D/I and no drainage Motor Function - intact, moving foot and toes well on exam.   Past Medical History:  Diagnosis Date   Cancer ([place])    cervical   COPD (chronic obstructive pulmonary disease) (HCC)    GERD (gastroesophageal reflux disease)    History of Helicobacter pylori infection    Hypertension    Menopausal state    Osteopenia    Postmenopausal atrophic vaginitis     Assessment/Plan:   1 Day Post-Op Procedure(s) (LRB): TOTAL HIP ARTHROPLASTY ANTERIOR APPROACH  (Left) Principal Problem:   Fracture of femoral neck, left, closed (HCC) Active Problems:   Acute alcohol intoxication (HCC)   Hypomagnesemia   Hypokalemia   Hypertension   COPD with chronic bronchitis (HCC)   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   Weight as of this encounter: 77.1 kg. Advance diet Up with therapy Pain well controlled VSS Labs stable Work on BM CM to assist with discharge to SNF  Lovenox 40 mg SQ daily x 14 days at discharge TED hose BLE x 6 weeks Follow up with [facility] in 2 weeks  DVT Prophylaxis - Lovenox, TED hose, and SCDs Weight-Bearing as tolerated to left leg   T. Rachelle Hora, PA-C [place] 12/23/2020, 10:52 AM

## 2020-12-23 NOTE — Evaluation (Signed)
Occupational Therapy Evaluation Patient Details Name: Shirley Evans MRN: [id number] DOB: [birth year] Today's Date: 12/23/2020   History of Present Illness Pt is 55 y/o s/p L total hip with anterior approach after mechanical fall. Pt positive for ETOH and cocaine on admission.   Clinical Impression   Patient presenting with decreased Ind in self care, balance, functional mobility/transfers, endurance, and safety awareness. Patient reports living at home with significant other and is independent in all aspects of care PTA. Pt's significant other now in [place] and pt reporting her mother could assist but unable to confirm. Pt appears to be slurring her works and reports "1000/10" pain in L hip with RN present to give medications. Pt then reports nausea and RN reports pt has not been eating. Pt begins to move LEs towards EOB with mod A but declines further secondary to feeling ill. Patient will benefit from acute OT to increase overall independence in the areas of ADLs, functional mobility, and safety awareness in order to safely discharge to next venue of care.     Recommendations for follow up therapy are one component of a multi-disciplinary discharge planning process, led by the attending physician.  Recommendations may be updated based on patient status, additional functional criteria and insurance authorization.   Follow Up Recommendations  SNF;Supervision - Intermittent    Equipment Recommendations  Other (comment) (defer to next venue of care)       Precautions / Restrictions Precautions Precautions: Fall;Anterior Hip Restrictions Weight Bearing Restrictions: Yes LLE Weight Bearing: Weight bearing as tolerated      Mobility Bed Mobility Overal bed mobility: Needs Assistance Bed Mobility: Rolling;Supine to Sit Rolling: Mod assist   Supine to sit: Mod assist          Transfers                 General transfer comment: Pt refused OOB secondary to nausea and  pain        ADL either performed or assessed with clinical judgement   ADL Overall ADL's : Needs assistance/impaired     Grooming: Wash/dry hands;Wash/dry face;Set up               Lower Body Dressing: Maximal assistance                       Vision Patient Visual Report: No change from baseline              Pertinent Vitals/Pain Pain Assessment: No/denies pain     Hand Dominance Right   Extremity/Trunk Assessment Upper Extremity Assessment Upper Extremity Assessment: Overall WFL for tasks assessed   Lower Extremity Assessment Lower Extremity Assessment: Defer to PT evaluation       Communication Communication Communication: Other (comment) (slurred speech)   Cognition Arousal/Alertness: Awake/alert Behavior During Therapy: Restless;Anxious Overall Cognitive Status: Within Functional Limits for tasks assessed                                 General Comments: Pt A & O x 4 but with slurred speech this session.              Home Living Family/patient expects to be discharged to:: Private residence Living Arrangements: Spouse/significant other Available Help at Discharge: Family;Available PRN/intermittently Type of Home: House Home Access: Stairs to enter [employer] of Steps: 6 Entrance Stairs-Rails: Right Home Layout: One level     Bathroom  Shower/Tub: Tub/shower unit         Home Equipment: None          Prior Functioning/Environment Level of Independence: Independent        Comments: Pt reports living with boyfriend but that he "left for [place]" and reports her mother can assist her as needed. Pt reports ind prior to admission in all aspects of self care.        OT Problem List: Decreased strength;Decreased activity tolerance;Impaired balance (sitting and/or standing);Decreased knowledge of use of DME or AE;Decreased safety awareness;Decreased range of motion      OT Treatment/Interventions:  Self-care/ADL training;Therapeutic exercise;Therapeutic activities;Energy conservation;DME and/or AE instruction;Patient/family education;Balance training;Manual therapy    OT Goals(Current goals can be found in the care plan section) Acute Rehab OT Goals Patient Stated Goal: "leg to stop hurting" OT Goal Formulation: With patient Time For Goal Achievement: 01/06/21 Potential to Achieve Goals: Good  OT Frequency: Min 2X/week   Barriers to D/C:    unsure if family could assist pt at [place] "6 Clicks" Daily Activity     Outcome Measure Help from another person eating meals?: None Help from another person taking care of personal grooming?: None Help from another person toileting, which includes using toliet, bedpan, or urinal?: A Lot Help from another person bathing (including washing, rinsing, drying)?: A Lot Help from another person to put on and taking off regular upper body clothing?: A Little Help from another person to put on and taking off regular lower body clothing?: A Lot 6 Click Score: 17   End of Session Nurse Communication: Mobility status  Activity Tolerance: Patient limited by pain Patient left: in bed;with call bell/phone within reach  OT Visit Diagnosis: Unsteadiness on feet (R26.81);Repeated falls (R29.6);Muscle weakness (generalized) (M62.81);Pain Pain - Right/Left: Left Pain - part of body: Hip                Time: 2563-8937 OT Time Calculation (min): 13 min Charges:  OT General Charges $OT Visit: 1 Visit OT Evaluation $OT Eval Moderate Complexity: 1 [address], [place], OTR/L , CBIS ascom [phone]  12/23/20, 1:11 PM

## 2020-12-23 NOTE — Anesthesia Postprocedure Evaluation (Signed)
Anesthesia Post Note  Patient: Shirley Evans  Procedure(s) Performed: TOTAL HIP ARTHROPLASTY ANTERIOR APPROACH (Left: Hip)  Patient location during evaluation: Nursing Unit Anesthesia Type: Spinal Level of consciousness: oriented and awake and alert Pain management: pain level controlled Vital Signs Assessment: post-procedure vital signs reviewed and stable Respiratory status: spontaneous breathing and respiratory function stable Cardiovascular status: blood pressure returned to baseline and stable Postop Assessment: no headache, no backache and no apparent nausea or vomiting Anesthetic complications: no   No notable events documented.   Last Vitals:  Vitals:   12/23/20 0545 12/23/20 0545  BP: (!) 162/99 (!) 162/99  Pulse: 98 95  Resp: 18 18  Temp: 37.1 C 37.1 C  SpO2: 100% 92%    Last Pain:  Vitals:   12/23/20 0545  TempSrc: Oral  PainSc:                  Brantley Fling

## 2020-12-23 NOTE — Care Management Important Message (Signed)
Important Message  Patient Details  Name: MELROSE KEARSE MRN: [id number] Date of Birth: [birth year]   Medicare Important Message Given:  N/A - LOS <3 / Initial given by admissions     Dannette Barbara 12/23/2020, 9:45 AM
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
## 2020-12-24 DIAGNOSIS — S72002A Fracture of unspecified part of neck of left femur, initial encounter for closed fracture: Secondary | ICD-10-CM | POA: Diagnosis not present

## 2020-12-24 DIAGNOSIS — E876 Hypokalemia: Secondary | ICD-10-CM

## 2020-12-24 DIAGNOSIS — F10929 Alcohol use, unspecified with intoxication, unspecified: Secondary | ICD-10-CM | POA: Diagnosis not present

## 2020-12-24 LAB — HEPATITIS PANEL, ACUTE
HCV Ab: NONREACTIVE
Hep A IgM: NONREACTIVE
Hep B C IgM: NONREACTIVE
Hepatitis B Surface Ag: NONREACTIVE

## 2020-12-24 LAB — CBC WITH DIFFERENTIAL/PLATELET
Abs Immature Granulocytes: 0.08 10*3/uL — ABNORMAL HIGH (ref 0.00–0.07)
Basophils Absolute: 0 10*3/uL (ref 0.0–0.1)
Basophils Relative: 0 %
Eosinophils Absolute: 0.1 10*3/uL (ref 0.0–0.5)
Eosinophils Relative: 1 %
HCT: 33.3 % — ABNORMAL LOW (ref 36.0–46.0)
Hemoglobin: 12 g/dL (ref 12.0–15.0)
Immature Granulocytes: 1 %
Lymphocytes Relative: 8 %
Lymphs Abs: 1 10*3/uL (ref 0.7–4.0)
MCH: 35.5 pg — ABNORMAL HIGH (ref 26.0–34.0)
MCHC: 36 g/dL (ref 30.0–36.0)
MCV: 98.5 fL (ref 80.0–100.0)
Monocytes Absolute: 1.7 10*3/uL — ABNORMAL HIGH (ref 0.1–1.0)
Monocytes Relative: 13 %
Neutro Abs: 9.8 10*3/uL — ABNORMAL HIGH (ref 1.7–7.7)
Neutrophils Relative %: 77 %
Platelets: 182 10*3/uL (ref 150–400)
RBC: 3.38 MIL/uL — ABNORMAL LOW (ref 3.87–5.11)
RDW: 12 % (ref 11.5–15.5)
WBC: 12.6 10*3/uL — ABNORMAL HIGH (ref 4.0–10.5)
nRBC: 0 % (ref 0.0–0.2)

## 2020-12-24 LAB — BASIC METABOLIC PANEL
Anion gap: 10 (ref 5–15)
BUN: 10 mg/dL (ref 6–20)
CO2: 22 mmol/L (ref 22–32)
Calcium: 8.3 mg/dL — ABNORMAL LOW (ref 8.9–10.3)
Chloride: 95 mmol/L — ABNORMAL LOW (ref 98–111)
Creatinine, Ser: 0.69 mg/dL (ref 0.44–1.00)
GFR, Estimated: >60 mL/min (ref >60)
Glucose, Bld: 119 mg/dL — ABNORMAL HIGH (ref 70–99)
Potassium: 3.9 mmol/L (ref 3.5–5.1)
Sodium: 127 mmol/L — ABNORMAL LOW (ref 135–145)

## 2020-12-24 LAB — PHOSPHORUS: Phosphorus: 2.3 mg/dL — ABNORMAL LOW (ref 2.5–4.6)

## 2020-12-24 LAB — MAGNESIUM: Magnesium: 2.3 mg/dL (ref 1.7–2.4)

## 2020-12-24 MED ORDER — POTASSIUM & SODIUM PHOSPHATES 280-160-250 MG PO PACK
1.0000 | PACK | Freq: Three times a day (TID) | ORAL | Status: AC
  Administered 2020-12-24 – 2020-12-25 (×3): 1 via ORAL
  Filled 2020-12-24 (×3): qty 1

## 2020-12-24 MED ORDER — K PHOS MONO-SOD PHOS DI & MONO 155-852-130 MG PO TABS
500.0000 mg | ORAL_TABLET | Freq: Once | ORAL | Status: AC
  Administered 2020-12-24: 500 mg via ORAL
  Filled 2020-12-24: qty 2

## 2020-12-24 NOTE — Progress Notes (Signed)
PROGRESS NOTE    Shirley Evans  NLG:[id number] DOB: 06/23/1965 DOA: 12/21/2020 PCP: Romualdo Bolk, FNP    Brief Narrative:  Shirley Evans is a 55 y.o. female with medical history significant for COPD, HTN, HLD, depression, alcohol and substance use (cocaine) who presented to the ED for evaluation after fall.  Patien had a lef femoral neck fracture, total hip arthroplasty was performed on 9/29.     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 Status post surgery. Patient is doing better today, started eating.  Will discontinue fluids. Still complaining of pain in the hip, continue oral pain medicine, discontinue Dilaudid.  Hyponatremia. Hypokalemia. Hypophosphatemia. Hypomagnesemia. Patient appears to be euvolemic.  We will continue to follow BMP. Potassium and magnesium normalized.  Patient has a mild hypophosphatemia, will start oral supplement.  Alcohol abuse. Polydrug abuse. Continue to follow, no alcohol withdrawal. Continue thiamine and folic acid.   Liver function changes. Most likely due to alcohol drinking. Hepatitis panel negative.   DVT prophylaxis: Lovenox Code Status: full Family Communication: Daughter updated at bedside Disposition Plan:      Status is: Inpatient   Remains inpatient appropriate because:IV treatments appropriate due to  Inpatient level of care appropriate due to severity of illness   Dispo: The patient is from: Home              Anticipated d/c is to: SNF              Patient currently is not medically stable to d/c.              Difficult to place patient No         I/O last 3 completed shifts: In: 2739.2  [I.V.:2345.1; IV Piggyback:394.1] Out: 900 [Urine:900] Total I/O In: 120 [P.O.:120] Out: -    Consultants:  Orthopedics   Procedures: Hip   Antimicrobials: None   Subjective: Patient doing better today, she started eating today, no nausea vomiting abdominal pain.  She has some constipation, receiving stool softeners. Still complaining of severe hip pain, continue pain medicine. Denies any short of breath or cough. No fever or chills No dysuria hematuria  Objective: Vitals:   12/24/20 0414 12/24/20 0812 12/24/20 1100 12/24/20 1200  BP: 114/76 130/83 135/84 114/78  Pulse: 86 91 96 89  Resp: 19 20  18   Temp: 98 F (36.7 C) 98.1 F (36.7 C)  98.5 F (36.9 C)  TempSrc: Oral     SpO2: 91% 92% 92% 92%  Weight:      Height:        Intake/Output Summary (Last 24 hours) at 12/24/2020 1312 Last data filed at 12/24/2020 1040 Gross per 24 hour  Intake 1820.08 ml  Output --  Net 1820.08 ml   Filed Weights   12/21/20 2057  Weight: 77.1 kg    Examination:  General exam: Appears calm and comfortable  Respiratory system: Clear to auscultation. Respiratory effort normal. Cardiovascular system: S1 & S2 heard, RRR. No JVD, murmurs, rubs, gallops or clicks. No pedal edema. Gastrointestinal system: Abdomen is nondistended, soft and nontender. No organomegaly or masses felt. Normal bowel sounds heard. Central nervous system: Alert and oriented x3. No  focal neurological deficits. Extremities: Symmetric 5 x 5 power. Skin: No rashes, lesions or ulcers Psychiatry: Judgement and insight appear normal. Mood & affect appropriate.     Data Reviewed: I have personally reviewed following labs and imaging studies  CBC: Recent Labs  Lab 12/21/20 2139 12/22/20 0847 12/23/20 1014 12/24/20 0506  WBC 8.3 10.5 11.3* 12.6*  NEUTROABS 4.5  --   --  9.8*  HGB 15.7* 15.8* 13.9 12.0  HCT 43.1 44.6 39.0 33.3*  MCV 96.0 98.0 99.7 98.5  PLT 196 184 171 294   Basic Metabolic Panel: Recent  Labs  Lab 12/21/20 2139 12/22/20 0847 12/23/20 1014 12/24/20 0506  NA 139 136 131* 127*  K 3.3* 3.8 3.3* 3.9  CL 99 99 93* 95*  CO2 27 25 27 22   GLUCOSE 122* 104* 133* 119*  BUN 15 9 8 10   CREATININE 0.95 0.54 0.65 0.69  CALCIUM 9.9 9.2 8.5* 8.3*  MG 1.1* 1.2* 0.8* 2.3  PHOS  --   --   --  2.3*   GFR: Estimated Creatinine Clearance: 79 mL/min (by C-G formula based on SCr of 0.69 mg/dL). Liver Function Tests: Recent Labs  Lab 12/21/20 2139 12/22/20 0847  AST 378* 231*  ALT 220* 185*  ALKPHOS 71 69  BILITOT 1.3* 1.7*  PROT 7.8 7.5  ALBUMIN 4.3 4.0   No results for input(s): LIPASE, AMYLASE in the last 168 hours. No results for input(s): AMMONIA in the last 168 hours. Coagulation Profile: Recent Labs  Lab 12/21/20 2139  INR 0.9   Cardiac Enzymes: No results for input(s): CKTOTAL, CKMB, CKMBINDEX, TROPONINI in the last 168 hours. BNP (last 3 results) No results for input(s): PROBNP in the last 8760 hours. HbA1C: No results for input(s): HGBA1C in the last 72 hours. CBG: No results for input(s): GLUCAP in the last 168 hours. Lipid Profile: No results for input(s): CHOL, HDL, LDLCALC, TRIG, CHOLHDL, LDLDIRECT in the last 72 hours. Thyroid Function Tests: No results for input(s): TSH, T4TOTAL, FREET4, T3FREE, THYROIDAB in the last 72 hours. Anemia Panel: No results for input(s): VITAMINB12, FOLATE, FERRITIN, TIBC, IRON, RETICCTPCT in the last 72 hours. Sepsis Labs: No results for input(s): PROCALCITON, LATICACIDVEN in the last 168 hours.  Recent Results (from the past 240 hour(s))  Resp Panel by RT-PCR (Flu A&B, Covid) Nasopharyngeal Swab     Status: None   Collection Time: 12/21/20 11:56 PM   Specimen: Nasopharyngeal Swab; Nasopharyngeal(NP) swabs in vial transport medium  Result Value Ref Range Status   SARS Coronavirus 2 by RT PCR NEGATIVE NEGATIVE Final    Comment: (NOTE) SARS-CoV-2 target nucleic acids are NOT DETECTED.  The SARS-CoV-2 RNA is generally  detectable in upper respiratory specimens during the acute phase of infection. The lowest concentration of SARS-CoV-2 viral copies this assay can detect is 138 copies/mL. A negative result does not preclude SARS-Cov-2 infection and should not be used as the sole basis for treatment or other patient management decisions. A negative result may occur with  improper specimen collection/handling, submission of specimen other than nasopharyngeal swab, presence of viral mutation(s) within the areas targeted by this assay, and inadequate number of viral copies(<138 copies/mL). A negative result must be combined with clinical observations, patient history, and epidemiological information. The expected result is Negative.  Fact Sheet for Patients:  EntrepreneurPulse.com.au  Fact Sheet for Healthcare Providers:  IncredibleEmployment.be  This test is no t yet approved or cleared by the [place] FDA and  has been authorized for detection and/or  diagnosis of SARS-CoV-2 by FDA under an Emergency Use Authorization (EUA). This EUA will remain  in effect (meaning this test can be used) for the duration of the COVID-19 declaration under Section 564(b)(1) of the Act, 21 U.S.C.section 360bbb-3(b)(1), unless the authorization is terminated  or revoked sooner.       Influenza A by PCR NEGATIVE NEGATIVE Final   Influenza B by PCR NEGATIVE NEGATIVE Final    Comment: (NOTE) The Xpert Xpress SARS-CoV-2/FLU/RSV plus assay is intended as an aid in the diagnosis of influenza from Nasopharyngeal swab specimens and should not be used as a sole basis for treatment. Nasal washings and aspirates are unacceptable for Xpert Xpress SARS-CoV-2/FLU/RSV testing.  Fact Sheet for Patients: EntrepreneurPulse.com.au  Fact Sheet for Healthcare Providers: IncredibleEmployment.be  This test is not yet approved or cleared by the [place] FDA  and has been authorized for detection and/or diagnosis of SARS-CoV-2 by FDA under an Emergency Use Authorization (EUA). This EUA will remain in effect (meaning this test can be used) for the duration of the COVID-19 declaration under Section 564(b)(1) of the Act, 21 U.S.C. section 360bbb-3(b)(1), unless the authorization is terminated or revoked.  Performed at [facility], [address] [place], [place] [address]          Radiology Studies: DG HIP OPERATIVE UNILAT W OR W/O PELVIS LEFT  Result Date: 12/22/2020 CLINICAL DATA:  Left hip replacement EXAM: OPERATIVE left HIP (WITH PELVIS IF PERFORMED) 3 VIEWS TECHNIQUE: Fluoroscopic spot image(s) were submitted for interpretation post-operatively. COMPARISON:  12/21/2020 FINDINGS: Three low resolution intraoperative spot views of the left hip including cine images. Total fluoroscopy time 6 seconds. The images demonstrate a left hip replacement with normal alignment. The femoral head component is not visualized on the submitted images. IMPRESSION: Intraoperative fluoroscopic assistance provided during left hip replacement surgery Electronically Signed   By: Donavan Foil M.D.   On: 12/22/2020 17:58   DG HIP UNILAT W OR W/O PELVIS 2-3 VIEWS LEFT  Result Date: 12/22/2020 CLINICAL DATA:  Postop hip arthroplasty EXAM: DG HIP (WITH OR WITHOUT PELVIS) 2-3V LEFT COMPARISON:  12/21/2020 FINDINGS: Interval left hip replacement with intact hardware and normal alignment. SI joint is patent. Pubic symphysis and left rami appear intact IMPRESSION: Left hip replacement.  No acute osseous abnormality Electronically Signed   By: Donavan Foil M.D.   On: 12/22/2020 17:56        Scheduled Meds:  Chlorhexidine Gluconate Cloth  6 each Topical Daily   docusate sodium  100 mg Oral BID   enoxaparin (LOVENOX) injection  40 mg Subcutaneous X79T   folic acid  1 mg Oral Daily   lisinopril  40 mg Oral Daily   magnesium hydroxide  30 mL Oral Daily    mometasone-formoterol  2 puff Inhalation BID   pantoprazole  40 mg Oral Daily   sertraline  100 mg Oral Daily   thiamine  100 mg Oral Daily   cyanocobalamin  1,000 mcg Oral Daily   Continuous Infusions:   LOS: 3 days    Time spent: 28 minutes    Sharen Hones, MD [facility]   To contact the attending provider between 7A-7P or the covering provider during after hours 7P-7A, please log into the web site [email or web address] and access using universal [place] password for that web site. If you do not have the password, please call the hospital operator.  12/24/2020, 1:12 PM

## 2020-12-24 NOTE — Progress Notes (Signed)
Physical Therapy Treatment Patient Details Name: Shirley Evans MRN: [id number] DOB: 10/09/1965 Today's Date: 12/24/2020   History of Present Illness Pt is 55 y/o s/p L total hip with anterior approach after mechanical fall. Pt positive for ETOH and cocaine on admission.    PT Comments    Author returned for 2nd/PM session. Her supportive mother present upon arriving but leaves at beginning of session. Pt is agreeable to PT session and OOB activity. Was on 2 L upon arriving with sao2 97%. Removed O2 during session and was able to maintain > 91% however has severe SOB with several rest required. She was able to exit bed, stand, and ambulate 120 ft with RW. Constant vcs for safety due to pt's impulsivity and poor safety awareness. 3 standing rest during gait training. HR elevated to 115 bpm. Endorses severe fatigue after gait training and request to rest. Min assist top get back into bed post ambulation. RN aware O2 left off. Pt will continue to benefit from SNF at DC to address deficits while improve safety and activity tolerance with ADLs.    Recommendations for follow up therapy are one component of a multi-disciplinary discharge planning process, led by the attending physician.  Recommendations may be updated based on patient status, additional functional criteria and insurance authorization.  Follow Up Recommendations  Supervision/Assistance - 24 hour;Supervision for mobility/OOB;SNF     Equipment Recommendations  Other (comment) (defer to next level of care)       Precautions / Restrictions Precautions Precautions: Fall;Anterior Hip Precaution Booklet Issued: Yes (comment) Precaution Comments: no hip precautions Restrictions Weight Bearing Restrictions: Yes LLE Weight Bearing: Weight bearing as tolerated     Mobility  Bed Mobility Overal bed mobility: Needs Assistance Bed Mobility: Supine to Sit;Sit to Supine Rolling: Supervision   Supine to sit: Supervision Sit to supine:  Min assist   General bed mobility comments: no assistance required to exit bed however min assist to get LLE back into bed after OOB activity    Transfers Overall transfer level: Needs assistance Equipment used: Rolling walker (2 wheeled) Transfers: Sit to/from Stand Sit to Stand: Min guard;From elevated surface         General transfer comment: Vcs for improved technique and slowing down. Pt tends to impulsively perform desired task  Ambulation/Gait Ambulation/Gait assistance: Min guard Gait Distance (Feet): 120 Feet Assistive device: Rolling walker (2 wheeled) Gait Pattern/deviations: Step-through pattern;Antalgic;Decreased stance time - left;Decreased step length - right Gait velocity: WNL   General Gait Details: pt was able to advance gait distances however needs several standing rest due to SOB/fatigue/ Started gait training on 2 L however was able to wean to rm air without desaturation. SOB noted throughout. HRelevated to 115 max during     Balance Overall balance assessment: Needs assistance Sitting-balance support: Feet supported Sitting balance-Leahy Scale: Good     Standing balance support: Bilateral upper extremity supported;During functional activity Standing balance-Leahy Scale: Fair Standing balance comment: pt's impulsivity and safety awareness makes her high fall risk         Cognition Arousal/Alertness: Awake/alert Behavior During Therapy: Restless;Impulsive Overall Cognitive Status: Within Functional Limits for tasks assessed      General Comments: Pt A & O x 4 but has poor safety awareness and is impulsive. constant vcs for improved safety.         General Comments General comments (skin integrity, edema, etc.): reviewed ther ex however pt  was unwilling to perform this session." I've been doing stuff all  day. I'm tired. can you come back later."      Pertinent Vitals/Pain Pain Assessment: 0-10 Pain Score: 6  Faces Pain Scale: Hurts even  more Pain Location: LBP/ L hip pain Pain Descriptors / Indicators: Aching;Constant;Sharp Pain Intervention(s): Limited activity within patient's tolerance;Monitored during session;Repositioned;Patient requesting pain meds-RN notified     PT Goals (current goals can now be found in the care plan section) Acute Rehab PT Goals Patient Stated Goal: rehab then home Progress towards PT goals: Progressing toward goals    Frequency    BID      PT Plan Current plan remains appropriate       AM-PAC PT "6 Clicks" Mobility   Outcome Measure  Help needed turning from your back to your side while in a flat bed without using bedrails?: A Little Help needed moving from lying on your back to sitting on the side of a flat bed without using bedrails?: A Little Help needed moving to and from a bed to a chair (including a wheelchair)?: A Little Help needed standing up from a chair using your arms (e.g., wheelchair or bedside chair)?: A Little Help needed to walk in hospital room?: A Little Help needed climbing 3-5 steps with a railing? : A Lot 6 Click Score: 17    End of Session Equipment Utilized During Treatment: Oxygen;Other (comment) (On rm air at conclusion of session with RN aware) Activity Tolerance: Patient limited by fatigue Patient left: in bed;with call bell/phone within reach;with bed alarm set;with SCD's reapplied Nurse Communication: Mobility status PT Visit Diagnosis: Unsteadiness on feet (R26.81);History of falling (Z91.81);Muscle weakness (generalized) (M62.81);Difficulty in walking, not elsewhere classified (R26.2);Other abnormalities of gait and mobility (R26.89);Pain Pain - Right/Left: Left Pain - part of body: Hip     Time: 1445-1510 PT Time Calculation (min) (ACUTE ONLY): 25 min  Charges:  $Gait Training: 8-22 mins $Therapeutic Activity: 8-22 mins                     Julaine Fusi PTA 12/24/20, 3:37 PM

## 2020-12-24 NOTE — Progress Notes (Signed)
Physical Therapy Treatment Patient Details Name: Shirley Evans MRN: [id number] DOB: 1965-08-05 Today's Date: 12/24/2020   History of Present Illness Pt is 55 y/o s/p L total hip with anterior approach after mechanical fall. Pt positive for ETOH and cocaine on admission.    PT Comments    Pt's bed alarm going off upon arriving. She was attempting to exit bed to get to [facility]. "I've been having diarrhea all morning." Author reviewed importance of use of call bell for safety. Pt is A and O x 4 and states understanding. Impulsive throughout session. On rm air but gets SOB with minimal activity. Desaturates to 82 % on rm air. Reapplied O2 and made RN aware. Pt did successfully have loose BM prior to returning to bed. She is severely deconditioned with wheezing noted after minimal activity. Required min assist to progress LEs back into bed. Pt will benefit form SNF at DC to address deficits while maximizing independence with ADLs. Bed alarm in place with call bell in reach post session.     Recommendations for follow up therapy are one component of a multi-disciplinary discharge planning process, led by the attending physician.  Recommendations may be updated based on patient status, additional functional criteria and insurance authorization.  Follow Up Recommendations  Supervision/Assistance - 24 hour;SNF;Supervision for mobility/OOB     Equipment Recommendations  Other (comment) (defer to next level of care)       Precautions / Restrictions Precautions Precautions: Fall;Anterior Hip Precaution Comments: no hip precautions Restrictions Weight Bearing Restrictions: Yes LLE Weight Bearing: Weight bearing as tolerated     Mobility  Bed Mobility Overal bed mobility: Needs Assistance Bed Mobility: Supine to Sit;Sit to Supine Rolling: Supervision   Supine to sit: Supervision Sit to supine: Min assist   General bed mobility comments: Min assist to progress LEs back into bed     Transfers Overall transfer level: Needs assistance Equipment used: Rolling walker (2 wheeled) Transfers: Sit to/from Stand Sit to Stand: Min guard;From elevated surface         General transfer comment: CGA for safety and due to impulsivity  Ambulation/Gait Ambulation/Gait assistance: Min guard Gait Distance (Feet): 3 Feet Assistive device: Rolling walker (2 wheeled) Gait Pattern/deviations: Step-to pattern;Decreased stance time - left;Decreased weight shift to right Gait velocity: significantly decreased   General Gait Details: pt ambulated to [facility] and return. Pt have very loose BM prior to getting back to bed. did require assistance with hygiene care after movement. Pt gets SOB quickly with exertion. on rm air upon start of session however desaturates to 82 %. RN made aware. O2 reapplied    Balance Overall balance assessment: Needs assistance Sitting-balance support: Feet supported Sitting balance-Leahy Scale: Good     Standing balance support: Bilateral upper extremity supported;During functional activity Standing balance-Leahy Scale: Poor Standing balance comment: pt's impulsivity and safety awareness makes her high fall risk      Cognition Arousal/Alertness: Awake/alert Behavior During Therapy: Restless;Impulsive Overall Cognitive Status: Within Functional Limits for tasks assessed      General Comments: Pt A & O x 4 but has poor safety awareness and is impulsive. constant vcs for improved safety.         General Comments General comments (skin integrity, edema, etc.): reviewed ther ex however pt  was unwilling to perform this session." I've been doing stuff all day. I'm tired. can you come back later."      Pertinent Vitals/Pain Pain Assessment: 0-10 Pain Score: 4  Faces Pain Scale:  Hurts a little bit Pain Location: LBP Pain Descriptors / Indicators: Aching;Constant;Sharp Pain Intervention(s): Limited activity within patient's tolerance;Monitored during  session;Premedicated before session;Repositioned     PT Goals (current goals can now be found in the care plan section) Acute Rehab PT Goals Patient Stated Goal: none stated Progress towards PT goals: Progressing toward goals    Frequency    BID      PT Plan Current plan remains appropriate       AM-PAC PT "6 Clicks" Mobility   Outcome Measure  Help needed turning from your back to your side while in a flat bed without using bedrails?: A Little Help needed moving from lying on your back to sitting on the side of a flat bed without using bedrails?: A Little Help needed moving to and from a bed to a chair (including a wheelchair)?: A Little Help needed standing up from a chair using your arms (e.g., wheelchair or bedside chair)?: A Little Help needed to walk in hospital room?: A Little Help needed climbing 3-5 steps with a railing? : A Lot 6 Click Score: 17    End of Session Equipment Utilized During Treatment: Oxygen (2 L) Activity Tolerance: Patient limited by fatigue;Other (comment) (self limiting this session) Patient left: in bed;with call bell/phone within reach;with bed alarm set;with SCD's reapplied Nurse Communication: Mobility status PT Visit Diagnosis: Unsteadiness on feet (R26.81);History of falling (Z91.81);Muscle weakness (generalized) (M62.81);Difficulty in walking, not elsewhere classified (R26.2);Other abnormalities of gait and mobility (R26.89);Pain Pain - Right/Left: Left Pain - part of body: Hip     Time: 1130-1146 PT Time Calculation (min) (ACUTE ONLY): 16 min  Charges:  $Therapeutic Activity: 8-22 mins                     Julaine Fusi PTA 12/24/20, 3:26 PM

## 2020-12-24 NOTE — Progress Notes (Addendum)
   Subjective: 2 Days Post-Op Procedure(s) (LRB): TOTAL HIP ARTHROPLASTY ANTERIOR APPROACH (Left) Patient reports pain as mild and moderate.   Patient is well, and has had no acute complaints or problems Denies any CP, SOB, ABD pain. We will continue therapy today.  Plan is to go Skilled nursing facility after hospital stay.  Objective: Vital signs in last 24 hours: Temp:  [97.6 F (36.4 C)-98.1 F (36.7 C)] 98.1 F (36.7 C) (10/01 0812) Pulse Rate:  [74-99] 91 (10/01 0812) Resp:  [18-20] 20 (10/01 0812) BP: (91-130)/(58-83) 130/83 (10/01 0812) SpO2:  [82 %-93 %] 92 % (10/01 0812)  Intake/Output from previous day: 09/30 0701 - 10/01 0700 In: 1700.1 [I.V.:1500.1; IV Piggyback:200] Out: -  Intake/Output this shift: No intake/output data recorded.  Recent Labs    12/21/20 2139 12/22/20 0847 12/23/20 1014 12/24/20 0506  HGB 15.7* 15.8* 13.9 12.0   Recent Labs    12/23/20 1014 12/24/20 0506  WBC 11.3* 12.6*  RBC 3.91 3.38*  HCT 39.0 33.3*  PLT 171 182   Recent Labs    12/23/20 1014 12/24/20 0506  NA 131* 127*  K 3.3* 3.9  CL 93* 95*  CO2 27 22  BUN 8 10  CREATININE 0.65 0.69  GLUCOSE 133* 119*  CALCIUM 8.5* 8.3*   Recent Labs    12/21/20 2139  INR 0.9    EXAM General - Patient is Alert, Appropriate, and Oriented Extremity - Neurovascular intact Sensation intact distally Intact pulses distally Dorsiflexion/Plantar flexion intact Dressing - dressing C/D/I and no drainage, Praveena intact without drainage Motor Function - intact, moving foot and toes well on exam.   Past Medical History:  Diagnosis Date   Cancer (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   Weight as of this encounter: 77.1 kg. Advance diet Up with therapy Pain well controlled VSS Labs stable Work on BM CM to assist with discharge to SNF  Lovenox 40 mg SQ daily x 14 days at discharge TED hose BLE x 6 weeks Follow up with [facility] in 2 weeks  DVT Prophylaxis - Lovenox, TED hose, and SCDs Weight-Bearing as tolerated to left leg   T. Rachelle Hora, PA-C [place] 12/24/2020, 8:30 AM

## 2020-12-24 NOTE — Progress Notes (Signed)
Nurse supervisor notified.

## 2020-12-24 NOTE — Progress Notes (Signed)
Occupational Therapy Treatment Patient Details Name: Shirley Evans MRN: [id number] DOB: [birth year] Today's Date: 12/24/2020   History of present illness Pt is 55 y/o s/p L total hip with anterior approach after mechanical fall. Pt positive for ETOH and cocaine on admission.   OT comments  Pt supine in bed on phone. Stop conversation and willing to work with OT. Report having BSC next to bed to transfer too.  Pt report pain 9/10- but facial expression and participation in OT do no support 9/10. Pt impulsive and fast but did not show LOB in Bed <> BSC transfer- Supervision , in clothing management and hygiene. LB dressing socks and pants SBA for R foot.  Pt cont to show decreased Ind in self care, balance, functional mobility/transfers, endurance, and safety awareness. Pt's O2 decrease after session to 87% and HR 105 but with rest and deep breathing O2 increase to 93% and HR decrease to 91. Patient will benefit from acute OT to increase overall independence in the areas of ADLs, functional mobility, and safety awareness in order to safely discharge to next venue of care.     Recommendations for follow up therapy are one component of a multi-disciplinary discharge planning process, led by the attending physician.  Recommendations may be updated based on patient status, additional functional criteria and insurance authorization.    Follow Up Recommendations       Equipment Recommendations       Recommendations for Other Services      Precautions / Restrictions Precautions Precautions: Fall;Anterior Hip Precaution Booklet Issued: Yes (comment) Precaution Comments: no hip precautions Restrictions Weight Bearing Restrictions: Yes LLE Weight Bearing: Weight bearing as tolerated       Mobility Bed Mobility Overal bed mobility: Needs Assistance Bed Mobility: Supine to Sit;Sit to Supine Rolling: Supervision   Supine to sit: Supervision Sit to supine: Min assist   General bed mobility  comments: no assistance required to exit bed however min assist to get LLE back into bed after OOB activity    Transfers Overall transfer level: Needs assistance Equipment used: Rolling walker (2 wheeled) Transfers: Sit to/from Stand Sit to Stand: Min guard;From elevated surface         General transfer comment: Vcs for improved technique and slowing down. Pt tends to impulsively perform desired task    Balance Overall balance assessment: Needs assistance Sitting-balance support: Feet supported Sitting balance-Leahy Scale: Good     Standing balance support: Bilateral upper extremity supported;During functional activity Standing balance-Leahy Scale: Fair Standing balance comment: pt's impulsivity and safety awareness makes her high fall risk                           ADL either performed or assessed with clinical judgement   ADL                                               Vision       Perception     Praxis      Cognition Arousal/Alertness: Awake/alert Behavior During Therapy: Impulsive Overall Cognitive Status: Within Functional Limits for tasks assessed                                 General Comments: Pt A & O x 4  but has poor safety awareness and is impulsive. constant vcs for improved safety.        Exercises Other Exercises Other Exercises: LB dressing R sock and compression independeng donn and doff in bed, R sock doff independent , donn Min A - in bed Other Exercises: Toilet transfer [place]  next to bed - pt awere of wound vac , clothing managment I with supervision - BED <> BSC next to bed S - no LOB   Shoulder Instructions       General Comments reviewed ther ex however pt  was unwilling to perform this session." I've been doing stuff all day. I'm tired. can you come back later."    Pertinent Vitals/ Pain       Pain Assessment: 0-10 Pain Score: 9  Faces Pain Scale: Hurts even more Pain Location: LBP/ L  hip pain Pain Descriptors / Indicators: Aching;Constant;Sharp Pain Intervention(s): Monitored during session  Home Living Family/patient expects to be discharged to:: Private residence Living Arrangements: Spouse/significant other Available Help at Discharge: Family;Available PRN/intermittently Type of Home: House             Bathroom Shower/Tub: Tub/shower unit         Home Equipment: None   Additional Comments: Does not use AD at baseline. She states she only leaves the house for doctors appointments. Reports 3 falls in the past 6 months - 2 falls day of admission leading to hip fx.      Prior Functioning/Environment          Comments: Pt reports living with boyfriend but that he "left for [place]" and reports her mother can assist her as needed. Pt reports ind prior to admission in all aspects of self care. Her boyfriend performs cooking/cleaning/grocery shopping.   Frequency  Min 2X/week        Progress Toward Goals  OT Goals(current goals can now be found in the care plan section)     Acute Rehab OT Goals Patient Stated Goal: rehab then home OT Goal Formulation: With patient Time For Goal Achievement: 01/06/21 Potential to Achieve Goals: Good  Plan      Co-evaluation                 AM-PAC OT "6 Clicks" Daily Activity     Outcome Measure                    End of Session    OT Visit Diagnosis: Unsteadiness on feet (R26.81);Repeated falls (R29.6);Muscle weakness (generalized) (M62.81);Pain Pain - Right/Left: Left Pain - part of body: Hip   Activity Tolerance Patient tolerated treatment well   Patient Left in bed;with call bell/phone within reach   Nurse Communication          Time: 5053-9767 OT Time Calculation (min): 24 min  Charges: OT Treatments $Self Care/Home Management : 23-37 mins  Progressing to goals  Rosalyn Gess OTR/L,CLT 12/24/2020, 3:53 PM

## 2020-12-25 DIAGNOSIS — E876 Hypokalemia: Secondary | ICD-10-CM | POA: Diagnosis not present

## 2020-12-25 DIAGNOSIS — S72002A Fracture of unspecified part of neck of left femur, initial encounter for closed fracture: Secondary | ICD-10-CM | POA: Diagnosis not present

## 2020-12-25 LAB — BASIC METABOLIC PANEL
Anion gap: 7 (ref 5–15)
BUN: 9 mg/dL (ref 6–20)
CO2: 30 mmol/L (ref 22–32)
Calcium: 9.3 mg/dL (ref 8.9–10.3)
Chloride: 97 mmol/L — ABNORMAL LOW (ref 98–111)
Creatinine, Ser: 0.71 mg/dL (ref 0.44–1.00)
GFR, Estimated: >60 mL/min (ref >60)
Glucose, Bld: 89 mg/dL (ref 70–99)
Potassium: 4.1 mmol/L (ref 3.5–5.1)
Sodium: 134 mmol/L — ABNORMAL LOW (ref 135–145)

## 2020-12-25 LAB — CBC WITH DIFFERENTIAL/PLATELET
Abs Immature Granulocytes: 0.06 10*3/uL (ref 0.00–0.07)
Basophils Absolute: 0 10*3/uL (ref 0.0–0.1)
Basophils Relative: 0 %
Eosinophils Absolute: 0.1 10*3/uL (ref 0.0–0.5)
Eosinophils Relative: 1 %
HCT: 33.9 % — ABNORMAL LOW (ref 36.0–46.0)
Hemoglobin: 12.4 g/dL (ref 12.0–15.0)
Immature Granulocytes: 1 %
Lymphocytes Relative: 12 %
Lymphs Abs: 1.4 10*3/uL (ref 0.7–4.0)
MCH: 36.3 pg — ABNORMAL HIGH (ref 26.0–34.0)
MCHC: 36.6 g/dL — ABNORMAL HIGH (ref 30.0–36.0)
MCV: 99.1 fL (ref 80.0–100.0)
Monocytes Absolute: 1.6 10*3/uL — ABNORMAL HIGH (ref 0.1–1.0)
Monocytes Relative: 14 %
Neutro Abs: 8.1 10*3/uL — ABNORMAL HIGH (ref 1.7–7.7)
Neutrophils Relative %: 72 %
Platelets: 212 10*3/uL (ref 150–400)
RBC: 3.42 MIL/uL — ABNORMAL LOW (ref 3.87–5.11)
RDW: 12 % (ref 11.5–15.5)
WBC: 11.2 10*3/uL — ABNORMAL HIGH (ref 4.0–10.5)
nRBC: 0 % (ref 0.0–0.2)

## 2020-12-25 LAB — MAGNESIUM: Magnesium: 1.4 mg/dL — ABNORMAL LOW (ref 1.7–2.4)

## 2020-12-25 LAB — PHOSPHORUS: Phosphorus: 2.5 mg/dL (ref 2.5–4.6)

## 2020-12-25 MED ORDER — OXYCODONE-ACETAMINOPHEN 7.5-325 MG PO TABS: 1.0000 | ORAL_TABLET | ORAL | 0 refills | Status: DC | PRN

## 2020-12-25 MED ORDER — MAGNESIUM SULFATE 4 GM/100ML IV SOLN
4.0000 g | Freq: Once | INTRAVENOUS | Status: AC
  Administered 2020-12-25: 4 g via INTRAVENOUS
  Filled 2020-12-25: qty 100

## 2020-12-25 MED ORDER — ENOXAPARIN SODIUM 40 MG/0.4ML IJ SOSY: 40.0000 mg | PREFILLED_SYRINGE | INTRAMUSCULAR | 0 refills | Status: DC

## 2020-12-25 NOTE — Progress Notes (Addendum)
Physical Therapy Treatment Patient Details Name: Shirley Evans MRN: [id number] DOB: Dec 08, 1965 Today's Date: 12/25/2020   History of Present Illness Pt is 56 y/o s/p L total hip with anterior approach after mechanical fall. Pt positive for ETOH and cocaine on admission.    PT Comments    In and out of bed with ease.  Stands and is able to walk to bathroom to void then complete lap x 1 around unit.  Returned later for and additional lap and stair training.  She goes up with ease with step over step pattern.  Pt continues with some impulsivity but overall does well with gait with no LOB or buckling.  Sats on room air remain >91% before and after gait but SOB is noted.  She declines to be up in recliner stating she spends 22 hours a day in bed at home.  Discharge recommendations updated to HHPT given improvements in mobility.  Stated her boyfriend is out of town but her mother will be able to stay with her for supervision. She stated she prefers to go home vs SNF and feels comfortable with decision.  Safety education stressed.   Recommendations for follow up therapy are one component of a multi-disciplinary discharge planning process, led by the attending physician.  Recommendations may be updated based on patient status, additional functional criteria and insurance authorization.  Follow Up Recommendations  HHPT     Equipment Recommendations  Rolling walker with 5" wheels (defer to next level of care)    Recommendations for Other Services       Precautions / Restrictions Precautions Precautions: Fall;Anterior Hip Precaution Comments: no hip precautions Restrictions Weight Bearing Restrictions: Yes LLE Weight Bearing: Weight bearing as tolerated     Mobility  Bed Mobility Overal bed mobility: Modified Independent                  Transfers Overall transfer level: Needs assistance Equipment used: Rolling walker (2 wheeled) Transfers: Sit to/from Stand Sit to Stand:  Supervision            Ambulation/Gait Ambulation/Gait assistance: Min guard Gait Distance (Feet): 200 Feet Assistive device: Rolling walker (2 wheeled) Gait Pattern/deviations: Step-through pattern;Antalgic;Decreased stance time - left;Decreased step length - right         Stairs Stairs: Yes Stairs assistance: Supervision Stair Management: Alternating pattern Number of Stairs: 4 General stair comments: with ease   Wheelchair Mobility    Modified Rankin (Stroke Patients Only)       Balance Overall balance assessment: Needs assistance Sitting-balance support: Feet supported Sitting balance-Leahy Scale: Good     Standing balance support: Bilateral upper extremity supported;During functional activity Standing balance-Leahy Scale: Fair                              Cognition Arousal/Alertness: Awake/alert Behavior During Therapy: Impulsive Overall Cognitive Status: Within Functional Limits for tasks assessed                                        Exercises      General Comments        Pertinent Vitals/Pain Pain Assessment: Faces Faces Pain Scale: Hurts a little bit Pain Location: LBP/ L hip pain Pain Descriptors / Indicators: Aching;Constant;Sore Pain Intervention(s): Monitored during session    Home Living  Prior Function            PT Goals (current goals can now be found in the care plan section) Progress towards PT goals: Progressing toward goals    Frequency    BID      PT Plan Discharge plan needs to be updated    Co-evaluation              AM-PAC PT "6 Clicks" Mobility   Outcome Measure  Help needed turning from your back to your side while in a flat bed without using bedrails?: None Help needed moving from lying on your back to sitting on the side of a flat bed without using bedrails?: A Little Help needed moving to and from a bed to a chair (including a  wheelchair)?: A Little Help needed standing up from a chair using your arms (e.g., wheelchair or bedside chair)?: A Little Help needed to walk in hospital room?: A Little Help needed climbing 3-5 steps with a railing? : A Lot 6 Click Score: 18    End of Session Equipment Utilized During Treatment: Oxygen;Other (comment) (On rm air at conclusion of session with RN aware) Activity Tolerance: Patient limited by fatigue Patient left: in bed;with call bell/phone within reach;with bed alarm set;with SCD's reapplied Nurse Communication: Mobility status PT Visit Diagnosis: Unsteadiness on feet (R26.81);History of falling (Z91.81);Muscle weakness (generalized) (M62.81);Difficulty in walking, not elsewhere classified (R26.2);Other abnormalities of gait and mobility (R26.89);Pain Pain - Right/Left: Left Pain - part of body: Hip     Time: 1010-1035 PT Time Calculation (min) (ACUTE ONLY): 25 min  Charges:  $Gait Training: 23-37 mins                   Chesley Noon, PTA 12/25/20, 11:36 AM

## 2020-12-25 NOTE — Progress Notes (Signed)
PROGRESS NOTE    Shirley Evans  BSW:[id number] DOB: [birth year] DOA: 12/21/2020 PCP: Romualdo Bolk, FNP   Follow-up on hip fracture. Brief Narrative:  Shirley Evans is a 55 y.o. female with medical history significant for COPD, HTN, HLD, depression, alcohol and substance use (cocaine) who presented to the ED for evaluation after fall.  Patien had a lef femoral neck fracture, total hip arthroplasty was performed on 9/29.   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 Status post surgery. Patient doing well, still complains of pain in the hip.  Appetite improving, no nausea vomiting.  Continue as needed oral pain medicine. Hemoglobin normal.  Hyponatremia. Hypokalemia. Hypophosphatemia. Hypomagnesemia. Sodium level close to normal.  Potassium and phosphorus normalized.  Continue replete magnesium.  Recheck magnesium level tomorrow.  Alcohol abuse. Polydrug abuse. Alcoholic hepatitis No evidence of alcohol withdrawal. Continue thiamine and folic acid orally. Hepatitis panel negative.   DVT prophylaxis: Lovenox Code Status: full Family Communication:  Disposition Plan:      Status is: Inpatient   Remains inpatient appropriate because:IV treatments appropriate due to  Inpatient level of care appropriate due to severity of illness   Dispo: The patient is from: Home              Anticipated d/c is to: SNF              Patient currently is not medically stable to d/c.              Difficult to place patient No       I/O last 3 completed shifts: In: 2060.1 [P.O.:360; I.V.:1500.1; IV Piggyback:200] Out: -  Total I/O In: 240 [P.O.:240] Out: -     Consultants:   Orthopedics   Procedures: Hip   Antimicrobials: None    Subjective: Patient doing well.  Still complaining of pain in the hip.  But she was able to walk with physical therapy yesterday. She had a bowel movement yesterday, no nausea vomiting abdominal pain. No short of breath or cough. No fever or chills. No dysuria or hematuria.  Objective: Vitals:   12/24/20 1557 12/24/20 1925 12/25/20 0440 12/25/20 0835  BP: 120/78 128/73 (!) 151/97 (!) 176/97  Pulse: 88 92 84 97  Resp: 15 17 18 18   Temp: 97.9 F (36.6 C) 98.2 F (36.8 C) 98.4 F (36.9 C) 98.4 F (36.9 C)  TempSrc: Oral     SpO2: 90% 90% 94% 96%  Weight:      Height:        Intake/Output Summary (Last 24 hours) at 12/25/2020 1115 Last data filed at 12/25/2020 1012 Gross per 24 hour  Intake 480 ml  Output --  Net 480 ml   Filed Weights   12/21/20 2057  Weight: 77.1 kg    Examination:  General exam: Appears calm and comfortable  Respiratory system: Clear to auscultation. Respiratory effort normal. Cardiovascular system: S1 & S2 heard, RRR. No JVD, murmurs, rubs, gallops or clicks. No pedal edema. Gastrointestinal system: Abdomen is nondistended, soft and nontender. No organomegaly or masses felt. Normal bowel sounds heard. Central nervous system: Alert and oriented. No focal neurological deficits. Extremities: Symmetric 5 x 5 power.  Skin: No rashes, lesions or ulcers Psychiatry: Judgement and insight appear normal. Mood & affect appropriate.     Data Reviewed: I have personally reviewed following labs and imaging studies  CBC: Recent Labs  Lab 12/21/20 2139 12/22/20 0847 12/23/20 1014 12/24/20 0506 12/25/20 0545  WBC 8.3 10.5 11.3* 12.6* 11.2*  NEUTROABS 4.5  --   --  9.8* 8.1*  HGB 15.7* 15.8* 13.9 12.0 12.4  HCT 43.1 44.6 39.0 33.3* 33.9*  MCV 96.0 98.0 99.7 98.5 99.1  PLT 196 184 171 182 625   Basic Metabolic Panel: Recent Labs  Lab 12/21/20 2139 12/22/20 0847 12/23/20 1014  12/24/20 0506 12/25/20 0545  NA 139 136 131* 127* 134*  K 3.3* 3.8 3.3* 3.9 4.1  CL 99 99 93* 95* 97*  CO2 27 25 27 22 30   GLUCOSE 122* 104* 133* 119* 89  BUN 15 9 8 10 9   CREATININE 0.95 0.54 0.65 0.69 0.71  CALCIUM 9.9 9.2 8.5* 8.3* 9.3  MG 1.1* 1.2* 0.8* 2.3 1.4*  PHOS  --   --   --  2.3* 2.5   GFR: Estimated Creatinine Clearance: 79 mL/min (by C-G formula based on SCr of 0.71 mg/dL). Liver Function Tests: Recent Labs  Lab 12/21/20 2139 12/22/20 0847  AST 378* 231*  ALT 220* 185*  ALKPHOS 71 69  BILITOT 1.3* 1.7*  PROT 7.8 7.5  ALBUMIN 4.3 4.0   No results for input(s): LIPASE, AMYLASE in the last 168 hours. No results for input(s): AMMONIA in the last 168 hours. Coagulation Profile: Recent Labs  Lab 12/21/20 2139  INR 0.9   Cardiac Enzymes: No results for input(s): CKTOTAL, CKMB, CKMBINDEX, TROPONINI in the last 168 hours. BNP (last 3 results) No results for input(s): PROBNP in the last 8760 hours. HbA1C: No results for input(s): HGBA1C in the last 72 hours. CBG: No results for input(s): GLUCAP in the last 168 hours. Lipid Profile: No results for input(s): CHOL, HDL, LDLCALC, TRIG, CHOLHDL, LDLDIRECT in the last 72 hours. Thyroid Function Tests: No results for input(s): TSH, T4TOTAL, FREET4, T3FREE, THYROIDAB in the last 72 hours. Anemia Panel: No results for input(s): VITAMINB12, FOLATE, FERRITIN, TIBC, IRON, RETICCTPCT in the last 72 hours. Sepsis Labs: No results for input(s): PROCALCITON, LATICACIDVEN in the last 168 hours.  Recent Results (from the past 240 hour(s))  Resp Panel by RT-PCR (Flu A&B, Covid) Nasopharyngeal Swab     Status: None   Collection Time: 12/21/20 11:56 PM   Specimen: Nasopharyngeal Swab; Nasopharyngeal(NP) swabs in vial transport medium  Result Value Ref Range Status   SARS Coronavirus 2 by RT PCR NEGATIVE NEGATIVE Final    Comment: (NOTE) SARS-CoV-2 target nucleic acids are NOT DETECTED.  The SARS-CoV-2 RNA is generally  detectable in upper respiratory specimens during the acute phase of infection. The lowest concentration of SARS-CoV-2 viral copies this assay can detect is 138 copies/mL. A negative result does not preclude SARS-Cov-2 infection and should not be used as the sole basis for treatment or other patient management decisions. A negative result may occur with  improper specimen collection/handling, submission of specimen other than nasopharyngeal swab, presence of viral mutation(s) within the areas targeted by this assay, and inadequate number of viral copies(<138 copies/mL). A negative result must be combined with clinical observations, patient history, and epidemiological information. The expected result is Negative.  Fact Sheet for Patients:  EntrepreneurPulse.com.au  Fact Sheet for Healthcare Providers:  IncredibleEmployment.be  This test is no t yet approved or cleared by the  [place] Architectural technologist and  has been authorized for detection and/or diagnosis of SARS-CoV-2 by FDA under an Print production planner (EUA). This EUA will remain  in effect (meaning this test can be used) for the duration of the COVID-19 declaration under Section 564(b)(1) of the Act, 21 U.S.C.section 360bbb-3(b)(1), unless the authorization is terminated  or revoked sooner.       Influenza A by PCR NEGATIVE NEGATIVE Final   Influenza B by PCR NEGATIVE NEGATIVE Final    Comment: (NOTE) The Xpert Xpress SARS-CoV-2/FLU/RSV plus assay is intended as an aid in the diagnosis of influenza from Nasopharyngeal swab specimens and should not be used as a sole basis for treatment. Nasal washings and aspirates are unacceptable for Xpert Xpress SARS-CoV-2/FLU/RSV testing.  Fact Sheet for Patients: EntrepreneurPulse.com.au  Fact Sheet for Healthcare Providers: IncredibleEmployment.be  This test is not yet approved or cleared by the [place] FDA  and has been authorized for detection and/or diagnosis of SARS-CoV-2 by FDA under an Emergency Use Authorization (EUA). This EUA will remain in effect (meaning this test can be used) for the duration of the COVID-19 declaration under Section 564(b)(1) of the Act, 21 U.S.C. section 360bbb-3(b)(1), unless the authorization is terminated or revoked.  Performed at [facility], [address], [place], [place] [address]          Radiology Studies: No results found.      Scheduled Meds:  Chlorhexidine Gluconate Cloth  6 each Topical Daily   docusate sodium  100 mg Oral BID   enoxaparin (LOVENOX) injection  40 mg Subcutaneous I34V   folic acid  1 mg Oral Daily   lisinopril  40 mg Oral Daily   magnesium hydroxide  30 mL Oral Daily   mometasone-formoterol  2 puff Inhalation BID   pantoprazole  40 mg Oral Daily   sertraline  100 mg Oral Daily   thiamine  100 mg Oral Daily   cyanocobalamin  1,000 mcg Oral Daily   Continuous Infusions:  magnesium sulfate bolus IVPB       LOS: 4 days    Time spent: 25 minutes    Sharen Hones, MD [facility]   To contact the attending provider between 7A-7P or the covering provider during after hours 7P-7A, please log into the web site [email or web address] and access using universal [place] password for that web site. If you do not have the password, please call the hospital operator.  12/25/2020, 11:15 AM

## 2020-12-25 NOTE — TOC Progression Note (Signed)
Transition of Care [facility]) - Progression Note    Patient Details  Name: Shirley Evans MRN: [id number] Date of Birth: 1965-11-30  [facility]) CM/SW Contact  Izola Price, RN Phone Number: 12/25/2020, 3:41 PM  Clinical Narrative: 10/2: Disposition changed from SNF to [facility]. Attempted to reach patient by phone for assessment and agency choices with no answer. Attempted to reach mother's phone number given to Unit RN with request for update (permission given in Designated party notes) with no answer and a VM with call back number given. Mother was reportedly at bedside. Messaged RN attempts to reach mother. Patient has a Prevena wound vac still in place s/p Left Femoral closed neck fracture repair. HH PT; DME RW. Will attempt to reach out again. NMS for dc today. Simmie Davies RN CM          Expected Discharge Plan and Services                                                 Social Determinants of Health (SDOH) Interventions    Readmission Risk Interventions No flowsheet data found.

## 2020-12-25 NOTE — Progress Notes (Signed)
   Subjective: 3 Days Post-Op Procedure(s) (LRB): TOTAL HIP ARTHROPLASTY ANTERIOR APPROACH (Left) Patient reports pain as moderate.   Patient is well, and has had no acute complaints or problems Denies any CP, SOB, ABD pain. Several bowel movements yesterday. We will continue therapy today.  Patient making some progress with PT. Plan is to go Skilled nursing facility after hospital stay.  Objective: Vital signs in last 24 hours: Temp:  [97.9 F (36.6 C)-98.5 F (36.9 C)] 98.4 F (36.9 C) (10/02 0440) Pulse Rate:  [84-96] 84 (10/02 0440) Resp:  [15-20] 18 (10/02 0440) BP: (114-151)/(73-97) 151/97 (10/02 0440) SpO2:  [90 %-94 %] 94 % (10/02 0440)  Intake/Output from previous day: 10/01 0701 - 10/02 0700 In: 360 [P.O.:360] Out: -  Intake/Output this shift: No intake/output data recorded.  Recent Labs    12/22/20 0847 12/23/20 1014 12/24/20 0506 12/25/20 0545  HGB 15.8* 13.9 12.0 12.4   Recent Labs    12/24/20 0506 12/25/20 0545  WBC 12.6* 11.2*  RBC 3.38* 3.42*  HCT 33.3* 33.9*  PLT 182 212   Recent Labs    12/24/20 0506 12/25/20 0545  NA 127* 134*  K 3.9 4.1  CL 95* 97*  CO2 22 30  BUN 10 9  CREATININE 0.69 0.71  GLUCOSE 119* 89  CALCIUM 8.3* 9.3   No results for input(s): LABPT, INR in the last 72 hours.   EXAM General - Patient is Alert, Appropriate, and Oriented Extremity - Neurovascular intact Sensation intact distally Intact pulses distally Dorsiflexion/Plantar flexion intact Dressing - dressing C/D/I and no drainage, Praveena intact without drainage Motor Function - intact, moving foot and toes well on exam.   Past Medical History:  Diagnosis Date   Cancer ([place])    cervical   COPD (chronic obstructive pulmonary disease) (HCC)    GERD (gastroesophageal reflux disease)    History of Helicobacter pylori infection    Hypertension    Menopausal state    Osteopenia    Postmenopausal atrophic vaginitis     Assessment/Plan:   3 Days  Post-Op Procedure(s) (LRB): TOTAL HIP ARTHROPLASTY ANTERIOR APPROACH (Left) Principal Problem:   Fracture of femoral neck, left, closed (HCC) Active Problems:   Acute alcohol intoxication (HCC)   Hypomagnesemia   Hypokalemia   Hypertension   COPD with chronic bronchitis (HCC)   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   Weight as of this encounter: 77.1 kg. Advance diet Up with therapy Pain well controlled VSS Labs stable CM to assist with discharge to SNF  Lovenox 40 mg SQ daily x 14 days at discharge TED hose BLE x 6 weeks Please remove provena negative pressure dressing on 01/02/2021 and apply honey comb dressing. Keep dressing clean and dry at all times.  Follow up with [place] ortho in 2 weeks  DVT Prophylaxis - Lovenox, TED hose, and SCDs Weight-Bearing as tolerated to left leg   T. Rachelle Hora, PA-C [place] 12/25/2020, 8:02 AM

## 2020-12-26 DIAGNOSIS — S72002A Fracture of unspecified part of neck of left femur, initial encounter for closed fracture: Secondary | ICD-10-CM | POA: Diagnosis not present

## 2020-12-26 DIAGNOSIS — F10929 Alcohol use, unspecified with intoxication, unspecified: Secondary | ICD-10-CM | POA: Diagnosis not present

## 2020-12-26 DIAGNOSIS — F10920 Alcohol use, unspecified with intoxication, uncomplicated: Secondary | ICD-10-CM

## 2020-12-26 LAB — BASIC METABOLIC PANEL
Anion gap: 10 (ref 5–15)
BUN: 9 mg/dL (ref 6–20)
CO2: 28 mmol/L (ref 22–32)
Calcium: 9.1 mg/dL (ref 8.9–10.3)
Chloride: 97 mmol/L — ABNORMAL LOW (ref 98–111)
Creatinine, Ser: 0.62 mg/dL (ref 0.44–1.00)
GFR, Estimated: >60 mL/min (ref >60)
Glucose, Bld: 106 mg/dL — ABNORMAL HIGH (ref 70–99)
Potassium: 2.9 mmol/L — ABNORMAL LOW (ref 3.5–5.1)
Sodium: 135 mmol/L (ref 135–145)

## 2020-12-26 LAB — SURGICAL PATHOLOGY

## 2020-12-26 LAB — PHOSPHORUS: Phosphorus: 3.6 mg/dL (ref 2.5–4.6)

## 2020-12-26 LAB — MAGNESIUM: Magnesium: 1.6 mg/dL — ABNORMAL LOW (ref 1.7–2.4)

## 2020-12-26 NOTE — Progress Notes (Signed)
   Subjective: 4 Days Post-Op Procedure(s) (LRB): TOTAL HIP ARTHROPLASTY ANTERIOR APPROACH (Left) Patient reports pain as moderate.   Patient is well, and has had no acute complaints or problems Denies any CP, SOB, ABD pain. Several bowel movements yesterday. We will continue therapy today.  Patient making some progress with PT. Plan is to go Home after hospital stay.  Objective: Vital signs in last 24 hours: Temp:  [97.6 F (36.4 C)-98.7 F (37.1 C)] 97.6 F (36.4 C) (10/03 0752) Pulse Rate:  [73-97] 83 (10/03 0752) Resp:  [16-19] 16 (10/03 0752) BP: (95-176)/(75-99) 147/91 (10/03 0752) SpO2:  [91 %-96 %] 96 % (10/03 0752)  Intake/Output from previous day: 10/02 0701 - 10/03 0700 In: 480 [P.O.:480] Out: -  Intake/Output this shift: No intake/output data recorded.  Recent Labs    12/23/20 1014 12/24/20 0506 12/25/20 0545  HGB 13.9 12.0 12.4   Recent Labs    12/24/20 0506 12/25/20 0545  WBC 12.6* 11.2*  RBC 3.38* 3.42*  HCT 33.3* 33.9*  PLT 182 212   Recent Labs    12/25/20 0545 12/26/20 0412  NA 134* 135  K 4.1 2.9*  CL 97* 97*  CO2 30 28  BUN 9 9  CREATININE 0.71 0.62  GLUCOSE 89 106*  CALCIUM 9.3 9.1   No results for input(s): LABPT, INR in the last 72 hours.   EXAM General - Patient is Alert, Appropriate, and Oriented Extremity - Neurovascular intact Sensation intact distally Intact pulses distally Dorsiflexion/Plantar flexion intact Dressing - dressing C/D/I and no drainage, Praveena intact without drainage Motor Function - intact, moving foot and toes well on exam.   Past Medical History:  Diagnosis Date   Cancer ([place])    cervical   COPD (chronic obstructive pulmonary disease) (HCC)    GERD (gastroesophageal reflux disease)    History of Helicobacter pylori infection    Hypertension    Menopausal state    Osteopenia    Postmenopausal atrophic vaginitis     Assessment/Plan:   4 Days Post-Op Procedure(s) (LRB): TOTAL HIP  ARTHROPLASTY ANTERIOR APPROACH (Left) Principal Problem:   Closed fracture of left hip (HCC) Active Problems:   Alcoholic intoxication without complication (HCC)   Hypomagnesemia   Hypokalemia   Hypertension   COPD with chronic bronchitis (HCC)   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   Weight as of this encounter: 77.1 kg. Advance diet Up with therapy Pain well controlled VSS Labs stable CM to assist with discharge to SNF  Lovenox 40 mg SQ daily x 14 days at discharge TED hose BLE x 6 weeks Please remove provena negative pressure dressing on 01/02/2021 and apply honey comb dressing. Keep dressing clean and dry at all times.  Follow up with [facility] in 2 weeks  DVT Prophylaxis - Lovenox, TED hose, and SCDs Weight-Bearing as tolerated to left leg   T. Rachelle Hora, PA-C [place] 12/26/2020, 8:15 AM

## 2020-12-26 NOTE — Progress Notes (Signed)
Blood pressure (!) 123/91, pulse 96, temperature 98.2 F (36.8 C), resp. rate 15, height 5\' 4"  (1.626 m), weight 77.1 kg, SpO2 94 %. IV cath removed site c/d/I, switched pt to prevena wound vac, place ted hose. Honeycomb dressings and scripts in packet. Pt to make F/U appt. In 2 weeks. Discussed d/c packet with pt she verbalized her understanding pt transported with all belongings via W/C down to private car.

## 2020-12-26 NOTE — Progress Notes (Addendum)
Met with the patient in the room at the bedside, She stated that her mother will be staying with her helping her, She stated that she has a rolling walker and a 3 in 1 at home, she has transportation to the Doctor, she is able to afford her medication She needs HH PT, [facility] is 10-15 days out for [facility] PT TOC CM sent the referral to [facility] and to [facility], awaiting a response  [facility] has accepted the patient

## 2020-12-26 NOTE — Plan of Care (Signed)

## 2020-12-26 NOTE — Care Management Important Message (Signed)
Important Message  Patient Details  Name: Shirley Evans MRN: [id number] Date of Birth: [birth year]   Medicare Important Message Given:  Yes     Antavion Bartoszek, [place] 12/26/2020, 2:21 PM

## 2020-12-26 NOTE — Progress Notes (Addendum)
Physical Therapy Treatment Patient Details Name: Shirley Evans MRN: [id number] DOB: [birth year] Today's Date: 12/26/2020   History of Present Illness Pt is 55 y/o s/p L total hip with anterior approach after mechanical fall. Pt positive for ETOH and cocaine on admission.    PT Comments    OOB, to bathroom, completes lap with RW and stairs with ease.  Supervision/min guard provided but overall improved safety and no LOB or buckling noted.  Pt does state "Maybe I will go to rehab for a day or two" and stated she wanted to make it easier on her mom.  Pt has continued to progress with mobility and at this point is not appropriate for rehab given level of mobility and supervision level of care.  HHPT is appropriate at this time.   Recommendations for follow up therapy are one component of a multi-disciplinary discharge planning process, led by the attending physician.  Recommendations may be updated based on patient status, additional functional criteria and insurance authorization.  Follow Up Recommendations  Supervision for mobility/OOB;Home health PT     Equipment Recommendations  Rolling walker with 5" wheels    Recommendations for Other Services       Precautions / Restrictions Precautions Precautions: Fall;Anterior Hip Precaution Comments: no hip precautions Restrictions Weight Bearing Restrictions: Yes LLE Weight Bearing: Weight bearing as tolerated     Mobility  Bed Mobility Overal bed mobility: Modified Independent                  Transfers Overall transfer level: Needs assistance Equipment used: Rolling walker (2 wheeled) Transfers: Sit to/from Stand Sit to Stand: Supervision            Ambulation/Gait Ambulation/Gait assistance: Supervision Gait Distance (Feet): 200 Feet Assistive device: Rolling walker (2 wheeled) Gait Pattern/deviations: Step-through pattern;Antalgic;Decreased stance time - left;Decreased step length - right Gait velocity:  WNL       Stairs Stairs: Yes Stairs assistance: Supervision Stair Management: Alternating pattern;Two rails Number of Stairs: 4     Wheelchair Mobility    Modified Rankin (Stroke Patients Only)       Balance Overall balance assessment: Needs assistance Sitting-balance support: Feet supported         Standing balance-Leahy Scale: Fair                              Cognition Arousal/Alertness: Awake/alert Behavior During Therapy: WFL for tasks assessed/performed Overall Cognitive Status: Within Functional Limits for tasks assessed                                 General Comments: less impulsive today and follows directions well today.      Exercises Other Exercises Other Exercises: to commode to void    General Comments        Pertinent Vitals/Pain Pain Assessment: Faces Faces Pain Scale: Hurts a little bit Pain Location: LBP/ L hip pain Pain Descriptors / Indicators: Aching;Constant;Sore Pain Intervention(s): Limited activity within patient's tolerance;Monitored during session    Home Living                      Prior Function            PT Goals (current goals can now be found in the care plan section) Progress towards PT goals: Progressing toward goals    Frequency  BID      PT Plan Current plan remains appropriate    Co-evaluation              AM-PAC PT "6 Clicks" Mobility   Outcome Measure  Help needed turning from your back to your side while in a flat bed without using bedrails?: None Help needed moving from lying on your back to sitting on the side of a flat bed without using bedrails?: None Help needed moving to and from a bed to a chair (including a wheelchair)?: A Little Help needed standing up from a chair using your arms (e.g., wheelchair or bedside chair)?: A Little Help needed to walk in hospital room?: A Little Help needed climbing 3-5 steps with a railing? : A Little 6 Click  Score: 20    End of Session Equipment Utilized During Treatment: Gait belt Activity Tolerance: Patient tolerated treatment well Patient left: in bed;with call bell/phone within reach;with bed alarm set Nurse Communication: Mobility status PT Visit Diagnosis: Unsteadiness on feet (R26.81);History of falling (Z91.81);Muscle weakness (generalized) (M62.81);Difficulty in walking, not elsewhere classified (R26.2);Other abnormalities of gait and mobility (R26.89);Pain Pain - Right/Left: Left Pain - part of body: Hip     Time: 4320-0379 PT Time Calculation (min) (ACUTE ONLY): 10 min  Charges:  $Gait Training: 8-22 mins                    Chesley Noon, PTA 12/26/20, 12:53 PM

## 2020-12-28 ENCOUNTER — Encounter: Payer: Self-pay | Admitting: Orthopedic Surgery

## 2020-12-28 ENCOUNTER — Other Ambulatory Visit: Payer: Self-pay | Admitting: Orthopedic Surgery

## 2020-12-28 ENCOUNTER — Ambulatory Visit
Admission: RE | Admit: 2020-12-28 | Discharge: 2020-12-28 | Disposition: A | Discharge status: Home / Self Care | Payer: Medicare HMO | Source: Ambulatory Visit | Attending: Orthopedic Surgery | Admitting: Orthopedic Surgery

## 2020-12-28 ENCOUNTER — Other Ambulatory Visit: Payer: Self-pay

## 2020-12-28 DIAGNOSIS — M7989 Other specified soft tissue disorders: Secondary | ICD-10-CM | POA: Diagnosis present

## 2020-12-28 MED ORDER — HEMOSTATIC AGENTS (NO CHARGE) OPTIME
TOPICAL | Status: DC | PRN
  Administered 2020-12-22: 2 via TOPICAL
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
## 2021-02-02 ENCOUNTER — Other Ambulatory Visit: Payer: Self-pay | Admitting: Specialist

## 2021-02-02 ENCOUNTER — Other Ambulatory Visit (HOSPITAL_BASED_OUTPATIENT_CLINIC_OR_DEPARTMENT_OTHER): Payer: Self-pay | Admitting: Specialist

## 2021-02-02 DIAGNOSIS — R0609 Other forms of dyspnea: Secondary | ICD-10-CM

## 2021-02-21 ENCOUNTER — Ambulatory Visit: Payer: Medicare HMO

## 2021-03-01 ENCOUNTER — Other Ambulatory Visit: Payer: Self-pay

## 2021-03-01 ENCOUNTER — Ambulatory Visit
Admission: RE | Admit: 2021-03-01 | Discharge: 2021-03-01 | Disposition: A | Discharge status: Home / Self Care | Payer: Medicare HMO | Source: Ambulatory Visit | Attending: Specialist | Admitting: Specialist

## 2021-03-01 DIAGNOSIS — R0609 Other forms of dyspnea: Secondary | ICD-10-CM | POA: Diagnosis present

## 2021-04-05 ENCOUNTER — Other Ambulatory Visit: Payer: Self-pay | Admitting: Specialist

## 2021-04-05 DIAGNOSIS — R918 Other nonspecific abnormal finding of lung field: Secondary | ICD-10-CM

## 2021-05-10 ENCOUNTER — Other Ambulatory Visit: Payer: Self-pay | Admitting: Nurse Practitioner

## 2021-05-10 ENCOUNTER — Other Ambulatory Visit (HOSPITAL_COMMUNITY): Payer: Self-pay | Admitting: Nurse Practitioner

## 2021-05-10 DIAGNOSIS — K760 Fatty (change of) liver, not elsewhere classified: Secondary | ICD-10-CM

## 2021-05-10 DIAGNOSIS — R1013 Epigastric pain: Secondary | ICD-10-CM

## 2021-08-02 ENCOUNTER — Ambulatory Visit
Admission: RE | Admit: 2021-08-02 | Discharge: 2021-08-02 | Disposition: A | Discharge status: Home / Self Care | Payer: Medicare HMO | Source: Ambulatory Visit | Attending: Nurse Practitioner | Admitting: Nurse Practitioner

## 2021-08-02 ENCOUNTER — Ambulatory Visit: Admission: RE | Admit: 2021-08-02 | Payer: Medicare HMO | Source: Ambulatory Visit

## 2021-08-02 DIAGNOSIS — K760 Fatty (change of) liver, not elsewhere classified: Secondary | ICD-10-CM | POA: Diagnosis present

## 2021-08-02 DIAGNOSIS — R1013 Epigastric pain: Secondary | ICD-10-CM | POA: Insufficient documentation

## 2021-08-02 LAB — POCT I-STAT CREATININE: Creatinine, Ser: 0.9 mg/dL (ref 0.44–1.00)

## 2021-08-02 MED ORDER — IOHEXOL 300 MG/ML  SOLN
100.0000 mL | Freq: Once | INTRAMUSCULAR | Status: AC | PRN
  Administered 2021-08-02: 100 mL via INTRAVENOUS

## 2021-08-10 ENCOUNTER — Emergency Department: Payer: Medicare HMO

## 2021-08-10 ENCOUNTER — Other Ambulatory Visit: Payer: Self-pay

## 2021-08-10 ENCOUNTER — Observation Stay
Admission: EM | Admit: 2021-08-10 | Discharge: 2021-08-12 | Disposition: A | Discharge status: Home / Self Care | Payer: Medicare HMO | Attending: Internal Medicine | Admitting: Internal Medicine

## 2021-08-10 ENCOUNTER — Encounter: Payer: Self-pay | Admitting: Radiology

## 2021-08-10 DIAGNOSIS — K8020 Calculus of gallbladder without cholecystitis without obstruction: Secondary | ICD-10-CM | POA: Diagnosis not present

## 2021-08-10 DIAGNOSIS — E876 Hypokalemia: Secondary | ICD-10-CM | POA: Insufficient documentation

## 2021-08-10 DIAGNOSIS — N281 Cyst of kidney, acquired: Secondary | ICD-10-CM | POA: Insufficient documentation

## 2021-08-10 DIAGNOSIS — Z79899 Other long term (current) drug therapy: Secondary | ICD-10-CM | POA: Insufficient documentation

## 2021-08-10 DIAGNOSIS — I160 Hypertensive urgency: Secondary | ICD-10-CM | POA: Diagnosis not present

## 2021-08-10 DIAGNOSIS — K529 Noninfective gastroenteritis and colitis, unspecified: Secondary | ICD-10-CM | POA: Diagnosis not present

## 2021-08-10 DIAGNOSIS — R112 Nausea with vomiting, unspecified: Secondary | ICD-10-CM | POA: Diagnosis present

## 2021-08-10 DIAGNOSIS — F1290 Cannabis use, unspecified, uncomplicated: Secondary | ICD-10-CM | POA: Insufficient documentation

## 2021-08-10 DIAGNOSIS — R0600 Dyspnea, unspecified: Secondary | ICD-10-CM | POA: Insufficient documentation

## 2021-08-10 DIAGNOSIS — N200 Calculus of kidney: Secondary | ICD-10-CM | POA: Insufficient documentation

## 2021-08-10 DIAGNOSIS — Q631 Lobulated, fused and horseshoe kidney: Secondary | ICD-10-CM | POA: Insufficient documentation

## 2021-08-10 DIAGNOSIS — I10 Essential (primary) hypertension: Secondary | ICD-10-CM | POA: Diagnosis not present

## 2021-08-10 DIAGNOSIS — E860 Dehydration: Secondary | ICD-10-CM | POA: Insufficient documentation

## 2021-08-10 DIAGNOSIS — E785 Hyperlipidemia, unspecified: Secondary | ICD-10-CM | POA: Diagnosis present

## 2021-08-10 DIAGNOSIS — Z96642 Presence of left artificial hip joint: Secondary | ICD-10-CM | POA: Insufficient documentation

## 2021-08-10 DIAGNOSIS — D72829 Elevated white blood cell count, unspecified: Secondary | ICD-10-CM | POA: Insufficient documentation

## 2021-08-10 DIAGNOSIS — R1084 Generalized abdominal pain: Secondary | ICD-10-CM | POA: Insufficient documentation

## 2021-08-10 DIAGNOSIS — R1012 Left upper quadrant pain: Secondary | ICD-10-CM | POA: Insufficient documentation

## 2021-08-10 DIAGNOSIS — R7989 Other specified abnormal findings of blood chemistry: Secondary | ICD-10-CM | POA: Insufficient documentation

## 2021-08-10 DIAGNOSIS — F1010 Alcohol abuse, uncomplicated: Secondary | ICD-10-CM | POA: Insufficient documentation

## 2021-08-10 DIAGNOSIS — R9431 Abnormal electrocardiogram [ECG] [EKG]: Secondary | ICD-10-CM | POA: Diagnosis not present

## 2021-08-10 DIAGNOSIS — Z8541 Personal history of malignant neoplasm of cervix uteri: Secondary | ICD-10-CM | POA: Insufficient documentation

## 2021-08-10 DIAGNOSIS — I700 Atherosclerosis of aorta: Secondary | ICD-10-CM | POA: Insufficient documentation

## 2021-08-10 DIAGNOSIS — Z87891 Personal history of nicotine dependence: Secondary | ICD-10-CM | POA: Diagnosis not present

## 2021-08-10 DIAGNOSIS — R197 Diarrhea, unspecified: Secondary | ICD-10-CM

## 2021-08-10 DIAGNOSIS — R000 Tachycardia, unspecified: Secondary | ICD-10-CM | POA: Diagnosis not present

## 2021-08-10 DIAGNOSIS — R1013 Epigastric pain: Secondary | ICD-10-CM | POA: Insufficient documentation

## 2021-08-10 DIAGNOSIS — K219 Gastro-esophageal reflux disease without esophagitis: Secondary | ICD-10-CM | POA: Diagnosis not present

## 2021-08-10 DIAGNOSIS — J449 Chronic obstructive pulmonary disease, unspecified: Secondary | ICD-10-CM | POA: Diagnosis present

## 2021-08-10 DIAGNOSIS — J439 Emphysema, unspecified: Secondary | ICD-10-CM | POA: Diagnosis not present

## 2021-08-10 DIAGNOSIS — E663 Overweight: Secondary | ICD-10-CM | POA: Insufficient documentation

## 2021-08-10 DIAGNOSIS — Z6825 Body mass index (BMI) 25.0-25.9, adult: Secondary | ICD-10-CM | POA: Insufficient documentation

## 2021-08-10 DIAGNOSIS — E878 Other disorders of electrolyte and fluid balance, not elsewhere classified: Secondary | ICD-10-CM | POA: Insufficient documentation

## 2021-08-10 DIAGNOSIS — E871 Hypo-osmolality and hyponatremia: Secondary | ICD-10-CM | POA: Insufficient documentation

## 2021-08-10 DIAGNOSIS — K449 Diaphragmatic hernia without obstruction or gangrene: Secondary | ICD-10-CM | POA: Insufficient documentation

## 2021-08-10 DIAGNOSIS — Z7951 Long term (current) use of inhaled steroids: Secondary | ICD-10-CM | POA: Insufficient documentation

## 2021-08-10 DIAGNOSIS — Z8249 Family history of ischemic heart disease and other diseases of the circulatory system: Secondary | ICD-10-CM | POA: Insufficient documentation

## 2021-08-10 DIAGNOSIS — K760 Fatty (change of) liver, not elsewhere classified: Secondary | ICD-10-CM | POA: Insufficient documentation

## 2021-08-10 DIAGNOSIS — R160 Hepatomegaly, not elsewhere classified: Secondary | ICD-10-CM | POA: Insufficient documentation

## 2021-08-10 LAB — COMPREHENSIVE METABOLIC PANEL WITH GFR
ALT: 68 U/L — ABNORMAL HIGH (ref 0–44)
AST: 108 U/L — ABNORMAL HIGH (ref 15–41)
Albumin: 4.5 g/dL (ref 3.5–5.0)
Alkaline Phosphatase: 110 U/L (ref 38–126)
Anion gap: 20 — ABNORMAL HIGH (ref 5–15)
BUN: 16 mg/dL (ref 6–20)
CO2: 26 mmol/L (ref 22–32)
Calcium: 9 mg/dL (ref 8.9–10.3)
Chloride: 90 mmol/L — ABNORMAL LOW (ref 98–111)
Creatinine, Ser: 1.09 mg/dL — ABNORMAL HIGH (ref 0.44–1.00)
GFR, Estimated: 59 mL/min — ABNORMAL LOW
Glucose, Bld: 195 mg/dL — ABNORMAL HIGH (ref 70–99)
Potassium: 2.8 mmol/L — ABNORMAL LOW (ref 3.5–5.1)
Sodium: 136 mmol/L (ref 135–145)
Total Bilirubin: 4 mg/dL — ABNORMAL HIGH (ref 0.3–1.2)
Total Protein: 8.7 g/dL — ABNORMAL HIGH (ref 6.5–8.1)

## 2021-08-10 LAB — CBC
HCT: 49.6 % — ABNORMAL HIGH (ref 36.0–46.0)
Hemoglobin: 17.8 g/dL — ABNORMAL HIGH (ref 12.0–15.0)
MCH: 32.9 pg (ref 26.0–34.0)
MCHC: 35.9 g/dL (ref 30.0–36.0)
MCV: 91.7 fL (ref 80.0–100.0)
Platelets: 323 10*3/uL (ref 150–400)
RBC: 5.41 MIL/uL — ABNORMAL HIGH (ref 3.87–5.11)
RDW: 14.6 % (ref 11.5–15.5)
WBC: 12.4 10*3/uL — ABNORMAL HIGH (ref 4.0–10.5)
nRBC: 0 % (ref 0.0–0.2)

## 2021-08-10 LAB — URINALYSIS, ROUTINE W REFLEX MICROSCOPIC
Bilirubin Urine: NEGATIVE
Glucose, UA: NEGATIVE mg/dL
Hgb urine dipstick: NEGATIVE
Ketones, ur: NEGATIVE mg/dL
Leukocytes,Ua: NEGATIVE
Nitrite: NEGATIVE
Protein, ur: 100 mg/dL — AB
Specific Gravity, Urine: 1.021 (ref 1.005–1.030)
pH: 5 (ref 5.0–8.0)

## 2021-08-10 LAB — MAGNESIUM: Magnesium: 0.7 mg/dL — CL (ref 1.7–2.4)

## 2021-08-10 LAB — LIPASE, BLOOD: Lipase: 28 U/L (ref 11–51)

## 2021-08-10 LAB — PREGNANCY, URINE: Preg Test, Ur: NEGATIVE

## 2021-08-10 MED ORDER — MAGNESIUM SULFATE 2 GM/50ML IV SOLN
2.0000 g | Freq: Once | INTRAVENOUS | Status: AC
  Administered 2021-08-10: 2 g via INTRAVENOUS
  Filled 2021-08-10: qty 50

## 2021-08-10 MED ORDER — ALBUTEROL SULFATE (2.5 MG/3ML) 0.083% IN NEBU: 2.5000 mg | INHALATION_SOLUTION | RESPIRATORY_TRACT | Status: DC | PRN

## 2021-08-10 MED ORDER — LISINOPRIL 10 MG PO TABS
30.0000 mg | ORAL_TABLET | Freq: Every day | ORAL | Status: DC
  Administered 2021-08-11 – 2021-08-12 (×2): 30 mg via ORAL
  Filled 2021-08-10 (×2): qty 3

## 2021-08-10 MED ORDER — POTASSIUM CHLORIDE CRYS ER 20 MEQ PO TBCR
40.0000 meq | EXTENDED_RELEASE_TABLET | Freq: Once | ORAL | Status: AC
  Administered 2021-08-10: 40 meq via ORAL
  Filled 2021-08-10: qty 2

## 2021-08-10 MED ORDER — VITAMIN B-12 1000 MCG PO TABS
1000.0000 ug | ORAL_TABLET | Freq: Every day | ORAL | Status: DC
  Administered 2021-08-11 – 2021-08-12 (×2): 1000 ug via ORAL
  Filled 2021-08-10 (×2): qty 1

## 2021-08-10 MED ORDER — ENOXAPARIN SODIUM 40 MG/0.4ML IJ SOSY
40.0000 mg | PREFILLED_SYRINGE | INTRAMUSCULAR | Status: DC
  Administered 2021-08-10 – 2021-08-11 (×2): 40 mg via SUBCUTANEOUS
  Filled 2021-08-10 (×2): qty 0.4

## 2021-08-10 MED ORDER — ALBUTEROL SULFATE HFA 108 (90 BASE) MCG/ACT IN AERS: 2.0000 | INHALATION_SPRAY | RESPIRATORY_TRACT | Status: DC | PRN

## 2021-08-10 MED ORDER — DICYCLOMINE HCL 10 MG PO CAPS
10.0000 mg | ORAL_CAPSULE | Freq: Once | ORAL | Status: AC
  Administered 2021-08-10: 10 mg via ORAL
  Filled 2021-08-10: qty 1

## 2021-08-10 MED ORDER — FOLIC ACID 5 MG/ML IJ SOLN
1.0000 mg | Freq: Once | INTRAMUSCULAR | Status: AC
  Administered 2021-08-10: 1 mg via INTRAVENOUS
  Filled 2021-08-10: qty 0.2

## 2021-08-10 MED ORDER — ACETAMINOPHEN 325 MG PO TABS
650.0000 mg | ORAL_TABLET | Freq: Four times a day (QID) | ORAL | Status: DC | PRN
  Administered 2021-08-11 – 2021-08-12 (×2): 650 mg via ORAL
  Filled 2021-08-10 (×2): qty 2

## 2021-08-10 MED ORDER — ROSUVASTATIN CALCIUM 20 MG PO TABS: 40.0000 mg | ORAL_TABLET | Freq: Every day | ORAL | Status: DC

## 2021-08-10 MED ORDER — ONDANSETRON HCL 4 MG/2ML IJ SOLN: 4.0000 mg | Freq: Four times a day (QID) | INTRAMUSCULAR | Status: DC | PRN

## 2021-08-10 MED ORDER — LORAZEPAM 2 MG/ML IJ SOLN: 1.0000 mg | INTRAMUSCULAR | Status: DC | PRN

## 2021-08-10 MED ORDER — SODIUM CHLORIDE 0.9 % IV BOLUS
1000.0000 mL | Freq: Once | INTRAVENOUS | Status: AC
  Administered 2021-08-10: 1000 mL via INTRAVENOUS

## 2021-08-10 MED ORDER — THIAMINE HCL 100 MG/ML IJ SOLN: Freq: Once | INTRAVENOUS | Status: DC

## 2021-08-10 MED ORDER — BUPROPION HCL 75 MG PO TABS
75.0000 mg | ORAL_TABLET | Freq: Two times a day (BID) | ORAL | Status: DC
  Administered 2021-08-11 – 2021-08-12 (×3): 75 mg via ORAL
  Filled 2021-08-10 (×3): qty 1

## 2021-08-10 MED ORDER — DICYCLOMINE HCL 10 MG PO CAPS
10.0000 mg | ORAL_CAPSULE | Freq: Three times a day (TID) | ORAL | Status: DC
  Administered 2021-08-11 – 2021-08-12 (×7): 10 mg via ORAL
  Filled 2021-08-10 (×8): qty 1

## 2021-08-10 MED ORDER — POTASSIUM CHLORIDE 10 MEQ/100ML IV SOLN
10.0000 meq | Freq: Once | INTRAVENOUS | Status: AC
  Administered 2021-08-10: 10 meq via INTRAVENOUS
  Filled 2021-08-10: qty 100

## 2021-08-10 MED ORDER — ADULT MULTIVITAMIN W/MINERALS CH
1.0000 | ORAL_TABLET | Freq: Once | ORAL | Status: AC
  Administered 2021-08-11: 1 via ORAL
  Filled 2021-08-10: qty 1

## 2021-08-10 MED ORDER — METOCLOPRAMIDE HCL 5 MG/ML IJ SOLN: 10.0000 mg | Freq: Four times a day (QID) | INTRAMUSCULAR | Status: DC | PRN

## 2021-08-10 MED ORDER — PANTOPRAZOLE SODIUM 40 MG IV SOLR
40.0000 mg | Freq: Two times a day (BID) | INTRAVENOUS | Status: DC
Start: 2021-08-10 — End: 2021-08-12
  Administered 2021-08-10 – 2021-08-12 (×4): 40 mg via INTRAVENOUS
  Filled 2021-08-10 (×4): qty 10

## 2021-08-10 MED ORDER — THIAMINE HCL 100 MG/ML IJ SOLN
100.0000 mg | Freq: Once | INTRAMUSCULAR | Status: AC
  Administered 2021-08-10: 100 mg via INTRAVENOUS
  Filled 2021-08-10: qty 2

## 2021-08-10 MED ORDER — ONDANSETRON HCL 4 MG PO TABS: 4.0000 mg | ORAL_TABLET | Freq: Four times a day (QID) | ORAL | Status: DC | PRN

## 2021-08-10 MED ORDER — LOPERAMIDE HCL 2 MG PO CAPS: 2.0000 mg | ORAL_CAPSULE | ORAL | Status: DC | PRN

## 2021-08-10 MED ORDER — SERTRALINE HCL 50 MG PO TABS
100.0000 mg | ORAL_TABLET | Freq: Every day | ORAL | Status: DC
Start: 2021-08-11 — End: 2021-08-12
  Administered 2021-08-11 – 2021-08-12 (×2): 100 mg via ORAL
  Filled 2021-08-10 (×2): qty 2

## 2021-08-10 MED ORDER — IOHEXOL 300 MG/ML  SOLN
100.0000 mL | Freq: Once | INTRAMUSCULAR | Status: AC | PRN
  Administered 2021-08-10: 100 mL via INTRAVENOUS

## 2021-08-10 MED ORDER — POTASSIUM CHLORIDE IN NACL 20-0.9 MEQ/L-% IV SOLN
INTRAVENOUS | Status: DC
  Filled 2021-08-10 (×6): qty 1000

## 2021-08-10 MED ORDER — ACETAMINOPHEN 650 MG RE SUPP: 650.0000 mg | Freq: Four times a day (QID) | RECTAL | Status: DC | PRN

## 2021-08-10 MED ORDER — VITAMIN D 25 MCG (1000 UNIT) PO TABS
2000.0000 [IU] | ORAL_TABLET | Freq: Every day | ORAL | Status: DC
  Administered 2021-08-11 – 2021-08-12 (×2): 2000 [IU] via ORAL
  Filled 2021-08-10 (×2): qty 2

## 2021-08-10 MED ORDER — LABETALOL HCL 5 MG/ML IV SOLN: 20.0000 mg | INTRAVENOUS | Status: DC | PRN

## 2021-08-10 MED ORDER — MOMETASONE FURO-FORMOTEROL FUM 200-5 MCG/ACT IN AERO: 2.0000 | INHALATION_SPRAY | Freq: Two times a day (BID) | RESPIRATORY_TRACT | Status: DC

## 2021-08-10 MED ORDER — MAGNESIUM HYDROXIDE 400 MG/5ML PO SUSP: 30.0000 mL | Freq: Every day | ORAL | Status: DC | PRN

## 2021-08-10 MED ORDER — ONDANSETRON HCL 4 MG/2ML IJ SOLN
4.0000 mg | Freq: Once | INTRAMUSCULAR | Status: AC
  Administered 2021-08-10: 4 mg via INTRAVENOUS
  Filled 2021-08-10: qty 2

## 2021-08-10 NOTE — Assessment & Plan Note (Addendum)
Initially continued on lisinopril.  HCTZ held due to hypokalemia and hyponatremia.  With electrolytes better, will restart this evening.  Prescription given for lisinopril/HCTZ, which she has not filled in some time.

## 2021-08-10 NOTE — ED Notes (Signed)
Pt to CT

## 2021-08-10 NOTE — ED Notes (Signed)
Pt to ED for LUQ abdominal discomfort and vomiting and diarrhea worsened since 2 days ago but ongoing for atleast 2 months. Pt appears quite uncomfortable and is actively vomiting at this time. Family member states pt has not eaten anything since Monday. Pt states she has less pain and more nausea at this time. IV inserted for CT scan.

## 2021-08-10 NOTE — Assessment & Plan Note (Addendum)
-   This could be related to alcoholic hepatitis.  Supporting this is the trending down of LFTs today. - We will hold off statin therapy for now and follow LFTs with hydration.

## 2021-08-10 NOTE — Assessment & Plan Note (Addendum)
-   Magnesium will be aggressively replaced.  Follow-up magnesium this afternoon normalized n.

## 2021-08-10 NOTE — ED Provider Notes (Signed)
[facility] Provider Note  Patient Contact: 3:17 PM (approximate)   History   Emesis and Diarrhea   HPI  Shirley Evans is a 56 y.o. female who presents the emergency department complaining of diffuse abdominal pain, nausea, vomiting, diarrhea.  Patient states that she has a history of same that they cannot "figure out" why she has been having the symptoms for the last 2 years.  She actually had a CT scan for similar complaint roughly 10 days ago.  She states that the symptoms have been progressively worsening and drastically worsened 2 days ago.  She does have a history of hypomagnesemia, hypokalemia, GERD.  Patient states that she is supposed to take omeprazole but has not been able to keep the omeprazole down.  No hematochezia.  No bloody diarrhea.  Patient denies any urinary changes.  She states that she has a significantly decreased appetite and has not been able to drink much fluids due to the nausea and vomiting.     Physical Exam   Triage Vital Signs: ED Triage Vitals  Enc Vitals Group     BP 08/10/21 1438 (!) 161/131     Pulse Rate 08/10/21 1438 (!) 114     Resp 08/10/21 1438 16     Temp 08/10/21 1438 98.3 F (36.8 C)     Temp Source 08/10/21 1438 Oral     SpO2 08/10/21 1438 97 %     Weight 08/10/21 1439 140 lb (63.5 kg)     Height 08/10/21 1439 '5\' 4"'$  (1.626 m)     Head Circumference --      Peak Flow --      Pain Score 08/10/21 1439 10     Pain Loc --      Pain Edu? --      Excl. in [place]? --     Most recent vital signs: Vitals:   08/10/21 1930 08/10/21 2030  BP: (!) 149/109 (!) 170/105  Pulse: 95 89  Resp: 15 16  Temp: 98.3 F (36.8 C) 97.6 F (36.4 C)  SpO2: 96% 95%     General: Alert and in no acute distress.   Cardiovascular:  Good peripheral perfusion Respiratory: Normal respiratory effort without tachypnea or retractions. Lungs CTAB. Good air entry to the bases with no decreased or absent breath sounds. Gastrointestinal:  No external abdominal findings.  Bowel sounds 4 quadrants.  Soft to palpation all quadrants.  Diffuse tenderness throughout the abdomen with no point specific tenderness.. No guarding or rigidity. No palpable masses. No distention. No CVA tenderness. Musculoskeletal: Full range of motion to all extremities.  Neurologic:  No gross focal neurologic deficits are appreciated.  Skin:   No rash noted Other:   ED Results / Procedures / Treatments   Labs (all labs ordered are listed, but only abnormal results are displayed) Labs Reviewed  COMPREHENSIVE METABOLIC PANEL - Abnormal; Notable for the following components:      Result Value   Potassium 2.8 (*)    Chloride 90 (*)    Glucose, Bld 195 (*)    Creatinine, Ser 1.09 (*)    Total Protein 8.7 (*)    AST 108 (*)    ALT 68 (*)    Total Bilirubin 4.0 (*)    GFR, Estimated 59 (*)    Anion gap 20 (*)    All other components within normal limits  CBC - Abnormal; Notable for the following components:   WBC 12.4 (*)    RBC  5.41 (*)    Hemoglobin 17.8 (*)    HCT 49.6 (*)    All other components within normal limits  URINALYSIS, ROUTINE W REFLEX MICROSCOPIC - Abnormal; Notable for the following components:   Color, Urine AMBER (*)    APPearance CLOUDY (*)    Protein, ur 100 (*)    Bacteria, UA FEW (*)    All other components within normal limits  MAGNESIUM - Abnormal; Notable for the following components:   Magnesium 0.7 (*)    All other components within normal limits  LIPASE, BLOOD  PREGNANCY, URINE  CBC  CREATININE, SERUM  COMPREHENSIVE METABOLIC PANEL  CBC     EKG     RADIOLOGY  I personally viewed, evaluated, and interpreted these images as part of my medical decision making, as well as reviewing the written report by the radiologist.  ED Provider Interpretation: No acute findings on CT scan.  Patient has chronic hepatomegaly, hepatic steatosis.  Cholelithiasis is again visualized without evidence of cholecystitis.   Patient does have nephrolithiasis to the left kidney with no evidence of transition into ureter.  CT ABDOMEN PELVIS W CONTRAST  Result Date: 08/10/2021 CLINICAL DATA:  Left upper quadrant abdominal discomfort. Vomiting and diarrhea. Symptoms for 2 months, worsening over the past 2 days. EXAM: CT ABDOMEN AND PELVIS WITH CONTRAST TECHNIQUE: Multidetector CT imaging of the abdomen and pelvis was performed using the standard protocol following bolus administration of intravenous contrast. RADIATION DOSE REDUCTION: This exam was performed according to the departmental dose-optimization program which includes automated exposure control, adjustment of the mA and/or kV according to patient size and/or use of iterative reconstruction technique. CONTRAST:  169m OMNIPAQUE IOHEXOL 300 MG/ML  SOLN COMPARISON:  08/02/2021 FINDINGS: Lower chest: Emphysema. Normal heart size without pericardial or pleural effusion. Hepatobiliary: Hepatomegaly at 20.3 cm craniocaudal. Marked hepatic steatosis. Small gallstones without acute cholecystitis or biliary duct dilatation. Pancreas: Normal, without mass or ductal dilatation. Spleen: Normal in size, without focal abnormality. Adrenals/Urinary Tract: Normal adrenal glands. Mild bilateral renal cortical scarring. Too small to characterize lesions within the left kidney. Interpolar left renal 6 mm collecting system calculus. No hydronephrosis. Degraded evaluation of the pelvis, secondary to beam hardening artifact from left hip arthroplasty. Grossly normal urinary bladder. Stomach/Bowel: Tiny hiatal hernia. Apparent mild gastric antral wall thickening on 30/2 is favored to be due to underdistention. Normal colon and terminal ileum. Normal appendix, including on 62/2. Normal small bowel. Vascular/Lymphatic: Aortic atherosclerosis. Multiple renal arteries bilaterally. No abdominopelvic adenopathy. Reproductive: Normal uterus and adnexa. Other: No significant free fluid.  No free  intraperitoneal air. Musculoskeletal: Left hip arthroplasty. Advanced degenerative disc disease at the lumbosacral junction. IMPRESSION: 1. No acute process in the abdomen or pelvis. 2. Hepatomegaly and hepatic steatosis. 3. Cholelithiasis. 4. Left nephrolithiasis. 5. Tiny hiatal hernia. 6. Degraded evaluation of the pelvis, secondary to beam hardening artifact from left hip arthroplasty. 7. Aortic Atherosclerosis (ICD10-I70.0) and Emphysema (ICD10-J43.9). Electronically Signed   By: KAbigail MiyamotoM.D.   On: 08/10/2021 16:48    PROCEDURES:  Critical Care performed: No  Procedures   MEDICATIONS ORDERED IN ED: Medications  magnesium sulfate IVPB 2 g 50 mL (2 g Intravenous New Bag/Given 08/10/21 1944)  enoxaparin (LOVENOX) injection 40 mg (has no administration in time range)  0.9 % NaCl with KCl 20 mEq/ L  infusion (has no administration in time range)  acetaminophen (TYLENOL) tablet 650 mg (has no administration in time range)    Or  acetaminophen (TYLENOL) suppository 650 mg (  has no administration in time range)  magnesium hydroxide (MILK OF MAGNESIA) suspension 30 mL (has no administration in time range)  ondansetron (ZOFRAN) tablet 4 mg (has no administration in time range)    Or  ondansetron (ZOFRAN) injection 4 mg (has no administration in time range)  pantoprazole (PROTONIX) injection 40 mg (has no administration in time range)  metoCLOPramide (REGLAN) injection 10 mg (has no administration in time range)  labetalol (NORMODYNE) injection 20 mg (has no administration in time range)  lisinopril (ZESTRIL) tablet 30 mg (has no administration in time range)  buPROPion (WELLBUTRIN) tablet 75 mg (has no administration in time range)  sertraline (ZOLOFT) tablet 100 mg (has no administration in time range)  dicyclomine (BENTYL) capsule 10 mg (has no administration in time range)  vitamin B-12 (CYANOCOBALAMIN) tablet 1,000 mcg (has no administration in time range)  Cholecalciferol CAPS 2,000  Units (has no administration in time range)  albuterol (VENTOLIN HFA) 108 (90 Base) MCG/ACT inhaler 2 puff (has no administration in time range)  mometasone-formoterol (DULERA) 200-5 MCG/ACT inhaler 2 puff (has no administration in time range)  sodium chloride 0.9 % bolus 1,000 mL (0 mLs Intravenous Stopped 08/10/21 1816)  potassium chloride 10 mEq in 100 mL IVPB (0 mEq Intravenous Stopped 08/10/21 1816)  potassium chloride SA (KLOR-CON M) CR tablet 40 mEq (40 mEq Oral Given 08/10/21 1708)  ondansetron (ZOFRAN) injection 4 mg (4 mg Intravenous Given 08/10/21 1632)  iohexol (OMNIPAQUE) 300 MG/ML solution 100 mL (100 mLs Intravenous Contrast Given 08/10/21 1635)  magnesium sulfate IVPB 2 g 50 mL (0 g Intravenous Stopped 08/10/21 1739)  dicyclomine (BENTYL) capsule 10 mg (10 mg Oral Given 08/10/21 1944)  sodium chloride 0.9 % bolus 1,000 mL (1,000 mLs Intravenous New Bag/Given 08/10/21 1944)     IMPRESSION / MDM / ASSESSMENT AND PLAN / ED COURSE  I reviewed the triage vital signs and the nursing notes.                              Differential diagnosis includes, but is not limited to, chronic nausea, vomiting, diarrhea, cholecystitis, appendicitis, colitis, bowel obstruction, electrolyte abnormalities   Patient's diagnosis is consistent with nausea, vomiting, diarrhea, hypomagnesemia, hypokalemia.  Patient presents to the ED with worsening of an acute problem.  Patient states that she has been having ongoing nausea, vomiting, diarrhea times a year.  She is been seen by GI, states that she has had colonoscopies without any acute finding.  Patient had similar symptoms 2 weeks ago, had a CT 10 days ago which revealed no acute findings.  Patient does have hepatic steatosis, she has elevated LFTs and T. bili.  Looking back through the patient's chart she has had previous hepatitis panel within the last 2 years, as well as labs which revealed similar elevation of LFTs and elevated T. bili.  More concern,  patient has hypokalemia with 2.8, as well as hypomagnesemia at 0.7.  At this point with ongoing symptoms, I do feel that patient will require admission.  We have given IV, oral potassium as well as IV magnesium..  Patient CT is reassuring today with no acute changes from her CT 10 days ago.  No acute process identified.  Chronic hepatic steatosis, left cholelithiasis, nephrolithiasis to the left kidney was visualized as well.  At this time I will reach out to the hospitalist service for admission for her chronic nausea vomiting and diarrhea with dehydration and electrolyte abnormalities.  Hospitalist service agrees to accept the patient at this time.        FINAL CLINICAL IMPRESSION(S) / ED DIAGNOSES   Final diagnoses:  Nausea vomiting and diarrhea  Hypokalemia  Hypomagnesemia     Rx / DC Orders   ED Discharge Orders     None        Note:  This document was prepared using Dragon voice recognition software and may include unintentional dictation errors.   Brynda Peon 08/10/21 2043    Vanessa [place], MD 08/11/21 1135

## 2021-08-10 NOTE — Assessment & Plan Note (Addendum)
Could be alcoholic gastritis versus esophagitis.  Patient seems to be tolerating clear liquids.  Continue PPI.  As needed antiemetics.  CT scan on admission unremarkable.  No evidence of obstruction.  Tolerated soft bland diet.  Advance to more solid food.  Treated with Imodium for diarrhea which greatly improved her symptoms.  Prescription given for Imodium.

## 2021-08-10 NOTE — Assessment & Plan Note (Addendum)
-   We will continue albuterol and Wixela/equivalent inhalers.  Stable, no shortness of breath.

## 2021-08-10 NOTE — Assessment & Plan Note (Signed)
-  She will be placed on IV PPI therapy as mentioned above.

## 2021-08-10 NOTE — Assessment & Plan Note (Signed)
-   She will be hydrated as mentioned above. - PPI therapy will be provided. - We will place on as needed Imodium.

## 2021-08-10 NOTE — Assessment & Plan Note (Addendum)
-   Secondary to persistent vomiting.  Potassium will be aggressively replaced.  Follow-up potassium this afternoon normalized

## 2021-08-10 NOTE — ED Notes (Signed)
Messaged provider re: pt may not be able to tolerate PO potassium at this time but has IV potassium infusing.

## 2021-08-10 NOTE — ED Notes (Signed)
Computer in room not turning on. Unable to scan meds.

## 2021-08-10 NOTE — Assessment & Plan Note (Addendum)
-   She will be placed on banana bag daily. - We will utilize as needed IV Ativan for alcohol withdrawal.  States that last alcohol drink was a week ago.  Alcohol level checked on hospital day 2 was normal.

## 2021-08-10 NOTE — Assessment & Plan Note (Signed)
-   We will hold off statin therapy given elevated LFTs 

## 2021-08-10 NOTE — ED Triage Notes (Signed)
Pt here with emesis and diarrhea that started 2 days ago. Pt states she had bloodwork and scans done 2 weeks ago for the same symptoms. Pt states LUQ abd pain as well.

## 2021-08-10 NOTE — H&P (Addendum)
Shirley Evans   PATIENT NAME: Shirley Evans    MR#:  [id number]  DATE OF BIRTH:  07-01-1965  DATE OF ADMISSION:  08/10/2021  PRIMARY CARE PHYSICIAN: Romualdo Bolk, FNP   Patient is coming from: Home  REQUESTING/REFERRING PHYSICIAN: Cuthriell, Charline Bills, PA-C   CHIEF COMPLAINT:   Chief Complaint  Patient presents with   Emesis   Diarrhea    HISTORY OF PRESENT ILLNESS:  Shirley Evans is a 56 y.o. Caucasian female with medical history significant for COPD, GERD, hypertension, alcohol abuse and chronic nausea and vomiting, who presented to the ER with acute onset of diffuse abdominal pain mainly in the epigastric area and left upper quadrant with associated intractable nausea and vomiting as well as diarrhea for the last couple of days.  She stated she has been having intermittent similar symptoms over the last couple of years.  This is currently being investigated by her gastroenterologist at [facility].  10 days ago she had an abdominal pelvic CT scan with no clear etiology.  She is expected to have an EGD scheduled in the near future.  She was supposed to take PPI therapy with omeprazole daily but has not been able to keep it down.  No bilious vomitus or hematemesis.  No melena or bright red bleeding per rectum. No dysuria, oliguria or hematuria or flank pain.  She denies any chest pain or palpitations.  She has been of significant diminished appetite with associated nausea and vomiting and has not been able to keep fluids.  She admits to mild dyspnea without cough or wheezing.  Her last alcoholic drink was 1 week.  She usually drinks about 6 beers on a regular basis.  She also admits to using CBD Gummies.  ED Course: When she came to the ER, BP was 161/131 and later 175/115 with respirate of 16 and later 22, heart rate of 114 initially and tension 8.3 with pulse symmetry 97% on room air.  Labs revealed hypokalemia of 2.8 and hypochloremia of 90 hypomagnesemia 0.7 blood glucose of 195  with a creatinine of 1.09 compared to 0.9 on 5/10 anion gap of 20.  AST was 108 and ALT 68 with total protein of 8.7 and albumin 4.5 and total bili of 4.  Of note her AST was 231 and ALT 185 with total bili 1.7 on 12/22/2020.  CBC showed leukocytosis 12.4 with hemoconcentration.  UA came back with 100 protein and was otherwise unremarkable. EKG as reviewed by me : EKG showed a sinus tachycardia with rate 112 with lateral inferior ST segment depression. Imaging: Abdominal pelvic CT scan revealed no acute process in the abdomen and pelvis.  Showed hepatomegaly and hepatic steatosis, cholelithiasis, left nephrolithiasis, tiny hiatal hernia and aortic atherosclerosis as well as emphysema.  Pelvic evaluation was slightly degraded due to beam hardening artifact from her left hip arthroplasty.  The patient was given 10 mg of p.o. Bentyl, 4 g of IV magnesium sulfate, 10 mill equivalent of IV potassium chloride and 40 mill equivalent p.o. KCl, 2 L bolus of IV normal saline and 4 mg of IV Zofran.  She will be admitted to an observation medical telemetry bed for further evaluation and management. PAST MEDICAL HISTORY:   Past Medical History:  Diagnosis Date   Cancer ([place])    cervical   COPD (chronic obstructive pulmonary disease) (HCC)    GERD (gastroesophageal reflux disease)    History of Helicobacter pylori infection    Hypertension  Menopausal state    Osteopenia    Postmenopausal atrophic vaginitis     PAST SURGICAL HISTORY:   Past Surgical History:  Procedure Laterality Date   BREAST BIOPSY Bilateral    neg   TOTAL HIP ARTHROPLASTY Left 12/22/2020   Procedure: TOTAL HIP ARTHROPLASTY ANTERIOR APPROACH;  Surgeon: Hessie Knows, MD;  Location: [facility] ORS;  Service: Orthopedics;  Laterality: Left;    SOCIAL HISTORY:   Social History   Tobacco Use   Smoking status: Former    Packs/day: 1.00    Years: 30.00    Pack years: 30.00    Types: Cigarettes   Smokeless tobacco: Never  Substance  Use Topics   Alcohol use: Yes    Alcohol/week: 0.0 standard drinks    FAMILY HISTORY:   Family History  Problem Relation Age of Onset   Diabetes Mother    Hypertension Mother    Breast cancer Paternal Aunt 66   Hypertension Father     DRUG ALLERGIES:   Allergies  Allergen Reactions   Demerol [Meperidine] Nausea Only and Nausea And Vomiting   Codeine Nausea Only    REVIEW OF SYSTEMS:   ROS As per history of present illness. All pertinent systems were reviewed above. Constitutional, HEENT, cardiovascular, respiratory, GI, GU, musculoskeletal, neuro, psychiatric, endocrine, integumentary and hematologic systems were reviewed and are otherwise negative/unremarkable except for positive findings mentioned above in the HPI.   MEDICATIONS AT HOME:   Prior to Admission medications   Medication Sig Start Date End Date Taking? Authorizing Provider  albuterol (PROVENTIL) (2.5 MG/3ML) 0.083% nebulizer solution Inhale into the lungs. 07/09/19 08/10/21  [provider]  albuterol (VENTOLIN HFA) 108 (90 Base) MCG/ACT inhaler Inhale into the lungs. 07/09/19   [provider]  Aspirin-Acetaminophen 500-325 MG PACK Take by mouth.    [provider]  buPROPion [facility]) 75 MG tablet Take by mouth. 06/19/19 08/10/21  [provider]  carisoprodol-aspirin (SOMA COMPOUND) 200-325 MG per tablet Take 1 tablet by mouth.    [provider]  Cholecalciferol 50 MCG (2000 UT) CAPS Take by mouth.    [provider]  cyanocobalamin 1000 MCG tablet Take by mouth.    [provider]  cyclobenzaprine (FLEXERIL) 10 MG tablet Take by mouth. 06/19/19   [provider]  dicyclomine (BENTYL) 10 MG capsule Take by mouth. 06/19/19   [provider]  enoxaparin (LOVENOX) 40 MG/0.4ML injection Inject 0.4 mLs (40 mg total) into the skin daily for 14 days. 12/25/20 01/08/21  Duanne Guess, PA-C  gabapentin (NEURONTIN) 300 MG capsule Take 1  capsule by mouth in the morning and at bedtime. 11/08/20   [provider]  lidocaine (LIDODERM) 5 % Place onto the skin. 04/01/19   [provider]  lisinopril (ZESTRIL) 30 MG tablet Take 30 mg by mouth daily. 03/24/21   [provider]  lisinopril-hydrochlorothiazide (ZESTORETIC) 20-12.5 MG tablet Take by mouth. 06/19/19   [provider]  naproxen sodium (ALEVE) 220 MG tablet Take by mouth.    [provider]  omeprazole (PRILOSEC) 20 MG capsule Take 20 mg by mouth daily. 06/19/19 08/21/19  [provider]  oxyCODONE-acetaminophen (PERCOCET) 7.5-325 MG tablet Take 1-2 tablets by mouth every 4 (four) hours as needed for moderate pain or severe pain. 12/25/20   Duanne Guess, PA-C  rosuvastatin (CRESTOR) 40 MG tablet Take 40 mg by mouth daily. 06/19/19 06/18/20  [provider]  sertraline (ZOLOFT) 100 MG tablet Take 100 mg by mouth daily.  06/19/19   [provider]  valACYclovir (VALTREX) 1000 MG tablet Take 1,000 mg by mouth daily. 11/08/20   [provider]  Grant Ruts INHUB 250-50 MCG/ACT AEPB Inhale 1 puff into the lungs 2 (two) times daily. 11/08/20   [provider]      VITAL SIGNS:  Blood pressure (!) 170/105, pulse 89, temperature 97.6 F (36.4 C), temperature source Oral, resp. rate 16, height '5\' 4"'$  (1.626 m), weight 63.5 kg, SpO2 95 %.  PHYSICAL EXAMINATION:  Physical Exam  GENERAL:  56 y.o.-year-old Caucasian patient lying in the bed with no acute distress.  EYES: Pupils equal, round, reactive to light and accommodation. No scleral icterus. Extraocular muscles intact.  HEENT: Head atraumatic, normocephalic. Oropharynx and nasopharynx clear.  NECK:  Supple, no jugular venous distention. No thyroid enlargement, no tenderness.  LUNGS: Normal breath sounds bilaterally, no wheezing, rales,rhonchi or crepitation. No use of accessory muscles of respiration.  CARDIOVASCULAR: Regular rate and rhythm, S1, S2  normal. No murmurs, rubs, or gallops.  ABDOMEN: Soft, nondistended, with generalized tenderness mainly in the epigastric and left upper quadrant without rebound tenderness guarding or rigidity.  Bowel sounds present. No organomegaly or mass.  EXTREMITIES: No pedal edema, cyanosis, or clubbing.  NEUROLOGIC: Cranial nerves II through XII are intact. Muscle strength 5/5 in all extremities. Sensation intact. Gait not checked.  PSYCHIATRIC: The patient is alert and oriented x 3.  Normal affect and good eye contact. SKIN: No obvious rash, lesion, or ulcer.   LABORATORY PANEL:   CBC Recent Labs  Lab 08/10/21 1440  WBC 12.4*  HGB 17.8*  HCT 49.6*  PLT 323   ------------------------------------------------------------------------------------------------------------------  Chemistries  Recent Labs  Lab 08/10/21 1440  NA 136  K 2.8*  CL 90*  CO2 26  GLUCOSE 195*  BUN 16  CREATININE 1.09*  CALCIUM 9.0  MG 0.7*  AST 108*  ALT 68*  ALKPHOS 110  BILITOT 4.0*   ------------------------------------------------------------------------------------------------------------------  Cardiac Enzymes No results for input(s): TROPONINI in the last 168 hours. ------------------------------------------------------------------------------------------------------------------  RADIOLOGY:  CT ABDOMEN PELVIS W CONTRAST  Result Date: 08/10/2021 CLINICAL DATA:  Left upper quadrant abdominal discomfort. Vomiting and diarrhea. Symptoms for 2 months, worsening over the past 2 days. EXAM: CT ABDOMEN AND PELVIS WITH CONTRAST TECHNIQUE: Multidetector CT imaging of the abdomen and pelvis was performed using the standard protocol following bolus administration of intravenous contrast. RADIATION DOSE REDUCTION: This exam was performed according to the departmental dose-optimization program which includes automated exposure control, adjustment of the mA and/or kV according to patient size and/or use of iterative  reconstruction technique. CONTRAST:  175m OMNIPAQUE IOHEXOL 300 MG/ML  SOLN COMPARISON:  08/02/2021 FINDINGS: Lower chest: Emphysema. Normal heart size without pericardial or pleural effusion. Hepatobiliary: Hepatomegaly at 20.3 cm craniocaudal. Marked hepatic steatosis. Small gallstones without acute cholecystitis or biliary duct dilatation. Pancreas: Normal, without mass or ductal dilatation. Spleen: Normal in size, without focal abnormality. Adrenals/Urinary Tract: Normal adrenal glands. Mild bilateral renal cortical scarring. Too small to characterize lesions within the left kidney. Interpolar left renal 6 mm collecting system calculus. No hydronephrosis. Degraded evaluation of the pelvis, secondary to beam hardening artifact from left hip arthroplasty. Grossly normal urinary bladder. Stomach/Bowel: Tiny hiatal hernia. Apparent mild gastric antral wall thickening on 30/2 is favored to be due to underdistention. Normal colon and terminal ileum. Normal appendix, including on 62/2. Normal small bowel. Vascular/Lymphatic: Aortic atherosclerosis. Multiple renal arteries bilaterally. No abdominopelvic adenopathy. Reproductive: Normal uterus and adnexa. Other: No significant free fluid.  No free intraperitoneal air. Musculoskeletal: Left hip arthroplasty. Advanced degenerative disc disease at the lumbosacral junction. IMPRESSION: 1. No acute process in the abdomen or pelvis. 2. Hepatomegaly and hepatic steatosis. 3. Cholelithiasis. 4. Left nephrolithiasis. 5. Tiny hiatal hernia. 6. Degraded evaluation of the pelvis, secondary to beam hardening artifact from left hip arthroplasty. 7. Aortic Atherosclerosis (ICD10-I70.0) and Emphysema (ICD10-J43.9). Electronically Signed   By: Abigail Miyamoto M.D.   On: 08/10/2021 16:48      IMPRESSION AND PLAN:  Assessment and Plan: * Intractable nausea and vomiting - The patient will be admitted to a medical telemetry observation bed. - Differential diagnoses include alcoholic  gastritis, viral enteritis and marijuana related gastritis. - We will continue hydration with IV normal saline with added potassium chloride. - We will place on as needed antiemetics. - We will place on IV PPI therapy with possibility of underlying gastritis.  Hypertensive urgency - The patient be resumed on her lisinopril I will hold off HCTZ given significant hypokalemia and hypomagnesemia. - We will place her on as needed IV labetalol. - Pain management will be provided.  Acute gastroenteritis - She will be hydrated as mentioned above. - PPI therapy will be provided. - We will place on as needed Imodium.  Hypokalemia - Potassium will be aggressively replaced.  Hypomagnesemia - Magnesium will be aggressively replaced  Elevated LFTs - This could be related to alcoholic hepatitis. - We will hold off statin therapy for now and follow LFTs with hydration.  Alcohol abuse - She will be placed on banana bag daily. - We will utilize as needed IV Ativan for alcohol withdrawal.  GERD (gastroesophageal reflux disease) -She will be placed on IV PPI therapy as mentioned above.  Dyslipidemia - We will hold off statin therapy given elevated LFTs.  COPD with chronic bronchitis ([place]) - We will continue albuterol and Wixela/equivalent inhalers.   DVT prophylaxis: Lovenox.  Advanced Care Planning:  Code Status: full code.  Family Communication:  The plan of care was discussed in details with the patient (and family). I answered all questions. The patient agreed to proceed with the above mentioned plan. Further management will depend upon hospital course. Disposition Plan: Back to previous home environment Consults called: none.  All the records are reviewed and case discussed with ED provider.  Status is: Observation  I certify that at the time of admission, it is my clinical judgment that the patient will require  hospital care extending less than 2 midnights.                             Dispo: The patient is from: Home              Anticipated d/c is to: Home              Patient currently is not medically stable to d/c.              Difficult to place patient: No  Christel Mormon M.D on 08/10/2021 at 10:12 PM  [facility]   From 7 PM-7 AM, contact night-coverage [email or web address]  CC: Primary care physician; Romualdo Bolk, FNP

## 2021-08-11 ENCOUNTER — Encounter: Payer: Self-pay | Admitting: Family Medicine

## 2021-08-11 DIAGNOSIS — E876 Hypokalemia: Secondary | ICD-10-CM | POA: Diagnosis not present

## 2021-08-11 DIAGNOSIS — I160 Hypertensive urgency: Secondary | ICD-10-CM | POA: Diagnosis not present

## 2021-08-11 DIAGNOSIS — E663 Overweight: Secondary | ICD-10-CM | POA: Diagnosis present

## 2021-08-11 DIAGNOSIS — R112 Nausea with vomiting, unspecified: Secondary | ICD-10-CM | POA: Diagnosis not present

## 2021-08-11 LAB — BASIC METABOLIC PANEL
Anion gap: 9 (ref 5–15)
BUN: 15 mg/dL (ref 6–20)
CO2: 24 mmol/L (ref 22–32)
Calcium: 7.8 mg/dL — ABNORMAL LOW (ref 8.9–10.3)
Chloride: 101 mmol/L (ref 98–111)
Creatinine, Ser: 0.77 mg/dL (ref 0.44–1.00)
GFR, Estimated: >60 mL/min (ref >60)
Glucose, Bld: 158 mg/dL — ABNORMAL HIGH (ref 70–99)
Potassium: 3.6 mmol/L (ref 3.5–5.1)
Sodium: 134 mmol/L — ABNORMAL LOW (ref 135–145)

## 2021-08-11 LAB — GASTROINTESTINAL PANEL BY PCR, STOOL (REPLACES STOOL CULTURE)

## 2021-08-11 LAB — CBC
HCT: 41.8 % (ref 36.0–46.0)
Hemoglobin: 14.4 g/dL (ref 12.0–15.0)
MCH: 32.7 pg (ref 26.0–34.0)
MCHC: 34.4 g/dL (ref 30.0–36.0)
MCV: 95 fL (ref 80.0–100.0)
Platelets: 183 10*3/uL (ref 150–400)
RBC: 4.4 MIL/uL (ref 3.87–5.11)
RDW: 14.8 % (ref 11.5–15.5)
WBC: 9 10*3/uL (ref 4.0–10.5)
nRBC: 0 % (ref 0.0–0.2)

## 2021-08-11 LAB — COMPREHENSIVE METABOLIC PANEL
ALT: 50 U/L — ABNORMAL HIGH (ref 0–44)
AST: 88 U/L — ABNORMAL HIGH (ref 15–41)
Albumin: 3.3 g/dL — ABNORMAL LOW (ref 3.5–5.0)
Alkaline Phosphatase: 84 U/L (ref 38–126)
Anion gap: 13 (ref 5–15)
BUN: 15 mg/dL (ref 6–20)
CO2: 28 mmol/L (ref 22–32)
Calcium: 7.6 mg/dL — ABNORMAL LOW (ref 8.9–10.3)
Chloride: 96 mmol/L — ABNORMAL LOW (ref 98–111)
Creatinine, Ser: 0.67 mg/dL (ref 0.44–1.00)
GFR, Estimated: >60 mL/min (ref >60)
Glucose, Bld: 92 mg/dL (ref 70–99)
Potassium: 3 mmol/L — ABNORMAL LOW (ref 3.5–5.1)
Sodium: 137 mmol/L (ref 135–145)
Total Bilirubin: 3 mg/dL — ABNORMAL HIGH (ref 0.3–1.2)
Total Protein: 6.2 g/dL — ABNORMAL LOW (ref 6.5–8.1)

## 2021-08-11 LAB — MAGNESIUM
Magnesium: 1.5 mg/dL — ABNORMAL LOW (ref 1.7–2.4)
Magnesium: 1.9 mg/dL (ref 1.7–2.4)

## 2021-08-11 LAB — ETHANOL: Alcohol, Ethyl (B): <10 mg/dL (ref <10)

## 2021-08-11 LAB — C DIFFICILE QUICK SCREEN W PCR REFLEX
C Diff antigen: NEGATIVE
C Diff interpretation: NOT DETECTED
C Diff toxin: NEGATIVE

## 2021-08-11 LAB — URINE DRUG SCREEN, QUALITATIVE (ARMC ONLY)
Amphetamines, Ur Screen: NOT DETECTED
Barbiturates, Ur Screen: NOT DETECTED
Benzodiazepine, Ur Scrn: NOT DETECTED
Cannabinoid 50 Ng, Ur ~~LOC~~: POSITIVE — AB
Cocaine Metabolite,Ur ~~LOC~~: NOT DETECTED
MDMA (Ecstasy)Ur Screen: NOT DETECTED
Methadone Scn, Ur: NOT DETECTED
Opiate, Ur Screen: NOT DETECTED
Phencyclidine (PCP) Ur S: NOT DETECTED
Tricyclic, Ur Screen: NOT DETECTED

## 2021-08-11 LAB — BRAIN NATRIURETIC PEPTIDE: B Natriuretic Peptide: 80.7 pg/mL (ref 0.0–100.0)

## 2021-08-11 MED ORDER — LOPERAMIDE HCL 2 MG PO CAPS
2.0000 mg | ORAL_CAPSULE | Freq: Three times a day (TID) | ORAL | Status: DC
Start: 2021-08-11 — End: 2021-08-12
  Administered 2021-08-11 – 2021-08-12 (×3): 2 mg via ORAL
  Filled 2021-08-11 (×3): qty 1

## 2021-08-11 MED ORDER — TRAZODONE HCL 50 MG PO TABS
25.0000 mg | ORAL_TABLET | Freq: Every evening | ORAL | Status: DC | PRN
  Administered 2021-08-11: 25 mg via ORAL
  Filled 2021-08-11: qty 1

## 2021-08-11 MED ORDER — MAGNESIUM SULFATE 2 GM/50ML IV SOLN
2.0000 g | Freq: Once | INTRAVENOUS | Status: AC
  Administered 2021-08-11: 2 g via INTRAVENOUS
  Filled 2021-08-11: qty 50

## 2021-08-11 MED ORDER — POTASSIUM CHLORIDE CRYS ER 20 MEQ PO TBCR
40.0000 meq | EXTENDED_RELEASE_TABLET | ORAL | Status: AC
  Administered 2021-08-11 (×2): 40 meq via ORAL
  Filled 2021-08-11 (×2): qty 2

## 2021-08-11 NOTE — Assessment & Plan Note (Signed)
Meets criteria for BMI greater than 25 

## 2021-08-11 NOTE — TOC Initial Note (Signed)
Transition of Care [facility]) - Initial/Assessment Note    Patient Details  Name: DECARLA SIEMEN MRN: [id number] Date of Birth: September 04, 1965  Transition of [facility]) CM/SW Contact:    Shelbie Hutching, RN Phone Number: 08/11/2021, 1:31 PM  Clinical Narrative:                  Transition of Care [facility]) Screening Note   Patient Details  Name: THEODORE RAHRIG Date of Birth: 11-04-1965   Transition of Care [facility]) CM/SW Contact:    Shelbie Hutching, RN Phone Number: 08/11/2021, 1:31 PM    Transition of Care Department [facility]) has reviewed patient and no TOC needs have been identified at this time. We will continue to monitor patient advancement through interdisciplinary progression rounds. If new patient transition needs arise, please place a TOC consult.          Patient Goals and CMS Choice        Expected Discharge Plan and Services                                                Prior Living Arrangements/Services                       Activities of Daily Living Home Assistive Devices/Equipment: None ADL Screening (condition at time of admission) Patient's cognitive ability adequate to safely complete daily activities?: Yes Is the patient deaf or have difficulty hearing?: No Does the patient have difficulty seeing, even when wearing glasses/contacts?: No Does the patient have difficulty concentrating, remembering, or making decisions?: No Patient able to express need for assistance with ADLs?: No Does the patient have difficulty dressing or bathing?: No Independently performs ADLs?: Yes (appropriate for developmental age) Does the patient have difficulty walking or climbing stairs?: No Weakness of Legs: None Weakness of Arms/Hands: None  Permission Sought/Granted                  Emotional Assessment              Admission diagnosis:  Hypokalemia [E87.6] Hypomagnesemia [E83.42] Nausea vomiting and diarrhea [R11.2,  R19.7] Intractable nausea and vomiting [R11.2] Patient Active Problem List   Diagnosis Date Noted   Overweight (BMI 25.0-29.9) 08/11/2021   Intractable nausea and vomiting 08/10/2021   Hypertensive urgency 08/10/2021   Dyslipidemia 08/10/2021   GERD (gastroesophageal reflux disease) 08/10/2021   Acute gastroenteritis 08/10/2021   Alcohol abuse 08/10/2021   Elevated LFTs 08/10/2021   COPD with chronic bronchitis ([place]) 12/22/2020   Acute alcohol intoxication delirium with mild use disorder ([place]) 12/22/2020   Cocaine abuse ([place]) 12/22/2020   Closed fracture of left hip ([place]) 02/63/7858   Alcoholic intoxication without complication ([place]) 85/04/7739   Hypomagnesemia 12/21/2020   Hypokalemia 12/21/2020   Hypertension    PCP:  Romualdo Bolk, FNP Pharmacy:   [place], [place] - [address] [place] [address] Phone: [phone] Fax: [phone]     Social Determinants of Health (SDOH) Interventions    Readmission Risk Interventions     View : No data to display.

## 2021-08-11 NOTE — Hospital Course (Signed)
56 year old female with past medical history of COPD, hypertension, chronic nausea and vomiting and alcohol abuse who presented to the emergency room on 5/18 with complaints of midepigastric and left upper quadrant abdominal pain along with nausea and vomiting plus diarrhea for the past few days.  Patient has had intermittent similar issues for the past few years and is being followed by GI at [facility].  CT scan of the abdomen pelvis done 10 days prior was unremarkable.  Patient is scheduled to have an EGD sometime in the next 30 days.  Patient supposed to be on a PPI daily, but has not been taking it.  Over the next 24 to 36 hours, symptoms improved.  Patient's diet able to be advanced and she was tolerating soft bland diet.  Magnesium and potassium able to be replaced.  Patient's diarrhea had improved with Imodium.

## 2021-08-11 NOTE — Progress Notes (Signed)
[facility] Progress Note  Patient: Shirley Evans    OMV:[id number]  DOA: 08/10/2021    Date of Service: the patient was seen and examined on 08/11/2021  Brief hospital course: 56 year old female with past medical history of COPD, hypertension, chronic nausea and vomiting and alcohol abuse who presented to the emergency room on 5/18 with complaints of midepigastric and left upper quadrant abdominal pain along with nausea and vomiting plus diarrhea for the past few days.  Patient has had intermittent similar issues for the past few years and is being followed by GI at [facility].  CT scan of the abdomen pelvis done 10 days prior was unremarkable.  Patient is scheduled to have an EGD sometime in the next 30 days.  Patient supposed to be on a PPI daily, but has not been taking it.  Assessment and Plan: Assessment and Plan: * Intractable nausea and vomiting Could be alcoholic gastritis versus esophagitis.  Patient seems to be tolerating clear liquids.  Continue PPI.  As needed antiemetics.  CT scan on admission unremarkable.  No evidence of obstruction.  Advancing to soft bland diet.  Tolerating, but with minimal p.o.  Hypertensive urgency Continued on lisinopril.  HCTZ held due to hypokalemia and hyponatremia.  With electrolytes better, will restart this evening.  Acute gastroenteritis - She will be hydrated as mentioned above. - PPI therapy will be provided. - We will place on as needed Imodium.  Hypokalemia - Secondary to persistent vomiting.  Potassium will be aggressively replaced.  Follow-up potassium this afternoon normalized  Hypomagnesemia - Magnesium will be aggressively replaced.  Follow-up magnesium this afternoon normalized n.  Elevated LFTs - This could be related to alcoholic hepatitis.  Supporting this is the trending down of LFTs today. - We will hold off statin therapy for now and follow LFTs with hydration.  Alcohol abuse - She will be placed on banana bag daily. - We  will utilize as needed IV Ativan for alcohol withdrawal.  States that last alcohol drink was a week ago.  Alcohol level checked on hospital day 2 was normal.  GERD (gastroesophageal reflux disease) -She will be placed on IV PPI therapy as mentioned above.  Dyslipidemia - We will hold off statin therapy given elevated LFTs.  COPD with chronic bronchitis ([place]) - We will continue albuterol and Wixela/equivalent inhalers.  Stable, no shortness of breath.  Overweight (BMI 25.0-29.9) Meets criteria for BMI greater than 25       Body mass index is 25.05 kg/m.        Consultants: None  Procedures: None  Antimicrobials: None  Code Status: Full code   Subjective: Feeling somewhat better, tolerating some p.o., complains of diarrhea  Objective: Vital signs were reviewed and unremarkable. Vitals:   08/11/21 0749 08/11/21 1648  BP: (!) 152/108 (!) 132/93  Pulse: 80 77  Resp: 16   Temp: 97.9 F (36.6 C) 98.3 F (36.8 C)  SpO2: 98% 97%    Intake/Output Summary (Last 24 hours) at 08/11/2021 1707 Last data filed at 08/11/2021 1651 Gross per 24 hour  Intake 2443.39 ml  Output --  Net 2443.39 ml   Filed Weights   08/10/21 1439 08/10/21 2030  Weight: 63.5 kg 66.2 kg   Body mass index is 25.05 kg/m.  Exam:  General: Alert and oriented x3, no acute distress HEENT: Normocephalic and atraumatic, mucous membranes slightly dry Cardiovascular: Regular rate and rhythm, S1-S2 Respiratory: Clear to auscultation bilaterally Abdomen: Soft, nontender, nondistended, positive bowel sounds Musculoskeletal: No clubbing  or cyanosis or edema Skin: No skin breaks, tears or lesions Psychiatry: Appropriate, no evidence of psychoses Neurology: No focal deficits  Data Reviewed: Follow-up labs note normalized potassium and magnesium.  Disposition:  Status is: Observation     Anticipated discharge date: 5/20  Remaining issues to be resolved so that patient can be discharged:  Resolution of diarrhea, tolerating p.o. and stable electrolytes   Family Communication: Husband at the bedside DVT Prophylaxis: enoxaparin (LOVENOX) injection 40 mg Start: 08/10/21 2200    Author: Annita Brod ,MD 08/11/2021 5:07 PM  To reach On-call, see care teams to locate the attending and reach out via [email or web address]. Between 7PM-7AM, please contact night-coverage If you still have difficulty reaching the attending provider, please page the [facility] (Director on Call) for [facility] on amion for assistance.

## 2021-08-12 DIAGNOSIS — I160 Hypertensive urgency: Secondary | ICD-10-CM | POA: Diagnosis not present

## 2021-08-12 DIAGNOSIS — E876 Hypokalemia: Secondary | ICD-10-CM | POA: Diagnosis not present

## 2021-08-12 DIAGNOSIS — R112 Nausea with vomiting, unspecified: Secondary | ICD-10-CM | POA: Diagnosis not present

## 2021-08-12 LAB — CBC
HCT: 35.7 % — ABNORMAL LOW (ref 36.0–46.0)
Hemoglobin: 12.1 g/dL (ref 12.0–15.0)
MCH: 33.4 pg (ref 26.0–34.0)
MCHC: 33.9 g/dL (ref 30.0–36.0)
MCV: 98.6 fL (ref 80.0–100.0)
Platelets: 124 10*3/uL — ABNORMAL LOW (ref 150–400)
RBC: 3.62 MIL/uL — ABNORMAL LOW (ref 3.87–5.11)
RDW: 14.8 % (ref 11.5–15.5)
WBC: 5.6 10*3/uL (ref 4.0–10.5)
nRBC: 0 % (ref 0.0–0.2)

## 2021-08-12 LAB — COMPREHENSIVE METABOLIC PANEL
ALT: 76 U/L — ABNORMAL HIGH (ref 0–44)
AST: 191 U/L — ABNORMAL HIGH (ref 15–41)
Albumin: 2.8 g/dL — ABNORMAL LOW (ref 3.5–5.0)
Alkaline Phosphatase: 65 U/L (ref 38–126)
Anion gap: 5 (ref 5–15)
BUN: 13 mg/dL (ref 6–20)
CO2: 24 mmol/L (ref 22–32)
Calcium: 7.4 mg/dL — ABNORMAL LOW (ref 8.9–10.3)
Chloride: 108 mmol/L (ref 98–111)
Creatinine, Ser: 0.68 mg/dL (ref 0.44–1.00)
GFR, Estimated: >60 mL/min (ref >60)
Glucose, Bld: 87 mg/dL (ref 70–99)
Potassium: 3.7 mmol/L (ref 3.5–5.1)
Sodium: 137 mmol/L (ref 135–145)
Total Bilirubin: 1.5 mg/dL — ABNORMAL HIGH (ref 0.3–1.2)
Total Protein: 5.4 g/dL — ABNORMAL LOW (ref 6.5–8.1)

## 2021-08-12 MED ORDER — LOPERAMIDE HCL 2 MG PO CAPS: 2.0000 mg | ORAL_CAPSULE | ORAL | 0 refills | Status: DC | PRN

## 2021-08-12 MED ORDER — OMEPRAZOLE 20 MG PO CPDR: 20.0000 mg | DELAYED_RELEASE_CAPSULE | Freq: Two times a day (BID) | ORAL | 11 refills | Status: DC

## 2021-08-12 MED ORDER — LISINOPRIL-HYDROCHLOROTHIAZIDE 20-12.5 MG PO TABS
1.0000 | ORAL_TABLET | Freq: Every day | ORAL | 1 refills | Status: DC
Start: 2021-08-12 — End: 2021-08-12

## 2021-08-12 MED ORDER — LISINOPRIL-HYDROCHLOROTHIAZIDE 20-12.5 MG PO TABS: 1.0000 | ORAL_TABLET | Freq: Every day | ORAL | 1 refills | Status: DC

## 2021-08-12 NOTE — Discharge Summary (Signed)
Physician Discharge Summary   Patient: Shirley Evans MRN: [id number] DOB: 1965/11/22  Admit date:     08/10/2021  Discharge date: 08/12/21  Discharge Physician: Annita Brod   PCP: Romualdo Bolk, FNP   Recommendations at discharge:   New medication: Imodium 2 mg as needed diarrhea Patient will follow-up with her PCP as scheduled on Tuesday, 5/23 Patient advised to follow-up with her gastroenterologist for upcoming endoscopy  Discharge Diagnoses: Principal Problem:   Intractable nausea and vomiting Active Problems:   Hypertensive urgency   Hypokalemia   Hypomagnesemia   Elevated LFTs   Alcohol abuse   GERD (gastroesophageal reflux disease)   Dyslipidemia   COPD with chronic bronchitis ([place])   Overweight (BMI 25.0-29.9)  Resolved Problems:   * No resolved hospital problems. *  Hospital Course: 56 year old female with past medical history of COPD, hypertension, chronic nausea and vomiting and alcohol abuse who presented to the emergency room on 5/18 with complaints of midepigastric and left upper quadrant abdominal pain along with nausea and vomiting plus diarrhea for the past few days.  Patient has had intermittent similar issues for the past few years and is being followed by GI at [facility].  CT scan of the abdomen pelvis done 10 days prior was unremarkable.  Patient is scheduled to have an EGD sometime in the next 30 days.  Patient supposed to be on a PPI daily, but has not been taking it.  Over the next 24 to 36 hours, symptoms improved.  Patient's diet able to be advanced and she was tolerating soft bland diet.  Magnesium and potassium able to be replaced.  Patient's diarrhea had improved with Imodium.  Assessment and Plan: * Intractable nausea and vomiting Could be alcoholic gastritis versus esophagitis.  Patient seems to be tolerating clear liquids.  Continue PPI.  As needed antiemetics.  CT scan on admission unremarkable.  No evidence of obstruction.  Tolerated soft  bland diet.  Advance to more solid food.  Treated with Imodium for diarrhea which greatly improved her symptoms.  Prescription given for Imodium.  Hypertensive urgency Initially continued on lisinopril.  HCTZ held due to hypokalemia and hyponatremia.  With electrolytes better, will restart this evening.  Prescription given for lisinopril/HCTZ, which she has not filled in some time.  Hypokalemia - Secondary to persistent vomiting.  Potassium will be aggressively replaced.  Follow-up potassium normalized  Hypomagnesemia - Magnesium will be aggressively replaced.  Follow-up magnesium normalized.  Elevated LFTs - This could be related to alcoholic hepatitis.  LFTs have improved although still not normalized.  Bilirubin trending downward. - We will hold off statin therapy for now and follow LFTs with hydration.  Alcohol abuse - She will be placed on banana bag daily. - We will utilize as needed IV Ativan for alcohol withdrawal.  States that last alcohol drink was a week ago.  Alcohol level checked on hospital day 2 was normal.  GERD (gastroesophageal reflux disease) -She will be placed on IV PPI therapy as mentioned above.  Dyslipidemia - We will hold off statin therapy given elevated LFTs.  COPD with chronic bronchitis ([place]) - We will continue albuterol and Wixela/equivalent inhalers.  Stable, no shortness of breath.  Overweight (BMI 25.0-29.9) Meets criteria for BMI greater than 25         Consultants: None Procedures performed: None Disposition: Home Diet recommendation:  Discharge Diet Orders (From admission, onward)     Start     Ordered   08/12/21 0000  Diet -  low sodium heart healthy        08/12/21 1209           Cardiac diet DISCHARGE MEDICATION: Allergies as of 08/12/2021       Reactions   Demerol [meperidine] Nausea Only, Nausea And Vomiting   Codeine Nausea Only        Medication List     STOP taking these medications    Aspirin-Acetaminophen  500-325 MG Pack   ibuprofen 800 MG tablet Commonly known as: ADVIL       TAKE these medications    albuterol 108 (90 Base) MCG/ACT inhaler Commonly known as: VENTOLIN HFA Inhale into the lungs.   albuterol (2.5 MG/3ML) 0.083% nebulizer solution Commonly known as: PROVENTIL Inhale into the lungs.   buPROPion 75 MG tablet Commonly known as: WELLBUTRIN Take by mouth.   cholecalciferol 25 MCG (1000 UNIT) tablet Commonly known as: VITAMIN D3 Take 1,000 Units by mouth daily.   cyclobenzaprine 10 MG tablet Commonly known as: FLEXERIL Take 10 mg by mouth daily.   enoxaparin 40 MG/0.4ML injection Commonly known as: LOVENOX Inject 0.4 mLs (40 mg total) into the skin daily for 14 days.   gabapentin 300 MG capsule Commonly known as: NEURONTIN Take 1 capsule by mouth in the morning and at bedtime.   lidocaine 5 % Commonly known as: Roff onto the skin.   lisinopril-hydrochlorothiazide 20-12.5 MG tablet Commonly known as: ZESTORETIC Take 1 tablet by mouth daily. What changed:  how much to take when to take this   loperamide 2 MG capsule Commonly known as: IMODIUM Take 1 capsule (2 mg total) by mouth as needed for diarrhea or loose stools.   omeprazole 20 MG capsule Commonly known as: PRILOSEC Take 1 capsule (20 mg total) by mouth 2 (two) times daily before a meal. What changed: when to take this   rosuvastatin 40 MG tablet Commonly known as: CRESTOR Take 40 mg by mouth daily.   sertraline 100 MG tablet Commonly known as: ZOLOFT Take 100 mg by mouth daily.   valACYclovir 1000 MG tablet Commonly known as: VALTREX Take 1,000 mg by mouth daily.        Discharge Exam: Filed Weights   08/10/21 1439 08/10/21 2030  Weight: 63.5 kg 66.2 kg   General: Alert and oriented x3, no acute distress Cardiovascular: Regular rate and rhythm, S1-S2 Lungs: Clear to auscultation bilaterally  Condition at discharge: good  The results of significant diagnostics  from this hospitalization (including imaging, microbiology, ancillary and laboratory) are listed below for reference.   Imaging Studies: CT ABDOMEN PELVIS W CONTRAST  Result Date: 08/10/2021 CLINICAL DATA:  Left upper quadrant abdominal discomfort. Vomiting and diarrhea. Symptoms for 2 months, worsening over the past 2 days. EXAM: CT ABDOMEN AND PELVIS WITH CONTRAST TECHNIQUE: Multidetector CT imaging of the abdomen and pelvis was performed using the standard protocol following bolus administration of intravenous contrast. RADIATION DOSE REDUCTION: This exam was performed according to the departmental dose-optimization program which includes automated exposure control, adjustment of the mA and/or kV according to patient size and/or use of iterative reconstruction technique. CONTRAST:  142m OMNIPAQUE IOHEXOL 300 MG/ML  SOLN COMPARISON:  08/02/2021 FINDINGS: Lower chest: Emphysema. Normal heart size without pericardial or pleural effusion. Hepatobiliary: Hepatomegaly at 20.3 cm craniocaudal. Marked hepatic steatosis. Small gallstones without acute cholecystitis or biliary duct dilatation. Pancreas: Normal, without mass or ductal dilatation. Spleen: Normal in size, without focal abnormality. Adrenals/Urinary Tract: Normal adrenal glands. Mild bilateral renal cortical scarring. Too small  to characterize lesions within the left kidney. Interpolar left renal 6 mm collecting system calculus. No hydronephrosis. Degraded evaluation of the pelvis, secondary to beam hardening artifact from left hip arthroplasty. Grossly normal urinary bladder. Stomach/Bowel: Tiny hiatal hernia. Apparent mild gastric antral wall thickening on 30/2 is favored to be due to underdistention. Normal colon and terminal ileum. Normal appendix, including on 62/2. Normal small bowel. Vascular/Lymphatic: Aortic atherosclerosis. Multiple renal arteries bilaterally. No abdominopelvic adenopathy. Reproductive: Normal uterus and adnexa. Other: No  significant free fluid.  No free intraperitoneal air. Musculoskeletal: Left hip arthroplasty. Advanced degenerative disc disease at the lumbosacral junction. IMPRESSION: 1. No acute process in the abdomen or pelvis. 2. Hepatomegaly and hepatic steatosis. 3. Cholelithiasis. 4. Left nephrolithiasis. 5. Tiny hiatal hernia. 6. Degraded evaluation of the pelvis, secondary to beam hardening artifact from left hip arthroplasty. 7. Aortic Atherosclerosis (ICD10-I70.0) and Emphysema (ICD10-J43.9). Electronically Signed   By: Abigail Miyamoto M.D.   On: 08/10/2021 16:48   CT ABDOMEN PELVIS W CONTRAST  Result Date: 08/03/2021 CLINICAL DATA:  Abdominal bloating and nausea EXAM: CT ABDOMEN AND PELVIS WITH CONTRAST TECHNIQUE: Multidetector CT imaging of the abdomen and pelvis was performed using the standard protocol following bolus administration of intravenous contrast. RADIATION DOSE REDUCTION: This exam was performed according to the departmental dose-optimization program which includes automated exposure control, adjustment of the mA and/or kV according to patient size and/or use of iterative reconstruction technique. CONTRAST:  1108m OMNIPAQUE IOHEXOL 300 MG/ML  SOLN COMPARISON:  None Available. FINDINGS: Lower chest: No acute abnormality. Hepatobiliary: Liver is enlarged measuring 22.3 cm in length with hypodense appearance of the parenchyma suggesting steatosis. No focal mass identified. Gallbladder contains small densities consistent with cholelithiasis. No biliary ductal dilatation. Pancreas: Unremarkable. No pancreatic ductal dilatation or surrounding inflammatory changes. Spleen: Normal in size without focal abnormality. Adrenals/Urinary Tract: Adrenal glands are normal. 7 mm calculus in the mid left kidney. Renal cortical thinning and mild lobulation bilaterally. A few small cysts in the left kidney. No hydronephrosis or enhancing renal mass identified. Urinary bladder appears normal. Stomach/Bowel: No bowel  obstruction, free air or pneumatosis. No bowel wall edema identified. Appendix is normal. Vascular/Lymphatic: Moderate to severe atherosclerotic disease. No bulky lymphadenopathy visualized. Reproductive: Uterus and bilateral adnexa are unremarkable. Other: No ascites. Musculoskeletal: Advanced degenerative changes at L5-S1. Left hip prosthesis. No suspicious bony lesions identified. IMPRESSION: 1. No acute process identified. 2. Hepatomegaly and evidence of hepatic steatosis. 3. Cholelithiasis. 4. Left renal calculus. Bilateral renal cortical thinning and lobulation. Electronically Signed   By: DOfilia NeasM.D.   On: 08/03/2021 11:36    Microbiology: Results for orders placed or performed during the hospital encounter of 08/10/21  Gastrointestinal Panel by PCR , Stool     Status: None   Collection Time: 08/11/21 12:32 PM   Specimen: Stool  Result Value Ref Range Status   Campylobacter species NOT DETECTED NOT DETECTED Final   Plesimonas shigelloides NOT DETECTED NOT DETECTED Final   Salmonella species NOT DETECTED NOT DETECTED Final   Yersinia enterocolitica NOT DETECTED NOT DETECTED Final   Vibrio species NOT DETECTED NOT DETECTED Final   Vibrio cholerae NOT DETECTED NOT DETECTED Final   Enteroaggregative E coli (EAEC) NOT DETECTED NOT DETECTED Final   Enteropathogenic E coli (EPEC) NOT DETECTED NOT DETECTED Final   Enterotoxigenic E coli (ETEC) NOT DETECTED NOT DETECTED Final   Shiga like toxin producing E coli (STEC) NOT DETECTED NOT DETECTED Final   Shigella/Enteroinvasive E coli (EIEC) NOT DETECTED NOT DETECTED  Final   Cryptosporidium NOT DETECTED NOT DETECTED Final   Cyclospora cayetanensis NOT DETECTED NOT DETECTED Final   Entamoeba histolytica NOT DETECTED NOT DETECTED Final   Giardia lamblia NOT DETECTED NOT DETECTED Final   Adenovirus F40/41 NOT DETECTED NOT DETECTED Final   Astrovirus NOT DETECTED NOT DETECTED Final   Norovirus GI/GII NOT DETECTED NOT DETECTED Final    Rotavirus A NOT DETECTED NOT DETECTED Final   Sapovirus (I, II, IV, and V) NOT DETECTED NOT DETECTED Final    Comment: Performed at [facility], [address], [place], [place] [address]  C Difficile Quick Screen w PCR reflex     Status: None   Collection Time: 08/11/21 12:32 PM  Result Value Ref Range Status   C Diff antigen NEGATIVE NEGATIVE Final   C Diff toxin NEGATIVE NEGATIVE Final   C Diff interpretation No C. difficile detected.  Final    Comment: Performed at [facility], [address] [place], [place] [address]    Labs: CBC: Recent Labs  Lab 08/10/21 1440 08/11/21 0358 08/12/21 0420  WBC 12.4* 9.0 5.6  HGB 17.8* 14.4 12.1  HCT 49.6* 41.8 35.7*  MCV 91.7 95.0 98.6  PLT 323 183 295*   Basic Metabolic Panel: Recent Labs  Lab 08/10/21 1440 08/11/21 0358 08/11/21 1413 08/12/21 0420  NA 136 137 134* 137  K 2.8* 3.0* 3.6 3.7  CL 90* 96* 101 108  CO2 '26 28 24 24  '$ GLUCOSE 195* 92 158* 87  BUN '16 15 15 13  '$ CREATININE 1.09* 0.67 0.77 0.68  CALCIUM 9.0 7.6* 7.8* 7.4*  MG 0.7* 1.5* 1.9  --    Liver Function Tests: Recent Labs  Lab 08/10/21 1440 08/11/21 0358 08/12/21 0420  AST 108* 88* 191*  ALT 68* 50* 76*  ALKPHOS 110 84 65  BILITOT 4.0* 3.0* 1.5*  PROT 8.7* 6.2* 5.4*  ALBUMIN 4.5 3.3* 2.8*   CBG: No results for input(s): GLUCAP in the last 168 hours.  Discharge time spent: less than 30 minutes.  Signed: Annita Brod, MD [facility] 08/12/2021

## 2021-08-12 NOTE — Progress Notes (Signed)
   08/12/21 1335  AVS Discharge Documentation  AVS Discharge Instructions Including Medications Provided to patient/caregiver  Name of Person Receiving AVS Discharge Instructions Including Medications Barbaraann Share  Name of Clinician That Reviewed AVS Discharge Instructions Including Medications Gunnar Bulla, RN

## 2021-08-12 NOTE — Progress Notes (Signed)
Shirley Evans to be D/C'd  per MD order.  Discussed with the patient and all questions fully answered.  VSS, Skin clean, dry and intact without evidence of skin break down, no evidence of skin tears noted.  IV catheter discontinued intact. Site without signs and symptoms of complications. Dressing and pressure applied.  An After Visit Summary was printed and given to the patient. Patient received prescription.  D/c education completed with patient/family including follow up instructions, medication list, d/c activities limitations if indicated, with other d/c instructions as indicated by MD - patient able to verbalize understanding, all questions fully answered.   Patient instructed to return to ED, call 911, or call MD for any changes in condition.   Patient to be escorted via [place], and D/C home via private auto.

## 2021-08-24 ENCOUNTER — Emergency Department: Payer: Medicare HMO

## 2021-08-24 ENCOUNTER — Inpatient Hospital Stay
Admission: EM | Admit: 2021-08-24 | Discharge: 2021-09-01 | DRG: 683 | Disposition: A | Discharge status: Home Health | Payer: Medicare HMO | Attending: Internal Medicine | Admitting: Internal Medicine

## 2021-08-24 ENCOUNTER — Encounter: Payer: Self-pay | Admitting: *Deleted

## 2021-08-24 ENCOUNTER — Other Ambulatory Visit: Payer: Self-pay

## 2021-08-24 DIAGNOSIS — R6511 Systemic inflammatory response syndrome (SIRS) of non-infectious origin with acute organ dysfunction: Secondary | ICD-10-CM | POA: Diagnosis present

## 2021-08-24 DIAGNOSIS — N179 Acute kidney failure, unspecified: Secondary | ICD-10-CM | POA: Diagnosis not present

## 2021-08-24 DIAGNOSIS — Z8249 Family history of ischemic heart disease and other diseases of the circulatory system: Secondary | ICD-10-CM

## 2021-08-24 DIAGNOSIS — R6510 Systemic inflammatory response syndrome (SIRS) of non-infectious origin without acute organ dysfunction: Secondary | ICD-10-CM | POA: Diagnosis present

## 2021-08-24 DIAGNOSIS — K8020 Calculus of gallbladder without cholecystitis without obstruction: Secondary | ICD-10-CM | POA: Diagnosis present

## 2021-08-24 DIAGNOSIS — N200 Calculus of kidney: Secondary | ICD-10-CM | POA: Diagnosis present

## 2021-08-24 DIAGNOSIS — R1110 Vomiting, unspecified: Secondary | ICD-10-CM

## 2021-08-24 DIAGNOSIS — F1020 Alcohol dependence, uncomplicated: Secondary | ICD-10-CM | POA: Diagnosis present

## 2021-08-24 DIAGNOSIS — F17210 Nicotine dependence, cigarettes, uncomplicated: Secondary | ICD-10-CM | POA: Diagnosis present

## 2021-08-24 DIAGNOSIS — R7989 Other specified abnormal findings of blood chemistry: Secondary | ICD-10-CM | POA: Diagnosis present

## 2021-08-24 DIAGNOSIS — E871 Hypo-osmolality and hyponatremia: Secondary | ICD-10-CM | POA: Diagnosis present

## 2021-08-24 DIAGNOSIS — J449 Chronic obstructive pulmonary disease, unspecified: Secondary | ICD-10-CM | POA: Diagnosis present

## 2021-08-24 DIAGNOSIS — M8008XA Age-related osteoporosis with current pathological fracture, vertebra(e), initial encounter for fracture: Secondary | ICD-10-CM

## 2021-08-24 DIAGNOSIS — Z96642 Presence of left artificial hip joint: Secondary | ICD-10-CM | POA: Diagnosis present

## 2021-08-24 DIAGNOSIS — Z885 Allergy status to narcotic agent status: Secondary | ICD-10-CM

## 2021-08-24 DIAGNOSIS — Z8619 Personal history of other infectious and parasitic diseases: Secondary | ICD-10-CM

## 2021-08-24 DIAGNOSIS — R109 Unspecified abdominal pain: Secondary | ICD-10-CM | POA: Diagnosis not present

## 2021-08-24 DIAGNOSIS — K219 Gastro-esophageal reflux disease without esophagitis: Secondary | ICD-10-CM | POA: Diagnosis present

## 2021-08-24 DIAGNOSIS — I10 Essential (primary) hypertension: Secondary | ICD-10-CM | POA: Diagnosis present

## 2021-08-24 DIAGNOSIS — E876 Hypokalemia: Secondary | ICD-10-CM | POA: Diagnosis present

## 2021-08-24 DIAGNOSIS — D696 Thrombocytopenia, unspecified: Secondary | ICD-10-CM | POA: Diagnosis not present

## 2021-08-24 DIAGNOSIS — Z780 Asymptomatic menopausal state: Secondary | ICD-10-CM

## 2021-08-24 DIAGNOSIS — Z79899 Other long term (current) drug therapy: Secondary | ICD-10-CM

## 2021-08-24 DIAGNOSIS — Z833 Family history of diabetes mellitus: Secondary | ICD-10-CM

## 2021-08-24 DIAGNOSIS — K449 Diaphragmatic hernia without obstruction or gangrene: Secondary | ICD-10-CM | POA: Diagnosis present

## 2021-08-24 DIAGNOSIS — E785 Hyperlipidemia, unspecified: Secondary | ICD-10-CM | POA: Diagnosis present

## 2021-08-24 DIAGNOSIS — K760 Fatty (change of) liver, not elsewhere classified: Secondary | ICD-10-CM | POA: Diagnosis present

## 2021-08-24 DIAGNOSIS — Z803 Family history of malignant neoplasm of breast: Secondary | ICD-10-CM

## 2021-08-24 DIAGNOSIS — A419 Sepsis, unspecified organism: Secondary | ICD-10-CM

## 2021-08-24 DIAGNOSIS — K529 Noninfective gastroenteritis and colitis, unspecified: Secondary | ICD-10-CM | POA: Diagnosis present

## 2021-08-24 DIAGNOSIS — S22009A Unspecified fracture of unspecified thoracic vertebra, initial encounter for closed fracture: Secondary | ICD-10-CM

## 2021-08-24 DIAGNOSIS — F32A Depression, unspecified: Secondary | ICD-10-CM | POA: Diagnosis present

## 2021-08-24 LAB — CBC
HCT: 50.5 % — ABNORMAL HIGH (ref 36.0–46.0)
Hemoglobin: 18.2 g/dL — ABNORMAL HIGH (ref 12.0–15.0)
MCH: 34.1 pg — ABNORMAL HIGH (ref 26.0–34.0)
MCHC: 36 g/dL (ref 30.0–36.0)
MCV: 94.6 fL (ref 80.0–100.0)
Platelets: 341 10*3/uL (ref 150–400)
RBC: 5.34 MIL/uL — ABNORMAL HIGH (ref 3.87–5.11)
RDW: 14.5 % (ref 11.5–15.5)
WBC: 29.8 10*3/uL — ABNORMAL HIGH (ref 4.0–10.5)
nRBC: 0 % (ref 0.0–0.2)

## 2021-08-24 LAB — COMPREHENSIVE METABOLIC PANEL
ALT: 75 U/L — ABNORMAL HIGH (ref 0–44)
AST: 113 U/L — ABNORMAL HIGH (ref 15–41)
Albumin: 4.7 g/dL (ref 3.5–5.0)
Alkaline Phosphatase: 108 U/L (ref 38–126)
Anion gap: 24 — ABNORMAL HIGH (ref 5–15)
BUN: 32 mg/dL — ABNORMAL HIGH (ref 6–20)
CO2: 24 mmol/L (ref 22–32)
Calcium: 9.6 mg/dL (ref 8.9–10.3)
Chloride: 82 mmol/L — ABNORMAL LOW (ref 98–111)
Creatinine, Ser: 2.49 mg/dL — ABNORMAL HIGH (ref 0.44–1.00)
GFR, Estimated: 22 mL/min — ABNORMAL LOW (ref >60)
Glucose, Bld: 205 mg/dL — ABNORMAL HIGH (ref 70–99)
Potassium: 2.7 mmol/L — CL (ref 3.5–5.1)
Sodium: 130 mmol/L — ABNORMAL LOW (ref 135–145)
Total Bilirubin: 3.5 mg/dL — ABNORMAL HIGH (ref 0.3–1.2)
Total Protein: 9 g/dL — ABNORMAL HIGH (ref 6.5–8.1)

## 2021-08-24 LAB — URINALYSIS, ROUTINE W REFLEX MICROSCOPIC
Bacteria, UA: NONE SEEN
Bilirubin Urine: NEGATIVE
Glucose, UA: 50 mg/dL — AB
Ketones, ur: 5 mg/dL — AB
Leukocytes,Ua: NEGATIVE
Nitrite: NEGATIVE
Protein, ur: 100 mg/dL — AB
Specific Gravity, Urine: 1.021 (ref 1.005–1.030)
pH: 5 (ref 5.0–8.0)

## 2021-08-24 LAB — LIPASE, BLOOD: Lipase: 27 U/L (ref 11–51)

## 2021-08-24 MED ORDER — ONDANSETRON HCL 4 MG/2ML IJ SOLN
4.0000 mg | Freq: Once | INTRAMUSCULAR | Status: AC
  Administered 2021-08-25: 4 mg via INTRAVENOUS
  Filled 2021-08-24: qty 2

## 2021-08-24 MED ORDER — SODIUM CHLORIDE 0.9 % IV BOLUS
1000.0000 mL | Freq: Once | INTRAVENOUS | Status: AC
  Administered 2021-08-25: 1000 mL via INTRAVENOUS

## 2021-08-24 MED ORDER — MAGNESIUM SULFATE 2 GM/50ML IV SOLN
2.0000 g | Freq: Once | INTRAVENOUS | Status: AC
  Administered 2021-08-25: 2 g via INTRAVENOUS
  Filled 2021-08-24: qty 50

## 2021-08-24 MED ORDER — METRONIDAZOLE 500 MG/100ML IV SOLN
500.0000 mg | Freq: Once | INTRAVENOUS | Status: AC
  Administered 2021-08-25: 500 mg via INTRAVENOUS
  Filled 2021-08-24: qty 100

## 2021-08-24 MED ORDER — POTASSIUM CHLORIDE 10 MEQ/100ML IV SOLN
10.0000 meq | INTRAVENOUS | Status: AC
  Administered 2021-08-25 (×4): 10 meq via INTRAVENOUS
  Filled 2021-08-24 (×3): qty 100

## 2021-08-24 MED ORDER — VANCOMYCIN HCL IN DEXTROSE 1-5 GM/200ML-% IV SOLN: 1000.0000 mg | Freq: Once | INTRAVENOUS | Status: DC

## 2021-08-24 MED ORDER — SODIUM CHLORIDE 0.9 % IV SOLN
2.0000 g | Freq: Once | INTRAVENOUS | Status: AC
  Administered 2021-08-25: 2 g via INTRAVENOUS
  Filled 2021-08-24: qty 12.5

## 2021-08-24 NOTE — ED Provider Notes (Signed)
[facility] Provider Note    Event Date/Time   First MD Initiated Contact with Patient 08/24/21 2320     (approximate)   History   Chief Complaint Abdominal Pain   HPI  Shirley Evans is a 56 y.o. female with past medical history of hypertension, COPD, alcohol abuse, and chronic nausea and vomiting who presents to the ED complaining of abdominal pain with nausea and vomiting.  Patient reports that she was admitted to the hospital for intractable nausea and vomiting a couple of weeks ago, was feeling well at the time of discharge but had a flareup of the symptoms again 3 days ago.  She has states she has not been able to keep down either liquids or solids for 3 days, has additionally been having diarrhea.  She denies any blood in her emesis or stool, does endorse sharp pain along both sides of her upper abdomen as well as the middle of her back.  She has not had any fevers and denies any dysuria or flank pain.  She has not had any cough, chest pain, or shortness of breath.  She does admit to recent alcohol consumption, with her last drink coming 5 days ago.     Physical Exam   Triage Vital Signs: ED Triage Vitals  Enc Vitals Group     BP 08/24/21 1932 (!) 170/133     Pulse Rate 08/24/21 1932 (!) 124     Resp 08/24/21 1932 (!) 28     Temp 08/24/21 1932 97.6 F (36.4 C)     Temp Source 08/24/21 1932 Oral     SpO2 08/24/21 1932 95 %     Weight 08/24/21 1928 145 lb (65.8 kg)     Height 08/24/21 1928 '5\' 4"'$  (1.626 m)     Head Circumference --      Peak Flow --      Pain Score 08/24/21 1928 10     Pain Loc --      Pain Edu? --      Excl. in [place]? --     Most recent vital signs: Vitals:   08/24/21 1932 08/25/21 0040  BP: (!) 170/133 (!) 178/131  Pulse: (!) 124 (!) 102  Resp: (!) 28 (!) 26  Temp: 97.6 F (36.4 C)   SpO2: 95% (!) 88%    Constitutional: Alert and oriented. Eyes: Conjunctivae are normal. Head: Atraumatic. Nose: No  congestion/rhinnorhea. Mouth/Throat: Mucous membranes are dry.  Cardiovascular: Tachycardic, regular rhythm. Grossly normal heart sounds.  2+ radial pulses bilaterally. Respiratory: Normal respiratory effort.  No retractions. Lungs CTAB. Gastrointestinal: Soft and diffusely tender to palpation with no rebound or guarding.  Mild distention noted without fluid wave. Musculoskeletal: No lower extremity tenderness nor edema.  Neurologic:  Normal speech and language. No gross focal neurologic deficits are appreciated.    ED Results / Procedures / Treatments   Labs (all labs ordered are listed, but only abnormal results are displayed) Labs Reviewed  COMPREHENSIVE METABOLIC PANEL - Abnormal; Notable for the following components:      Result Value   Sodium 130 (*)    Potassium 2.7 (*)    Chloride 82 (*)    Glucose, Bld 205 (*)    BUN 32 (*)    Creatinine, Ser 2.49 (*)    Total Protein 9.0 (*)    AST 113 (*)    ALT 75 (*)    Total Bilirubin 3.5 (*)    GFR, Estimated 22 (*)  Anion gap 24 (*)    All other components within normal limits  CBC - Abnormal; Notable for the following components:   WBC 29.8 (*)    RBC 5.34 (*)    Hemoglobin 18.2 (*)    HCT 50.5 (*)    MCH 34.1 (*)    All other components within normal limits  URINALYSIS, ROUTINE W REFLEX MICROSCOPIC - Abnormal; Notable for the following components:   Color, Urine AMBER (*)    APPearance CLOUDY (*)    Glucose, UA 50 (*)    Hgb urine dipstick LARGE (*)    Ketones, ur 5 (*)    Protein, ur 100 (*)    All other components within normal limits  LACTIC ACID, PLASMA - Abnormal; Notable for the following components:   Lactic Acid, Venous 3.1 (*)    All other components within normal limits  TROPONIN I (HIGH SENSITIVITY) - Abnormal; Notable for the following components:   Troponin I (High Sensitivity) 30 (*)    All other components within normal limits  CULTURE, BLOOD (ROUTINE X 2)  CULTURE, BLOOD (ROUTINE X 2)  LIPASE,  BLOOD  PROTIME-INR  ETHANOL  MAGNESIUM  LACTIC ACID, PLASMA  PROCALCITONIN     EKG  ED ECG REPORT I, Blake Divine, the attending physician, personally viewed and interpreted this ECG.   Date: 08/24/2021  EKG Time: 0:34  Rate: 103  Rhythm: sinus tachycardia  Axis: Normal  Intervals:none  ST&T Change: ST elevation in aVR with diffuse ST depressions concerning for global ischemia  RADIOLOGY Chest x-ray reviewed and interpreted by me with no infiltrate, edema, or effusion.  PROCEDURES:  Critical Care performed: Yes, see critical care procedure note(s)  .Critical Care Performed by: Blake Divine, MD Authorized by: Blake Divine, MD   Critical care provider statement:    Critical care time (minutes):  30   Critical care time was exclusive of:  Separately billable procedures and treating other patients and teaching time   Critical care was necessary to treat or prevent imminent or life-threatening deterioration of the following conditions:  Sepsis   Critical care was time spent personally by me on the following activities:  Development of treatment plan with patient or surrogate, discussions with consultants, evaluation of patient's response to treatment, examination of patient, ordering and review of laboratory studies, ordering and review of radiographic studies, ordering and performing treatments and interventions, pulse oximetry, re-evaluation of patient's condition and review of old charts   I assumed direction of critical care for this patient from another provider in my specialty: no     Care discussed with: admitting provider     MEDICATIONS ORDERED IN ED: Medications  metroNIDAZOLE (FLAGYL) IVPB 500 mg (500 mg Intravenous New Bag/Given 08/25/21 0109)  potassium chloride 10 mEq in 100 mL IVPB (10 mEq Intravenous New Bag/Given 08/25/21 0101)  magnesium sulfate IVPB 2 g 50 mL (2 g Intravenous New Bag/Given 08/25/21 0104)  vancomycin (VANCOREADY) IVPB 1500 mg/300 mL (has  no administration in time range)  sodium chloride 0.9 % bolus 1,000 mL (1,000 mLs Intravenous New Bag/Given 08/25/21 0037)  ondansetron (ZOFRAN) injection 4 mg (4 mg Intravenous Given 08/25/21 0036)  ceFEPIme (MAXIPIME) 2 g in sodium chloride 0.9 % 100 mL IVPB (0 g Intravenous Stopped 08/25/21 0108)  morphine (PF) 4 MG/ML injection 4 mg (4 mg Intravenous Given 08/25/21 0106)     IMPRESSION / MDM / [place] / ED COURSE  I reviewed the triage vital signs and the nursing notes.  56 y.o. female with past medical history of hypertension, COPD, alcohol abuse, and chronic nausea and vomiting who presents to the ED with another episode of nausea and vomiting over the past 3 days, during which time she has been unable to keep anything down and has developed pain in both sides of her upper abdomen.  Patient's presentation is most consistent with acute presentation with potential threat to life or bodily function.  Differential diagnosis includes, but is not limited to, cholecystitis, biliary colic, pancreatitis, hepatitis, alcoholic gastritis, AKI, electrolyte abnormality, alcoholic ketoacidosis, sepsis, UTI, SBP.  Patient ill-appearing on my assessment, actively vomiting watery material with no blood noted.  Vitals remarkable for tachycardia, tachypnea, and elevated blood pressure with no fever.  Initial labs show significant leukocytosis, which along with her vital signs is concerning for sepsis and we will start on broad-spectrum antibiotics.  Urinalysis does not appear consistent with infection and given her abdominal tenderness we will further assess with CT of her abdomen/pelvis, also check chest x-ray given she is at risk for aspiration.  BMP shows hypokalemia along with significant AKI and elevated anion gap.  No acidosis to suggest DKA.  LFTs show mild transaminitis that appears chronic, bilirubin also mildly elevated similar to her recent admission.  We will hydrate  with IV fluids as I suspect her AKI is secondary to hypovolemia.  Chest x-ray is unremarkable, CT of abdomen/pelvis shows no source of infection but does show multiple thoracic and lumbar spinal fractures which are the likely source of her pain.  We will place patient in TLSO brace and plan for MRI during admission to further assess, but no focal neurologic findings to suggest cord compression at this time.  Urinalysis does not appear consistent with infection and no obvious source of sepsis at this time.  Patient receiving broad-spectrum antibiotics as well as IV fluids and electrolyte repletion, case discussed with hospitalist for admission.      FINAL CLINICAL IMPRESSION(S) / ED DIAGNOSES   Final diagnoses:  AKI (acute kidney injury) ([place])  Hypokalemia  Intractable vomiting  Sepsis without acute organ dysfunction, due to unspecified organism [facility])  Closed fracture of multiple thoracic vertebrae, initial encounter ([place])     Rx / DC Orders   ED Discharge Orders     None        Note:  This document was prepared using Dragon voice recognition software and may include unintentional dictation errors.   Blake Divine, MD 08/25/21 [phone]

## 2021-08-24 NOTE — ED Triage Notes (Signed)
Pt has abd pain with vomiting.  Pt has diarrhea also.  Sx for approx 2 weeks.  Pt was seen here 2 weeks ago for similar sx.  Pt alert.

## 2021-08-25 ENCOUNTER — Other Ambulatory Visit: Payer: Medicare HMO

## 2021-08-25 ENCOUNTER — Emergency Department: Payer: Medicare HMO

## 2021-08-25 ENCOUNTER — Inpatient Hospital Stay: Payer: Medicare HMO

## 2021-08-25 ENCOUNTER — Encounter: Payer: Self-pay | Admitting: Family Medicine

## 2021-08-25 DIAGNOSIS — A419 Sepsis, unspecified organism: Secondary | ICD-10-CM | POA: Diagnosis not present

## 2021-08-25 DIAGNOSIS — N179 Acute kidney failure, unspecified: Secondary | ICD-10-CM

## 2021-08-25 DIAGNOSIS — I10 Essential (primary) hypertension: Secondary | ICD-10-CM | POA: Diagnosis present

## 2021-08-25 DIAGNOSIS — E785 Hyperlipidemia, unspecified: Secondary | ICD-10-CM

## 2021-08-25 DIAGNOSIS — S22009A Unspecified fracture of unspecified thoracic vertebra, initial encounter for closed fracture: Secondary | ICD-10-CM

## 2021-08-25 DIAGNOSIS — K8020 Calculus of gallbladder without cholecystitis without obstruction: Secondary | ICD-10-CM | POA: Diagnosis present

## 2021-08-25 DIAGNOSIS — F17210 Nicotine dependence, cigarettes, uncomplicated: Secondary | ICD-10-CM | POA: Diagnosis present

## 2021-08-25 DIAGNOSIS — R1110 Vomiting, unspecified: Secondary | ICD-10-CM | POA: Diagnosis not present

## 2021-08-25 DIAGNOSIS — K529 Noninfective gastroenteritis and colitis, unspecified: Secondary | ICD-10-CM | POA: Diagnosis present

## 2021-08-25 DIAGNOSIS — K449 Diaphragmatic hernia without obstruction or gangrene: Secondary | ICD-10-CM | POA: Diagnosis present

## 2021-08-25 DIAGNOSIS — J449 Chronic obstructive pulmonary disease, unspecified: Secondary | ICD-10-CM | POA: Diagnosis present

## 2021-08-25 DIAGNOSIS — Z96642 Presence of left artificial hip joint: Secondary | ICD-10-CM | POA: Diagnosis present

## 2021-08-25 DIAGNOSIS — E876 Hypokalemia: Secondary | ICD-10-CM | POA: Diagnosis present

## 2021-08-25 DIAGNOSIS — Z8249 Family history of ischemic heart disease and other diseases of the circulatory system: Secondary | ICD-10-CM | POA: Diagnosis not present

## 2021-08-25 DIAGNOSIS — K760 Fatty (change of) liver, not elsewhere classified: Secondary | ICD-10-CM | POA: Diagnosis present

## 2021-08-25 DIAGNOSIS — F32A Depression, unspecified: Secondary | ICD-10-CM | POA: Diagnosis present

## 2021-08-25 DIAGNOSIS — M8008XA Age-related osteoporosis with current pathological fracture, vertebra(e), initial encounter for fracture: Secondary | ICD-10-CM

## 2021-08-25 DIAGNOSIS — Z833 Family history of diabetes mellitus: Secondary | ICD-10-CM | POA: Diagnosis not present

## 2021-08-25 DIAGNOSIS — Z780 Asymptomatic menopausal state: Secondary | ICD-10-CM | POA: Diagnosis not present

## 2021-08-25 DIAGNOSIS — Z8619 Personal history of other infectious and parasitic diseases: Secondary | ICD-10-CM | POA: Diagnosis not present

## 2021-08-25 DIAGNOSIS — E871 Hypo-osmolality and hyponatremia: Secondary | ICD-10-CM | POA: Diagnosis present

## 2021-08-25 DIAGNOSIS — K219 Gastro-esophageal reflux disease without esophagitis: Secondary | ICD-10-CM | POA: Diagnosis present

## 2021-08-25 DIAGNOSIS — R7989 Other specified abnormal findings of blood chemistry: Secondary | ICD-10-CM | POA: Diagnosis present

## 2021-08-25 DIAGNOSIS — N200 Calculus of kidney: Secondary | ICD-10-CM | POA: Diagnosis present

## 2021-08-25 DIAGNOSIS — R109 Unspecified abdominal pain: Secondary | ICD-10-CM | POA: Diagnosis present

## 2021-08-25 DIAGNOSIS — F1020 Alcohol dependence, uncomplicated: Secondary | ICD-10-CM | POA: Diagnosis present

## 2021-08-25 DIAGNOSIS — R6511 Systemic inflammatory response syndrome (SIRS) of non-infectious origin with acute organ dysfunction: Secondary | ICD-10-CM | POA: Diagnosis present

## 2021-08-25 LAB — BASIC METABOLIC PANEL
Anion gap: 16 — ABNORMAL HIGH (ref 5–15)
BUN: 34 mg/dL — ABNORMAL HIGH (ref 6–20)
CO2: 27 mmol/L (ref 22–32)
Calcium: 8.1 mg/dL — ABNORMAL LOW (ref 8.9–10.3)
Chloride: 88 mmol/L — ABNORMAL LOW (ref 98–111)
Creatinine, Ser: 2.16 mg/dL — ABNORMAL HIGH (ref 0.44–1.00)
GFR, Estimated: 26 mL/min — ABNORMAL LOW (ref >60)
Glucose, Bld: 138 mg/dL — ABNORMAL HIGH (ref 70–99)
Potassium: 2.9 mmol/L — ABNORMAL LOW (ref 3.5–5.1)
Sodium: 131 mmol/L — ABNORMAL LOW (ref 135–145)

## 2021-08-25 LAB — TROPONIN I (HIGH SENSITIVITY): Troponin I (High Sensitivity): 30 ng/L — ABNORMAL HIGH (ref <18)

## 2021-08-25 LAB — CBC
HCT: 46.5 % — ABNORMAL HIGH (ref 36.0–46.0)
Hemoglobin: 16.7 g/dL — ABNORMAL HIGH (ref 12.0–15.0)
MCH: 34.6 pg — ABNORMAL HIGH (ref 26.0–34.0)
MCHC: 35.9 g/dL (ref 30.0–36.0)
MCV: 96.5 fL (ref 80.0–100.0)
Platelets: 197 10*3/uL (ref 150–400)
RBC: 4.82 MIL/uL (ref 3.87–5.11)
RDW: 14.5 % (ref 11.5–15.5)
WBC: 20 10*3/uL — ABNORMAL HIGH (ref 4.0–10.5)
nRBC: 0 % (ref 0.0–0.2)

## 2021-08-25 LAB — PROTIME-INR
INR: 1.1 (ref 0.8–1.2)
INR: 1.1 (ref 0.8–1.2)
Prothrombin Time: 14 seconds (ref 11.4–15.2)
Prothrombin Time: 14.3 seconds (ref 11.4–15.2)

## 2021-08-25 LAB — ETHANOL: Alcohol, Ethyl (B): <10 mg/dL (ref <10)

## 2021-08-25 LAB — LACTIC ACID, PLASMA
Lactic Acid, Venous: 3.1 mmol/L (ref 0.5–1.9)
Lactic Acid, Venous: 2.5 mmol/L (ref 0.5–1.9)
Lactic Acid, Venous: 0.9 mmol/L (ref 0.5–1.9)

## 2021-08-25 LAB — MRSA NEXT GEN BY PCR, NASAL: MRSA by PCR Next Gen: NOT DETECTED

## 2021-08-25 LAB — CORTISOL-AM, BLOOD: Cortisol - AM: 74.4 ug/dL — ABNORMAL HIGH (ref 6.7–22.6)

## 2021-08-25 LAB — PROCALCITONIN
Procalcitonin: 0.27 ng/mL
Procalcitonin: 0.19 ng/mL

## 2021-08-25 LAB — MAGNESIUM: Magnesium: 0.6 mg/dL — CL (ref 1.7–2.4)

## 2021-08-25 MED ORDER — OXYCODONE HCL 5 MG PO TABS
5.0000 mg | ORAL_TABLET | ORAL | Status: DC | PRN
  Administered 2021-08-25 – 2021-08-30 (×20): 10 mg via ORAL
  Filled 2021-08-25 (×20): qty 2

## 2021-08-25 MED ORDER — ENOXAPARIN SODIUM 30 MG/0.3ML IJ SOSY
30.0000 mg | PREFILLED_SYRINGE | INTRAMUSCULAR | Status: DC
  Administered 2021-08-25: 30 mg via SUBCUTANEOUS
  Filled 2021-08-25: qty 0.3

## 2021-08-25 MED ORDER — POTASSIUM CHLORIDE 20 MEQ PO PACK
40.0000 meq | PACK | Freq: Once | ORAL | Status: AC
  Administered 2021-08-25: 40 meq via ORAL
  Filled 2021-08-25: qty 2

## 2021-08-25 MED ORDER — VANCOMYCIN HCL IN DEXTROSE 1-5 GM/200ML-% IV SOLN: 1000.0000 mg | Freq: Once | INTRAVENOUS | Status: DC

## 2021-08-25 MED ORDER — VALACYCLOVIR HCL 500 MG PO TABS
1000.0000 mg | ORAL_TABLET | Freq: Every day | ORAL | Status: DC
  Administered 2021-08-25 – 2021-09-01 (×7): 1000 mg via ORAL
  Filled 2021-08-25 (×8): qty 2

## 2021-08-25 MED ORDER — METRONIDAZOLE 500 MG/100ML IV SOLN
500.0000 mg | Freq: Two times a day (BID) | INTRAVENOUS | Status: DC
  Administered 2021-08-25 – 2021-08-27 (×6): 500 mg via INTRAVENOUS
  Filled 2021-08-25 (×7): qty 100

## 2021-08-25 MED ORDER — ACETAMINOPHEN 325 MG PO TABS: 650.0000 mg | ORAL_TABLET | Freq: Four times a day (QID) | ORAL | Status: DC | PRN

## 2021-08-25 MED ORDER — ONDANSETRON HCL 4 MG/2ML IJ SOLN
4.0000 mg | Freq: Four times a day (QID) | INTRAMUSCULAR | Status: DC | PRN
  Administered 2021-08-25: 4 mg via INTRAVENOUS
  Filled 2021-08-25: qty 2

## 2021-08-25 MED ORDER — MORPHINE SULFATE (PF) 2 MG/ML IV SOLN
2.0000 mg | INTRAVENOUS | Status: DC | PRN
  Administered 2021-08-25 (×2): 2 mg via INTRAVENOUS
  Filled 2021-08-25 (×2): qty 1

## 2021-08-25 MED ORDER — PANTOPRAZOLE SODIUM 40 MG PO TBEC
40.0000 mg | DELAYED_RELEASE_TABLET | Freq: Every day | ORAL | Status: DC
  Administered 2021-08-25 – 2021-09-01 (×8): 40 mg via ORAL
  Filled 2021-08-25 (×8): qty 1

## 2021-08-25 MED ORDER — LOPERAMIDE HCL 2 MG PO CAPS
2.0000 mg | ORAL_CAPSULE | ORAL | Status: DC | PRN
  Administered 2021-08-29: 2 mg via ORAL
  Filled 2021-08-25: qty 1

## 2021-08-25 MED ORDER — MORPHINE SULFATE (PF) 4 MG/ML IV SOLN
4.0000 mg | Freq: Once | INTRAVENOUS | Status: AC
  Administered 2021-08-25: 4 mg via INTRAVENOUS
  Filled 2021-08-25: qty 1

## 2021-08-25 MED ORDER — VANCOMYCIN HCL 500 MG/100ML IV SOLN: 500.0000 mg | INTRAVENOUS | Status: DC

## 2021-08-25 MED ORDER — ALUM & MAG HYDROXIDE-SIMETH 200-200-20 MG/5ML PO SUSP
30.0000 mL | Freq: Four times a day (QID) | ORAL | Status: DC | PRN
Start: 2021-08-25 — End: 2021-09-01
  Administered 2021-08-25 – 2021-08-27 (×2): 30 mL via ORAL
  Filled 2021-08-25 (×2): qty 30

## 2021-08-25 MED ORDER — ONDANSETRON HCL 4 MG PO TABS: 4.0000 mg | ORAL_TABLET | Freq: Four times a day (QID) | ORAL | Status: DC | PRN

## 2021-08-25 MED ORDER — ACETAMINOPHEN 650 MG RE SUPP: 650.0000 mg | Freq: Four times a day (QID) | RECTAL | Status: DC | PRN

## 2021-08-25 MED ORDER — VANCOMYCIN HCL 1500 MG/300ML IV SOLN
1500.0000 mg | Freq: Once | INTRAVENOUS | Status: AC
  Administered 2021-08-25: 1500 mg via INTRAVENOUS
  Filled 2021-08-25: qty 300

## 2021-08-25 MED ORDER — MAGNESIUM HYDROXIDE 400 MG/5ML PO SUSP: 30.0000 mL | Freq: Every day | ORAL | Status: DC | PRN

## 2021-08-25 MED ORDER — MAGNESIUM SULFATE 4 GM/100ML IV SOLN
4.0000 g | Freq: Once | INTRAVENOUS | Status: AC
  Administered 2021-08-25: 4 g via INTRAVENOUS
  Filled 2021-08-25: qty 100

## 2021-08-25 MED ORDER — MORPHINE SULFATE (PF) 2 MG/ML IV SOLN
2.0000 mg | INTRAVENOUS | Status: DC | PRN
  Administered 2021-08-27 – 2021-08-31 (×9): 2 mg via INTRAVENOUS
  Filled 2021-08-25 (×10): qty 1

## 2021-08-25 MED ORDER — LIDOCAINE 5 % EX PTCH
2.0000 | MEDICATED_PATCH | CUTANEOUS | Status: DC
Start: 2021-08-25 — End: 2021-09-01
  Administered 2021-08-25 – 2021-08-31 (×6): 2 via TRANSDERMAL
  Filled 2021-08-25 (×6): qty 2

## 2021-08-25 MED ORDER — ACETAMINOPHEN 500 MG PO TABS
1000.0000 mg | ORAL_TABLET | Freq: Three times a day (TID) | ORAL | Status: DC
  Administered 2021-08-25 – 2021-08-30 (×15): 1000 mg via ORAL
  Filled 2021-08-25 (×15): qty 2

## 2021-08-25 MED ORDER — SODIUM CHLORIDE 0.9 % IV SOLN
2.0000 g | INTRAVENOUS | Status: DC
  Administered 2021-08-25 – 2021-08-27 (×3): 2 g via INTRAVENOUS
  Filled 2021-08-25 (×3): qty 2

## 2021-08-25 MED ORDER — SERTRALINE HCL 50 MG PO TABS
100.0000 mg | ORAL_TABLET | Freq: Every day | ORAL | Status: DC
  Administered 2021-08-25 – 2021-09-01 (×8): 100 mg via ORAL
  Filled 2021-08-25 (×8): qty 2

## 2021-08-25 MED ORDER — TRAZODONE HCL 50 MG PO TABS
25.0000 mg | ORAL_TABLET | Freq: Every evening | ORAL | Status: DC | PRN
  Administered 2021-08-26 – 2021-08-28 (×4): 25 mg via ORAL
  Filled 2021-08-25 (×5): qty 1

## 2021-08-25 MED ORDER — SODIUM CHLORIDE 0.9 % IV SOLN: 2.0000 g | Freq: Once | INTRAVENOUS | Status: DC

## 2021-08-25 MED ORDER — BUPROPION HCL 75 MG PO TABS
75.0000 mg | ORAL_TABLET | Freq: Three times a day (TID) | ORAL | Status: DC
  Administered 2021-08-25 – 2021-09-01 (×20): 75 mg via ORAL
  Filled 2021-08-25 (×25): qty 1

## 2021-08-25 MED ORDER — POTASSIUM CHLORIDE IN NACL 40-0.9 MEQ/L-% IV SOLN
INTRAVENOUS | Status: DC
  Filled 2021-08-25 (×7): qty 1000

## 2021-08-25 MED ORDER — SODIUM CHLORIDE 0.9 % IV SOLN: 2.0000 g | INTRAVENOUS | Status: DC

## 2021-08-25 MED ORDER — ROSUVASTATIN CALCIUM 20 MG PO TABS
40.0000 mg | ORAL_TABLET | Freq: Every day | ORAL | Status: DC
  Administered 2021-08-25 – 2021-09-01 (×8): 40 mg via ORAL
  Filled 2021-08-25 (×8): qty 2

## 2021-08-25 MED ORDER — ACETAMINOPHEN 650 MG RE SUPP: 650.0000 mg | Freq: Three times a day (TID) | RECTAL | Status: DC

## 2021-08-25 NOTE — ED Notes (Signed)
Pts O2 sats 88% on RA.  2L via [place] applied to pt at this time.

## 2021-08-25 NOTE — Assessment & Plan Note (Signed)
Continue statin. 

## 2021-08-25 NOTE — Consult Note (Signed)
Neurosurgery-New Consultation Evaluation 08/25/2021 Shirley Evans [id number]  Identifying Statement: Shirley Evans is a 56 y.o. female from [place] [id number] with history of chronic nausea and vomiting, alcohol abuse, hypertension, COPD  Physician Requesting Consultation: No ref. provider found  History of Present Illness: Shirley Evans is a 56 y.o presenting to the ER on 08/24/2021 with intractable nausea and vomiting and sharp pain in both sides of her upper abdomen and middle back.  She states that this started without any particular inciting event and she denies any known trauma to her spine.  She underwent nominal CT scan which revealed multiple compression deformities.  She has remained in the hospital for continued symptoms.  She denies any radiating leg pain or lower extremity weakness.  Past Medical History:  Past Medical History:  Diagnosis Date   Cancer ([place])    cervical   COPD (chronic obstructive pulmonary disease) (HCC)    GERD (gastroesophageal reflux disease)    History of Helicobacter pylori infection    Hypertension    Menopausal state    Osteopenia    Postmenopausal atrophic vaginitis     Social History: Social History   Socioeconomic History   Marital status: Single    Spouse name: Not on file   Number of children: Not on file   Years of education: Not on file   Highest education level: Not on file  Occupational History   Not on file  Tobacco Use   Smoking status: Former    Packs/day: 1.00    Years: 30.00    Pack years: 30.00    Types: Cigarettes   Smokeless tobacco: Never  Vaping Use   Vaping Use: Never used  Substance and Sexual Activity   Alcohol use: Yes    Alcohol/week: 0.0 standard drinks   Drug use: Yes    Types: Marijuana    Comment: occasion recreational use   Sexual activity: Not on file  Other Topics Concern   Not on file  Social History Narrative   Not on file   Social Determinants of Health   Financial Resource  Strain: Not on file  Food Insecurity: Not on file  Transportation Needs: Not on file  Physical Activity: Not on file  Stress: Not on file  Social Connections: Not on file  Intimate Partner Violence: Not on file     Family History: Family History  Problem Relation Age of Onset   Diabetes Mother    Hypertension Mother    Breast cancer Paternal Aunt 56   Hypertension Father     Review of Systems:  Review of Systems - General ROS: Negative Psychological ROS: Negative Ophthalmic ROS: Negative ENT ROS: Negative Hematological and Lymphatic ROS: Negative  Endocrine ROS: Negative Respiratory ROS: Negative Cardiovascular ROS: Negative Gastrointestinal ROS: Negative Genito-Urinary ROS: Negative Musculoskeletal ROS: Negative Neurological ROS: Negative Dermatological ROS: Negative  Physical Exam: BP 122/85 (BP Location: Right Arm)   Pulse 75   Temp 98.1 F (36.7 C)   Resp (!) 22   Ht '5\' 4"'$  (1.626 m)   Wt 65.8 kg   SpO2 92%   BMI 24.89 kg/m  Body mass index is 24.89 kg/m. Body surface area is 1.72 meters squared. General appearance: Alert, cooperative, in no acute distress Head: Normocephalic, atraumatic Eyes: Normal, EOM intact Oropharynx: Moist without lesions Neck: Supple, no tenderness Heart: Normal, regular rate and rhythm, without murmur Lungs: Clear to auscultation, good air exchange Abdomen: Soft, nondistended Ext: No edema in LE bilaterally, good distal pulses  Neurologic exam:  Mental status: alertness: alert, orientation: person, place, time, affect: normal Speech: fluent and clear Cranial nerves:  II-XII grossly inact Motor:strength symmetric 5/5, normal muscle mass and tone in all extremities and no pronator drift Sensory: intact to light touch in all extremities Reflexes: 2+ and symmetric bilaterally for arms and legs Coordination: intact finger to nose Gait: Patient ambulates with assistance of a rolling walker to the bathroom  Laboratory: Results  for orders placed or performed during the hospital encounter of 08/24/21  Culture, blood (routine x 2)   Specimen: BLOOD  Result Value Ref Range   Specimen Description BLOOD RIGHT HAND    Special Requests      BOTTLES DRAWN AEROBIC AND ANAEROBIC Blood Culture adequate volume   Culture      NO GROWTH < 12 HOURS Performed at [facility], [address] [place], [place] [address]    Report Status PENDING   Culture, blood (routine x 2)   Specimen: BLOOD  Result Value Ref Range   Specimen Description BLOOD LEFT ARM    Special Requests      BOTTLES DRAWN AEROBIC AND ANAEROBIC Blood Culture adequate volume   Culture      NO GROWTH < 12 HOURS Performed at [facility], [address] [place], [place] [address]    Report Status PENDING   Lipase, blood  Result Value Ref Range   Lipase 27 11 - 51 U/L  Comprehensive metabolic panel  Result Value Ref Range   Sodium 130 (L) 135 - 145 mmol/L   Potassium 2.7 (LL) 3.5 - 5.1 mmol/L   Chloride 82 (L) 98 - 111 mmol/L   CO2 24 22 - 32 mmol/L   Glucose, Bld 205 (H) 70 - 99 mg/dL   BUN 32 (H) 6 - 20 mg/dL   Creatinine, Ser 2.49 (H) 0.44 - 1.00 mg/dL   Calcium 9.6 8.9 - 10.3 mg/dL   Total Protein 9.0 (H) 6.5 - 8.1 g/dL   Albumin 4.7 3.5 - 5.0 g/dL   AST 113 (H) 15 - 41 U/L   ALT 75 (H) 0 - 44 U/L   Alkaline Phosphatase 108 38 - 126 U/L   Total Bilirubin 3.5 (H) 0.3 - 1.2 mg/dL   GFR, Estimated 22 (L) >60 mL/min   Anion gap 24 (H) 5 - 15  CBC  Result Value Ref Range   WBC 29.8 (H) 4.0 - 10.5 K/uL   RBC 5.34 (H) 3.87 - 5.11 MIL/uL   Hemoglobin 18.2 (H) 12.0 - 15.0 g/dL   HCT 50.5 (H) 36.0 - 46.0 %   MCV 94.6 80.0 - 100.0 fL   MCH 34.1 (H) 26.0 - 34.0 pg   MCHC 36.0 30.0 - 36.0 g/dL   RDW 14.5 11.5 - 15.5 %   Platelets 341 150 - 400 K/uL   nRBC 0.0 0.0 - 0.2 %  Urinalysis, Routine w reflex microscopic  Result Value Ref Range   Color, Urine AMBER (A) YELLOW   APPearance CLOUDY (A) CLEAR   Specific Gravity, Urine  1.021 1.005 - 1.030   pH 5.0 5.0 - 8.0   Glucose, UA 50 (A) NEGATIVE mg/dL   Hgb urine dipstick LARGE (A) NEGATIVE   Bilirubin Urine NEGATIVE NEGATIVE   Ketones, ur 5 (A) NEGATIVE mg/dL   Protein, ur 100 (A) NEGATIVE mg/dL   Nitrite NEGATIVE NEGATIVE   Leukocytes,Ua NEGATIVE NEGATIVE   RBC / HPF 0-5 0 - 5 RBC/hpf   WBC, UA 6-10 0 -  5 WBC/hpf   Bacteria, UA NONE SEEN NONE SEEN   Squamous Epithelial / LPF 6-10 0 - 5   Mucus PRESENT    Hyaline Casts, UA PRESENT    Amorphous Crystal PRESENT   Magnesium  Result Value Ref Range   Magnesium 0.6 (LL) 1.7 - 2.4 mg/dL  Lactic acid, plasma  Result Value Ref Range   Lactic Acid, Venous 3.1 (HH) 0.5 - 1.9 mmol/L  Lactic acid, plasma  Result Value Ref Range   Lactic Acid, Venous 2.5 (HH) 0.5 - 1.9 mmol/L  Procalcitonin - Baseline  Result Value Ref Range   Procalcitonin 0.27 ng/mL  Protime-INR  Result Value Ref Range   Prothrombin Time 14.0 11.4 - 15.2 seconds   INR 1.1 0.8 - 1.2  Ethanol  Result Value Ref Range   Alcohol, Ethyl (B) <10 <10 mg/dL  Protime-INR  Result Value Ref Range   Prothrombin Time 14.3 11.4 - 15.2 seconds   INR 1.1 0.8 - 1.2  Cortisol-am, blood  Result Value Ref Range   Cortisol - AM 74.4 (H) 6.7 - 22.6 ug/dL  Procalcitonin  Result Value Ref Range   Procalcitonin 0.19 ng/mL  Basic metabolic panel  Result Value Ref Range   Sodium 131 (L) 135 - 145 mmol/L   Potassium 2.9 (L) 3.5 - 5.1 mmol/L   Chloride 88 (L) 98 - 111 mmol/L   CO2 27 22 - 32 mmol/L   Glucose, Bld 138 (H) 70 - 99 mg/dL   BUN 34 (H) 6 - 20 mg/dL   Creatinine, Ser 2.16 (H) 0.44 - 1.00 mg/dL   Calcium 8.1 (L) 8.9 - 10.3 mg/dL   GFR, Estimated 26 (L) >60 mL/min   Anion gap 16 (H) 5 - 15  CBC  Result Value Ref Range   WBC 20.0 (H) 4.0 - 10.5 K/uL   RBC 4.82 3.87 - 5.11 MIL/uL   Hemoglobin 16.7 (H) 12.0 - 15.0 g/dL   HCT 46.5 (H) 36.0 - 46.0 %   MCV 96.5 80.0 - 100.0 fL   MCH 34.6 (H) 26.0 - 34.0 pg   MCHC 35.9 30.0 - 36.0 g/dL   RDW  14.5 11.5 - 15.5 %   Platelets 197 150 - 400 K/uL   nRBC 0.0 0.0 - 0.2 %  Lactic acid, plasma  Result Value Ref Range   Lactic Acid, Venous 0.9 0.5 - 1.9 mmol/L  Troponin I (High Sensitivity)  Result Value Ref Range   Troponin I (High Sensitivity) 30 (H) <18 ng/L   I personally reviewed labs  Imaging:  08/24/21 CT abdomen and pelvis IMPRESSION: 1. Acute fracture deformities involving the T10, T11, T12 and L3 vertebral bodies. MRI correlation is recommended. 2. Cholelithiasis. 3. Small hiatal hernia. 4. 7 mm nonobstructing left renal calculus. 5. Hepatic steatosis without focal liver lesions. 6. 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   Aortic Atherosclerosis (ICD10-I70.0).     Electronically Signed   By: Virgina Norfolk M.D.   On: 08/25/2021 00:14 Impression/Plan:     1.  Diagnosis: Multiple compression deformities.  2.  Plan  -Recommend upright thoracic x-rays in brace.  I will place the order for this. -Continue TLSO brace when out of bed -Continue remainder of care per internal medicine recommendations -No plan for neurosurgical intervention at this time. -Follow-up outpatient in 3 weeks with xrays  - Please call with any questions or concerns.   Cooper Render PA-C Neurosurgery

## 2021-08-25 NOTE — Assessment & Plan Note (Addendum)
Neurosurgery was consulted.  No intervention is recommended, conservative management only. --TLSO brace when out of bed -patient noncompliant with this --Pain control -scheduled Tylenol, lidocaine patches, as needed oxycodone, IV morphine for breakthrough --Neurosurgery follow-up outpatient in 3 weeks -- PT and OT evaluations -SNF recommended, patient declines.  Home with home health PT OT --Repeat x-rays of thoracic and lumbar spine today are unchanged.  Obtained because patient "heard something pop" in her back and continues to report uncontrolled pain

## 2021-08-25 NOTE — Progress Notes (Addendum)
Same day as admission rounding note  56 year old female with past medical history of COPD, GERD, hypertension, alcohol abuse with chronic nausea vomiting likely due to gastritis he presented to the ED overnight and was admitted earlier this morning for further evaluation and management of nausea vomiting, mild diarrhea and significant back pain.  She was found to have acute compression fracture deformities involving T10, T11, T12 and L3.  Please see H&P for full assessment and plan, with any changes or additions as outlined below:  Neurosurgery was consulted.  No intervention is recommended, conservative management only. --TLSO brace when out of bed --Pain control -scheduled Tylenol, lidocaine patches, as needed oxycodone, IV morphine for breakthrough --Neurosurgery follow-up outpatient in 3 weeks --Follow-up pending upright thoracic x-ray in brace -- PT and OT evaluations  Gastroenteritis -admitted on broad-spectrum antibiotics vancomycin, cefepime and Flagyl. Very unlikely any MRSA.  Unlikely need pseudomonal coverage. --De-escalate to Rocephin, continue Flagyl  Hypomagnesemia -severe, Mg 0.6 --Replace with 4 g IV mag sulfate --Repeat Mg level and replace further as needed  Hypokalemia -K2.7 >> 2.9 --On IV fluids with KCl additive --Repeat BMP and further replacement as needed   Subjective: Patient awake laying in bed, writhing in pain, wearing TLSO brace when seen on rounds.  She reports not tingling nausea vomiting this morning is having significant back pain and unable to get comfortable.  She has no other acute complaints at this time.  Exam:, In moderate distress due to pain Respiratory lungs clear bilaterally no wheezes rhonchi normal respiratory effort Cardiovascular regular rate and rhythm no peripheral edema GI abdomen soft nontender nondistended with bowel sounds present Neuro moves all extremities grossly nonfocal exam normal speech, alert and oriented x3 Skin dry intact  with no rashes seen on visualized skin Psych anxious mood, congruent affect, judgment and insight appear normal   No charge

## 2021-08-25 NOTE — Progress Notes (Signed)
PHARMACIST - PHYSICIAN COMMUNICATION  CONCERNING:  Enoxaparin (Lovenox) for DVT Prophylaxis    RECOMMENDATION: Patient was prescribed enoxaprin '40mg'$  q24 hours for VTE prophylaxis.   Filed Weights   08/24/21 1928  Weight: 65.8 kg (145 lb)    Body mass index is 24.89 kg/m.  Estimated Creatinine Clearance: 23.3 mL/min (A) (by C-G formula based on SCr of 2.49 mg/dL (H)).  Patient is candidate for enoxaparin '30mg'$  every 24 hours based on CrCl <73m/min or Weight <45kg  DESCRIPTION: [facility]  Patient is now receiving enoxaparin 30 mg every 24 hours   NRenda Rolls PharmD, [facility]6/04/2021 2:49 AM

## 2021-08-25 NOTE — Progress Notes (Signed)
CODE SEPSIS - PHARMACY COMMUNICATION  **Broad Spectrum Antibiotics should be administered within 1 hour of Sepsis diagnosis**  Time Code Sepsis Called/Page Received: 2347  Antibiotics Ordered: Cefepime, Flagyl, Vancomycin  Time of 1st antibiotic administration: 0038  Renda Rolls, PharmD, MBA 08/25/2021 2:09 AM

## 2021-08-25 NOTE — Progress Notes (Signed)
PHARMACY -  BRIEF ANTIBIOTIC NOTE   Pharmacy has received consult(s) for Cefepime and Vancomycin from an ED provider.  The patient's profile has been reviewed for ht/wt/allergies/indication/available labs.    One time order(s) placed for Cefepime 2 gm and Vancomycin 1500 mg per pt wt: 65.8 kg  Further antibiotics/pharmacy consults should be ordered by admitting physician if indicated.                       Thank you, Genavive Kubicki,Natha S 08/25/2021  12:07 AM

## 2021-08-25 NOTE — Assessment & Plan Note (Addendum)
Likely prerenal azotemia in the setting of nausea vomiting diarrhea.  Creatinine on admission was 2.49, up from 0.68 on 5/20. AKI resolved with IV hydration. Cr: 0/77 --Stop IV fluids --Monitor BMP

## 2021-08-25 NOTE — Progress Notes (Signed)
Sepsis tracking by eLINK 

## 2021-08-25 NOTE — Progress Notes (Signed)
Orthopedic Tech Progress Note Patient Details:  Shirley Evans [birth year] [id number]  Ortho Devices Type of Ortho Device: Thoracolumbar corset (TLSO) Ortho Device/Splint Location: back Ortho Device/Splint Interventions: Ordered  Brace ordered.    Manar Smalling L Waynesha Rammel 08/25/2021, 3:09 AM

## 2021-08-25 NOTE — Assessment & Plan Note (Addendum)
Unclear if infectious etiology or acutely flared gastritis related to her alcohol abuse. See sepsis for plan

## 2021-08-25 NOTE — Assessment & Plan Note (Addendum)
Potassium was replaced on admission. K today 4.8 -- Monitor BMP, replace K as needed

## 2021-08-25 NOTE — Assessment & Plan Note (Signed)
-   Continue Zoloft and Wellbutrin ?

## 2021-08-25 NOTE — ED Notes (Signed)
Pt's mother's number [phone] Vickii Chafe) Pt's father's cell number [phone]

## 2021-08-25 NOTE — Progress Notes (Signed)
Pharmacy Antibiotic Note  Shirley Evans is a 56 y.o. female admitted on 08/24/2021 with sepsis from unknown source.  Pharmacy has been consulted for Cefepime and Vancomycin dosing for 7 days.  Plan: Cefepime 2 gm q24h per indication & renal fxn.  Pt given initial dose of Vancomycin 1500 mg in ED. Vancomycin 500 mg IV Q 24 hrs. Goal AUC 400-550. Expected AUC: 474 SCr used: 2.49  Pharmacy will continue to follow and will adjust abx as warranted.  Height: '5\' 4"'$  (162.6 cm) Weight: 65.8 kg (145 lb) IBW/kg (Calculated) : 54.7  Temp (24hrs), Avg:97.6 F (36.4 C), Min:97.6 F (36.4 C), Max:97.6 F (36.4 C)  Recent Labs  Lab 08/24/21 1933 08/25/21 0055  WBC 29.8*  --   CREATININE 2.49*  --   LATICACIDVEN  --  3.1*    Estimated Creatinine Clearance: 23.3 mL/min (A) (by C-G formula based on SCr of 2.49 mg/dL (H)).    Allergies  Allergen Reactions   Demerol [Meperidine] Nausea Only and Nausea And Vomiting   Codeine Nausea Only    Antimicrobials this admission: 6/2 Cefepime >> x 7 days 6/2 Vancomycin >> x 7 days 6/2 Flagyl >> x 7 days  Microbiology results: 6/02 BCx: Pending  Thank you for allowing pharmacy to be a part of this patient's care.  Renda Rolls, PharmD, MBA 08/25/2021 2:25 AM

## 2021-08-25 NOTE — H&P (Addendum)
[place]   PATIENT NAME: Shirley Evans    MR#:  [id number]  DATE OF BIRTH:  07-Jun-1965  DATE OF ADMISSION:  08/24/2021  PRIMARY CARE PHYSICIAN: Romualdo Bolk, FNP   Patient is coming from: Home  REQUESTING/REFERRING PHYSICIAN: Marianna Fuss, MD  CHIEF COMPLAINT:   Chief Complaint  Patient presents with   Abdominal Pain    HISTORY OF PRESENT ILLNESS:  Shirley Evans is a 56 y.o. Caucasian female with medical history significant for COPD, GERD, hypertension, alcohol abuse and chronic nausea and vomiting, who presented to the emergency room with onset of recurrent nausea and vomiting since Monday with associated mild diarrhea.  She admits to tactile fever and chills.  She has been having low back pain but denies any significant abdominal pain.  No dysuria, oliguria or hematuria or flank pain.  She had an admission here on 5/18 for similar symptoms.  Her last alcohol drink was on Saturday.  She is not sure if she had recent fall  ED Course: When she came to the ER, BP was 170/133 with heart rate of 124 respiratory to 28 and otherwise normal temperature and pulse oximetry.  Labs revealed hyponatremia of 130 and hypo-chloremia of 82 with hypokalemia of 2.7, blood glucose of 205, BUN of 32 and creatinine 2.49 compared to 0.68 on 5/20.  AST was 113 better than before and ALT 75 similar to before with total bili of 3.5 that is up from 1.5 on 08/12/2021 and 2 obtained of 9 from 5.4.  CBC shows significant acceptance of 29.5 up from 5.6 and hemoconcentration.  INR is 1.1 with PT 14 and UA came back with 6-10 WBCs with 0-5 RBCs and no bacteria, 100 protein, 5 ketones and 50 glucose. EKG as reviewed by me : EKG showed sinus tachycardia with a rate of 103 with poor rate progression and mild lateral ST segment depression Imaging: Two-view chest ray showed no acute cardiopulmonary disease.  Abdominal pelvic CT scan revealed the following: 1. Acute fracture deformities involving the  T10, T11, T12 and L3 vertebral bodies. MRI correlation is recommended. 2. Cholelithiasis. 3. Small hiatal hernia. 4. 7 mm nonobstructing left renal calculus. 5. Hepatic steatosis without focal liver lesions. 6. 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  The patient was given 4 milligram of IV morphine sulfate and 4 mg of IV Zofran, 1 L bolus of IV normal saline, IV vancomycin, cefepime and Flagyl.She will be admitted to a medical bed for further evaluation and management   PAST MEDICAL HISTORY:   Past Medical History:  Diagnosis Date   Cancer ([place])    cervical   COPD (chronic obstructive pulmonary disease) (HCC)    GERD (gastroesophageal reflux disease)    History of Helicobacter pylori infection    Hypertension    Menopausal state    Osteopenia    Postmenopausal atrophic vaginitis     PAST SURGICAL HISTORY:   Past Surgical History:  Procedure Laterality Date   BREAST BIOPSY Bilateral    neg   TOTAL HIP ARTHROPLASTY Left 12/22/2020   Procedure: TOTAL HIP ARTHROPLASTY ANTERIOR APPROACH;  Surgeon: Hessie Knows, MD;  Location: [facility] ORS;  Service: Orthopedics;  Laterality: Left;    SOCIAL HISTORY:   Social History   Tobacco Use   Smoking status: Former    Packs/day: 1.00    Years: 30.00    Pack years: 30.00    Types: Cigarettes  Smokeless tobacco: Never  Substance Use Topics   Alcohol use: Yes    Alcohol/week: 0.0 standard drinks    FAMILY HISTORY:   Family History  Problem Relation Age of Onset   Diabetes Mother    Hypertension Mother    Breast cancer Paternal Aunt 29   Hypertension Father     DRUG ALLERGIES:   Allergies  Allergen Reactions   Demerol [Meperidine] Nausea Only and Nausea And Vomiting   Codeine Nausea Only    REVIEW OF SYSTEMS:   ROS As per history of present illness. All pertinent systems were reviewed above. Constitutional, HEENT, cardiovascular, respiratory, GI, GU,  musculoskeletal, neuro, psychiatric, endocrine, integumentary and hematologic systems were reviewed and are otherwise negative/unremarkable except for positive findings mentioned above in the HPI.   MEDICATIONS AT HOME:   Prior to Admission medications   Medication Sig Start Date End Date Taking? Authorizing Provider  albuterol (VENTOLIN HFA) 108 (90 Base) MCG/ACT inhaler Inhale into the lungs. 07/09/19  Yes [provider]  buPROPion (WELLBUTRIN) 75 MG tablet Take 75 mg by mouth 3 (three) times daily. 06/19/19 08/24/21 Yes [provider]  cholecalciferol (VITAMIN D3) 25 MCG (1000 UNIT) tablet Take 1,000 Units by mouth daily.   Yes [provider]  cyclobenzaprine (FLEXERIL) 10 MG tablet Take 10 mg by mouth daily. 06/19/19  Yes [provider]  gabapentin (NEURONTIN) 300 MG capsule Take 1 capsule by mouth in the morning and at bedtime. 11/08/20  Yes [provider]  lidocaine (LIDODERM) 5 % Place onto the skin. 04/01/19  Yes [provider]  lisinopril-hydrochlorothiazide (ZESTORETIC) 20-12.5 MG tablet Take 1 tablet by mouth daily. 08/12/21  Yes Annita Brod, MD  loperamide (IMODIUM) 2 MG capsule Take 1 capsule (2 mg total) by mouth as needed for diarrhea or loose stools. 08/12/21  Yes Annita Brod, MD  omeprazole (PRILOSEC) 20 MG capsule Take 1 capsule (20 mg total) by mouth 2 (two) times daily before a meal. 08/12/21 08/07/22 Yes Annita Brod, MD  rosuvastatin (CRESTOR) 40 MG tablet Take 40 mg by mouth daily. 06/19/19 08/24/21 Yes [provider]  sertraline (ZOLOFT) 100 MG tablet Take 100 mg by mouth daily. 06/19/19  Yes [provider]  valACYclovir (VALTREX) 1000 MG tablet Take 1,000 mg by mouth daily. 11/08/20  Yes [provider]  albuterol (PROVENTIL) (2.5 MG/3ML) 0.083% nebulizer solution Inhale into the lungs. 07/09/19 08/11/21  [provider]  enoxaparin (LOVENOX) 40 MG/0.4ML injection Inject 0.4  mLs (40 mg total) into the skin daily for 14 days. 12/25/20 01/08/21  Duanne Guess, PA-C      VITAL SIGNS:  Blood pressure (!) 164/110, pulse 86, temperature 97.8 F (36.6 C), resp. rate (!) 22, height '5\' 4"'$  (1.626 m), weight 65.8 kg, SpO2 100 %.  PHYSICAL EXAMINATION:  Physical Exam  GENERAL:  56 y.o.-year-old Caucasian female patient lying in the bed with no acute distress.  EYES: Pupils equal, round, reactive to light and accommodation. No scleral icterus. Extraocular muscles intact.  HEENT: Head atraumatic, normocephalic. Oropharynx with slightly dry mucous membranes and tongue and nasopharynx clear.  NECK:  Supple, no jugular venous distention. No thyroid enlargement, no tenderness.  LUNGS: Normal breath sounds bilaterally, no wheezing, rales,rhonchi or crepitation. No use of accessory muscles of respiration.  CARDIOVASCULAR: Regular rate and rhythm, S1, S2 normal. No murmurs, rubs, or gallops.  ABDOMEN: Soft, nondistended, nontender. Bowel sounds present. No organomegaly or mass.  EXTREMITIES: No pedal edema, cyanosis, or clubbing.  NEUROLOGIC: Cranial nerves II through XII are intact. Muscle strength 5/5 in all extremities. Sensation intact. Gait not checked. Musculoskeletal: Mid and low back tenderness PSYCHIATRIC: The patient is alert and oriented x 3.  Normal affect and good eye contact. SKIN: No obvious rash, lesion, or ulcer.   LABORATORY PANEL:   CBC Recent Labs  Lab 08/25/21 0324  WBC 20.0*  HGB 16.7*  HCT 46.5*  PLT 197   ------------------------------------------------------------------------------------------------------------------  Chemistries  Recent Labs  Lab 08/24/21 1933 08/25/21 0055 08/25/21 0324  NA 130*  --  131*  K 2.7*  --  2.9*  CL 82*  --  88*  CO2 24  --  27  GLUCOSE 205*  --  138*  BUN 32*  --  34*  CREATININE 2.49*  --  2.16*  CALCIUM 9.6  --  8.1*  MG  --  0.6*  --   AST 113*  --   --   ALT 75*  --   --   ALKPHOS 108  --   --    BILITOT 3.5*  --   --    ------------------------------------------------------------------------------------------------------------------  Cardiac Enzymes No results for input(s): TROPONINI in the last 168 hours. ------------------------------------------------------------------------------------------------------------------  RADIOLOGY:  CT Abdomen Pelvis Wo Contrast  Result Date: 08/25/2021 CLINICAL DATA:  Abdominal pain and vomiting x2 weeks. EXAM: CT ABDOMEN AND PELVIS WITHOUT CONTRAST TECHNIQUE: Multidetector CT imaging of the abdomen and pelvis was performed following the standard protocol without IV contrast. RADIATION DOSE REDUCTION: This exam was performed according to the departmental dose-optimization program which includes automated exposure control, adjustment of the mA and/or kV according to patient size and/or use of iterative reconstruction technique. COMPARISON:  Aug 10, 2021 FINDINGS: Lower chest: No acute abnormality. Hepatobiliary: There is diffuse fatty infiltration of the liver parenchyma. No focal liver abnormality is seen. Subcentimeter gallstones are seen within the gallbladder lumen without evidence of gallbladder wall thickening or biliary dilatation. Pancreas: Unremarkable. No pancreatic ductal dilatation or surrounding inflammatory changes. Spleen: Normal in size without focal abnormality. Adrenals/Urinary Tract: Adrenal glands are unremarkable. Kidneys are mildly enlarged and lobulated in appearance, without obstructing renal calculi, focal lesion, or hydronephrosis. A 7 mm nonobstructing renal calculus is seen within the anterior aspect of the mid left kidney. The urinary bladder appears to be unremarkable and is limited in evaluation secondary to overlying streak artifact. Stomach/Bowel: There is a small hiatal hernia. Appendix appears normal. No evidence of bowel wall thickening, distention, or inflammatory changes. Vascular/Lymphatic: Aortic atherosclerosis. No  enlarged abdominal or pelvic lymph nodes. Reproductive: Uterus and bilateral adnexa are limited in evaluation secondary to the presence of overlying streak artifact. Other: No abdominal wall hernia or abnormality. No abdominopelvic ascites. Musculoskeletal: A left hip replacement is seen with associated streak artifact and subsequently limited evaluation of the adjacent osseous and soft tissue structures. There is a chronic fracture of the eleventh left rib. Acute fracture deformities are seen involving the T10, T11 and T12 vertebral bodies. An additional fracture of the superior endplate of the L3 vertebral body is seen. These are new when compared to prior study. Marked severity chronic and degenerative changes are seen at the level of L5-S1. IMPRESSION: 1. Acute fracture deformities involving the T10, T11, T12 and L3 vertebral bodies. MRI correlation is recommended. 2. Cholelithiasis. 3. Small hiatal hernia. 4. 7 mm nonobstructing left renal calculus. 5. Hepatic steatosis without focal liver lesions. 6. 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 Aortic Atherosclerosis (  ICD10-I70.0). Electronically Signed   By: Virgina Norfolk M.D.   On: 08/25/2021 00:14   DG Chest 2 View  Result Date: 08/25/2021 CLINICAL DATA:  Weakness EXAM: CHEST - 2 VIEW COMPARISON:  12/21/2020 FINDINGS: Heart and mediastinal contours are within normal limits. No focal opacities or effusions. No acute bony abnormality. IMPRESSION: No active cardiopulmonary disease. Electronically Signed   By: Rolm Baptise M.D.   On: 08/25/2021 00:10      IMPRESSION AND PLAN:  Assessment and Plan: * AKI (acute kidney injury) ([place]) - The patient be admitted to a medical bed. - This likely prerenal due to volume depletion and dehydration from recurrent nausea and vomiting with diarrhea.  It could be partly alcoholic gastritis. - She will be hydrated with IV normal saline. - We will follow  BMP. - Would avoid nephrotoxins.  Sepsis ([place]) - This is manifested by leukocytosis, tachycardia and tachypnea. - The patient will be placed on broad-spectrum antibiotic therapy with IV cefepime, vancomycin and Flagyl pending blood cultures. - Could be related to infectious gastroenteritis. - We will follow lactic acid level. -The patient meets severe sepsis criteria but lactic acid initial level of 3.1.  Vertebral fracture, osteoporotic, initial encounter ([place]) - Pain management will be provided. - Neurosurgery consult will be obtained. - I notified Dr. Izora Ribas about the patient.  Hypokalemia - This is associated with severe hypomagnesemia. - Potassium and magnesium will be replaced.  Acute gastroenteritis - This is likely the culprit for #1. - We will hydrate and place her on as needed antiemetics and antidiarrheals. - PPI therapy will be provided.  Dyslipidemia - We will continue statin therapy.  Depression - We will continue her Zoloft and Wellbutrin.  Hypertension - We will place on as needed IV labetalol.       DVT prophylaxis: Lovenox.  Advanced Care Planning:  Code Status: full code.  Family Communication:  The plan of care was discussed in details with the patient (and family). I answered all questions. The patient agreed to proceed with the above mentioned plan. Further management will depend upon hospital course. Disposition Plan: Back to previous home environment Consults called: none.  All the records are reviewed and case discussed with ED provider.  Status is: Inpatient  At the time of the admission, it appears that the appropriate admission status for this patient is inpatient.  This is judged to be reasonable and necessary in order to provide the required intensity of service to ensure the patient's safety given the presenting symptoms, physical exam findings and initial radiographic and laboratory data in the context of comorbid conditions.  The  patient requires inpatient status due to high intensity of service, high risk of further deterioration and high frequency of surveillance required.  I certify that at the time of admission, it is my clinical judgment that the patient will require inpatient hospital care extending more than 2 midnights.                            Dispo: The patient is from: Home              Anticipated d/c is to: Home              Patient currently is not medically stable to d/c.              Difficult to place patient: No  Christel Mormon M.D on 08/25/2021 at 6:08 AM  [facility]   From 7 PM-7 AM, contact night-coverage [email or web address]  CC: Primary care physician; Romualdo Bolk, FNP

## 2021-08-25 NOTE — Assessment & Plan Note (Addendum)
Increased lisinopril. IV labetalol as needed

## 2021-08-25 NOTE — Assessment & Plan Note (Addendum)
Severe sepsis was present on admission as evidenced by leukocytosis, tachycardia and tachypnea, elevated lactic acid consistent with organ dysfunction in the setting of acute GI illness felt to likely be infectious. Treated with broad-spectrum antibiotics in the ED, continued on cefepime and Flagyl on admission.  Cefepime was de-escalated to Rocephin on 6/2. -- Continue Rocephin and Flagyl -- Follow cultures -no growth to date -- Supportive care with IV hydration, antiemetics, PPI

## 2021-08-25 NOTE — Evaluation (Signed)
Physical Therapy Evaluation Patient Details Name: REINE BRISTOW MRN: [id number] DOB: [birth year] Today's Date: 08/25/2021  History of Present Illness  56 y.o. female admitted to hospital with onset of recurrent nausea and vomiting since Monday with associated mild diarrhea. Medical history significant for COPD, GERD, hypertension, alcohol abuse and chronic nausea and vomiting. Found to have acute compression fracture involving T10, T11, T12 and L3; does not remember a fall.  Clinical Impression  Pt just returned to room from x-ray; wearing TLSO. BLE are restless as subjective information is gathered and at end of session once returned to bed. Pt lives with her boyfriend who is not at home frequently due to work, grocery shopping, caring for dogs and other recreational activities. Pt is alone most of the day; no other family able to care for pt. She does have a history of falling; hip fracture occurring last September due to fall. Upon arrival pt is reporting high pain level; pain meds received ~1 hour prior to session. She performs all mobility with CGA-MIN A requiring UE assist on bed rail and RW. Limited receptiveness to learning how to logroll. Mother agrees pt is not safe to return home provided limited support available. Currently rec SNF due to limited stability on feet and lack of safety pt would experience performing ADLs independently; predict pt will progress quickly as pain becomes controlled and can reassess d/c recs at that time. Would benefit from skilled PT to address above deficits and promote optimal return to PLOF at home.       Recommendations for follow up therapy are one component of a multi-disciplinary discharge planning process, led by the attending physician.  Recommendations may be updated based on patient status, additional functional criteria and insurance authorization.  Follow Up Recommendations Skilled nursing-short term rehab (<3 hours/day)    Assistance Recommended  at Discharge Intermittent Supervision/Assistance  Patient can return home with the following  A little help with walking and/or transfers;A little help with bathing/dressing/bathroom;Assistance with cooking/housework;Assist for transportation;Help with stairs or ramp for entrance    Equipment Recommendations None recommended by PT  Recommendations for Other Services       Functional Status Assessment Patient has had a recent decline in their functional status and demonstrates the ability to make significant improvements in function in a reasonable and predictable amount of time.     Precautions / Restrictions Precautions Precautions: Fall Required Braces or Orthoses: Spinal Brace Spinal Brace: Thoracolumbosacral orthotic;Applied in sitting position;Applied in supine position Restrictions Weight Bearing Restrictions: No Other Position/Activity Restrictions: TLSO when OOB      Mobility  Bed Mobility Overal bed mobility: Needs Assistance Bed Mobility: Rolling, Supine to Sit, Sit to Supine Rolling: Supervision   Supine to sit: Min assist, HOB elevated Sit to supine: Min guard   General bed mobility comments: HHA to pull trunk to upright. Heavy reliance on bed rail for all bed mobility. Does not allow PT to teach logroll for optimal comfort during sup<>sit.    Transfers Overall transfer level: Needs assistance Equipment used: Rolling walker (2 wheels) Transfers: Sit to/from Stand Sit to Stand: Min guard, From elevated surface           General transfer comment: CGA to steady from elevated surface. Lack of eccentric control; does not follow PT advice to reach back with hand prior to sitting.    Ambulation/Gait Ambulation/Gait assistance: Min guard Gait Distance (Feet): 10 Feet Assistive device: Rolling walker (2 wheels) Gait Pattern/deviations: Step-through pattern, Trunk flexed, Wide base of  support, Decreased stride length Gait velocity: decreased     General Gait  Details: Heavy reliance on RW; difficulty with lifting RW for 180d turn at Archivist    Modified Rankin (Stroke Patients Only)       Balance Overall balance assessment: Needs assistance Sitting-balance support: Feet supported, No upper extremity supported Sitting balance-Leahy Scale: Fair Sitting balance - Comments: no LOB   Standing balance support: Bilateral upper extremity supported, During functional activity, Reliant on assistive device for balance Standing balance-Leahy Scale: Poor Standing balance comment: weakness and trembling of BLE with standing and walking; reliant on RW                             Pertinent Vitals/Pain Pain Assessment Pain Assessment: 0-10 Pain Score: 10-Worst pain ever (reports 10,000 - did recieve pain meds 1 hour ago) Pain Location: spine Pain Descriptors / Indicators: Sharp, Aching, Constant, Grimacing, Restless Pain Intervention(s): Limited activity within patient's tolerance, Monitored during session, Premedicated before session, Repositioned    Home Living Family/patient expects to be discharged to:: Private residence Living Arrangements: Spouse/significant other Available Help at Discharge: Family;Available PRN/intermittently (boyfriend is out of home most of the day and works 2nd/3rd shift) Type of Home: House Home Access: Stairs to enter Entrance Stairs-Rails: Left Entrance Stairs-Number of Steps: 6 (front) and 2 (back)   Home Layout: One level Home Equipment: Conservation officer, nature (2 wheels);Cane - single point Additional Comments: Does not use AD at baseline.    Prior Function Prior Level of Function : Needs assist       Physical Assist : ADLs (physical)   ADLs (physical): IADLs Mobility Comments: Independent with household mobility; recalls 1 fall over the last 6 months and another last september leading to hip fx and THA. Rarely leaves home due to SOB associated with  COPD. ADLs Comments: Able to dress and shower independently; does not cook, clean or go grocery shopping.     Hand Dominance        Extremity/Trunk Assessment   Upper Extremity Assessment Upper Extremity Assessment: Generalized weakness    Lower Extremity Assessment Lower Extremity Assessment: Generalized weakness (3+ to 4/5 BLE, right weaker than left)    Cervical / Trunk Assessment Cervical / Trunk Assessment: Normal  Communication   Communication: No difficulties  Cognition Arousal/Alertness: Awake/alert Behavior During Therapy: Restless Overall Cognitive Status: Within Functional Limits for tasks assessed                                 General Comments: Disregards PT attempt to teach logroll or explain reasoning behind d/c recs. Legs are restless while lying in bed.        General Comments      Exercises     Assessment/Plan    PT Assessment Patient needs continued PT services  PT Problem List Decreased strength;Decreased range of motion;Decreased activity tolerance;Decreased safety awareness;Decreased balance;Pain;Decreased mobility;Cardiopulmonary status limiting activity       PT Treatment Interventions Balance training;Gait training;Neuromuscular re-education;Stair training;Functional mobility training;Patient/family education;Therapeutic activities;Therapeutic exercise    PT Goals (Current goals can be found in the Care Plan section)  Acute Rehab PT Goals Patient Stated Goal: to decrease pain PT Goal Formulation: With patient Time For Goal Achievement: 09/08/21 Potential to Achieve Goals: Fair    Frequency Min 2X/week  Co-evaluation               AM-PAC PT "6 Clicks" Mobility  Outcome Measure Help needed turning from your back to your side while in a flat bed without using bedrails?: A Little Help needed moving from lying on your back to sitting on the side of a flat bed without using bedrails?: A Little Help needed moving  to and from a bed to a chair (including a wheelchair)?: A Little Help needed standing up from a chair using your arms (e.g., wheelchair or bedside chair)?: A Little Help needed to walk in hospital room?: A Little Help needed climbing 3-5 steps with a railing? : A Lot 6 Click Score: 17    End of Session Equipment Utilized During Treatment: Gait belt;Back brace Activity Tolerance: Patient limited by pain Patient left: in bed;with call bell/phone within reach;with nursing/sitter in room;with family/visitor present Nurse Communication: Mobility status;Precautions PT Visit Diagnosis: Unsteadiness on feet (R26.81);Other abnormalities of gait and mobility (R26.89);History of falling (Z91.81);Muscle weakness (generalized) (M62.81);Difficulty in walking, not elsewhere classified (R26.2);Pain Pain - part of body:  (Back)    Time: 7564-3329 PT Time Calculation (min) (ACUTE ONLY): 26 min   Charges:   PT Evaluation $PT Eval Moderate Complexity: 1 Mod PT Treatments $Therapeutic Activity: 8-22 mins        Patrina Levering PT, DPT

## 2021-08-26 DIAGNOSIS — N179 Acute kidney failure, unspecified: Secondary | ICD-10-CM | POA: Diagnosis not present

## 2021-08-26 LAB — BASIC METABOLIC PANEL
Anion gap: 9 (ref 5–15)
BUN: 32 mg/dL — ABNORMAL HIGH (ref 6–20)
CO2: 22 mmol/L (ref 22–32)
Calcium: 7.6 mg/dL — ABNORMAL LOW (ref 8.9–10.3)
Chloride: 99 mmol/L (ref 98–111)
Creatinine, Ser: 1.18 mg/dL — ABNORMAL HIGH (ref 0.44–1.00)
GFR, Estimated: 54 mL/min — ABNORMAL LOW (ref >60)
Glucose, Bld: 115 mg/dL — ABNORMAL HIGH (ref 70–99)
Potassium: 4.1 mmol/L (ref 3.5–5.1)
Sodium: 130 mmol/L — ABNORMAL LOW (ref 135–145)

## 2021-08-26 LAB — CBC
HCT: 38.3 % (ref 36.0–46.0)
Hemoglobin: 13.2 g/dL (ref 12.0–15.0)
MCH: 34.3 pg — ABNORMAL HIGH (ref 26.0–34.0)
MCHC: 34.5 g/dL (ref 30.0–36.0)
MCV: 99.5 fL (ref 80.0–100.0)
Platelets: 141 10*3/uL — ABNORMAL LOW (ref 150–400)
RBC: 3.85 MIL/uL — ABNORMAL LOW (ref 3.87–5.11)
RDW: 14.2 % (ref 11.5–15.5)
WBC: 10.1 10*3/uL (ref 4.0–10.5)
nRBC: 0 % (ref 0.0–0.2)

## 2021-08-26 LAB — MAGNESIUM: Magnesium: 2.2 mg/dL (ref 1.7–2.4)

## 2021-08-26 MED ORDER — CYCLOBENZAPRINE HCL 10 MG PO TABS
10.0000 mg | ORAL_TABLET | Freq: Every day | ORAL | Status: DC
  Administered 2021-08-26 – 2021-09-01 (×7): 10 mg via ORAL
  Filled 2021-08-26 (×7): qty 1

## 2021-08-26 MED ORDER — VITAMIN D 25 MCG (1000 UNIT) PO TABS
1000.0000 [IU] | ORAL_TABLET | Freq: Every day | ORAL | Status: DC
  Administered 2021-08-26 – 2021-08-27 (×2): 1000 [IU] via ORAL
  Filled 2021-08-26 (×3): qty 1

## 2021-08-26 MED ORDER — ALBUTEROL SULFATE (2.5 MG/3ML) 0.083% IN NEBU: 2.5000 mg | INHALATION_SOLUTION | Freq: Four times a day (QID) | RESPIRATORY_TRACT | Status: DC | PRN

## 2021-08-26 MED ORDER — ALBUTEROL SULFATE (2.5 MG/3ML) 0.083% IN NEBU
3.0000 mL | INHALATION_SOLUTION | RESPIRATORY_TRACT | Status: DC | PRN
  Administered 2021-08-26: 3 mL via RESPIRATORY_TRACT
  Filled 2021-08-26: qty 3

## 2021-08-26 MED ORDER — ENOXAPARIN SODIUM 40 MG/0.4ML IJ SOSY
40.0000 mg | PREFILLED_SYRINGE | INTRAMUSCULAR | Status: DC
  Administered 2021-08-26 – 2021-08-31 (×6): 40 mg via SUBCUTANEOUS
  Filled 2021-08-26 (×6): qty 0.4

## 2021-08-26 MED ORDER — GABAPENTIN 300 MG PO CAPS
300.0000 mg | ORAL_CAPSULE | Freq: Two times a day (BID) | ORAL | Status: DC
  Administered 2021-08-26 – 2021-09-01 (×13): 300 mg via ORAL
  Filled 2021-08-26 (×13): qty 1

## 2021-08-26 NOTE — Hospital Course (Signed)
56 year old female with past medical history of COPD, GERD, hypertension, alcohol abuse with chronic nausea vomiting likely due to gastritis presented to the ED on early morning 08/25/2021 with complaints of recurrent nausea vomiting, mild diarrhea in addition to severe back pain.  ED course --hypertensive 170/133, tachycardic and tachypneic but afebrile.  Labs showed electrolyte abnormalities with hyponatremia, hypochloremia, hypokalemia and acute kidney injury with creatinine 2.49 up from 0.68 on 08/12/2021.  LFTs were elevated but improved.  Imaging --abdominal pelvic CT showed acute fracture deformities involving T10, T11, T12 and L3.  Subsequent upright thoracic plain films confirmed the same compression fractures.  Neurosurgery was consulted.  No intervention or further imaging was recommended.  They recommended TLSO brace when out of bed, PT OT and pain control.  Patient's nausea vomiting diarrhea have improved.  She states these are chronic, and come and go at baseline.

## 2021-08-26 NOTE — Progress Notes (Signed)
PHARMACIST - PHYSICIAN COMMUNICATION  CONCERNING:  Enoxaparin (Lovenox) for DVT Prophylaxis    RECOMMENDATION: Patient was prescribed enoxaparin '40mg'$  q24 hours for VTE prophylaxis.   Filed Weights   08/24/21 1928  Weight: 65.8 kg (145 lb)    Body mass index is 24.89 kg/m.  Estimated Creatinine Clearance: 49.1 mL/min (A) (by C-G formula based on SCr of 1.18 mg/dL (H)).  Patient is candidate for enoxaparin '40mg'$  every 24 hours based on CrCl >48m/min and Weight <45kg  DESCRIPTION: [facility]  Patient is now receiving enoxaparin 40 mg every 24 hours   ABenita Gutter6/05/2021 12:05 PM

## 2021-08-26 NOTE — Progress Notes (Signed)
Progress Note   Patient: Shirley Evans GEX:[id number] DOB: [birth year] DOA: 08/24/2021     1 DOS: the patient was seen and examined on 08/26/2021   Brief hospital course: 56 year old female with past medical history of COPD, GERD, hypertension, alcohol abuse with chronic nausea vomiting likely due to gastritis presented to the ED on early morning 08/25/2021 with complaints of recurrent nausea vomiting, mild diarrhea in addition to severe back pain.  ED course --hypertensive 170/133, tachycardic and tachypneic but afebrile.  Labs showed electrolyte abnormalities with hyponatremia, hypochloremia, hypokalemia and acute kidney injury with creatinine 2.49 up from 0.68 on 08/12/2021.  LFTs were elevated but improved.  Imaging --abdominal pelvic CT showed acute fracture deformities involving T10, T11, T12 and L3.  Subsequent upright thoracic plain films confirmed the same compression fractures.  Neurosurgery was consulted.  No intervention or further imaging was recommended.  They recommended TLSO brace when out of bed, PT OT and pain control.  Patient's nausea vomiting diarrhea have improved.  She states these are chronic, and come and go at baseline.  Assessment and Plan: * AKI (acute kidney injury) ([place]) Likely prerenal azotemia in the setting of nausea vomiting diarrhea.  Creatinine on admission was 2.49, up from 0.68 on 5/20. Renal function improving with IV hydration. -- Continue IV fluids --Monitor BMP --Avoid nephrotoxins, hypotension, renally dose meds -  Sepsis ([place]) Severe sepsis was present on admission as evidenced by leukocytosis, tachycardia and tachypnea, elevated lactic acid consistent with organ dysfunction in the setting of acute GI illness felt to likely be infectious. Treated with broad-spectrum antibiotics in the ED, continued on cefepime and Flagyl on admission.  Cefepime was de-escalated to Rocephin on 6/2. -- Continue Rocephin and Flagyl -- Follow cultures -- Supportive  care with IV hydration, antiemetics, PPI    Vertebral fracture, osteoporotic, initial encounter [facility]) Neurosurgery was consulted.  No intervention is recommended, conservative management only. --TLSO brace when out of bed --Pain control -scheduled Tylenol, lidocaine patches, as needed oxycodone, IV morphine for breakthrough --Neurosurgery follow-up outpatient in 3 weeks -- PT and OT evaluations -SNF recommended    Hypokalemia Potassium was replaced on admission. K today 4.1 -- Monitor BMP, replace K as needed  Acute gastroenteritis Unclear if infectious etiology or acutely flared gastritis related to her alcohol abuse. See sepsis for plan  Hypomagnesemia Severe, initial magnesium 0.6, replaced with 4 g IV Mg-sulfate on 6/2.  Mg today: 2.2 -- Monitor and replace Mg as needed  Dyslipidemia Continue statin  Depression Continue Zoloft and Wellbutrin  Hypertension IV labetalol as needed        Subjective: Patient was awake resting in bed when seen on rounds.  She reports ongoing severe and uncontrolled back pain and asks what can be done about it.  We discussed neurosurgery's recommendations.  She reports ongoing mild diarrhea but denies fevers chills, nausea vomiting.  No other acute complaints at this time  Physical Exam: Vitals:   08/25/21 1623 08/25/21 2033 08/26/21 0409 08/26/21 0737  BP: 140/86 (!) 127/109 135/89 (!) 142/107  Pulse: 69 78 75 72  Resp: '20 16 18 18  '$ Temp: 97.7 F (36.5 C) 97.8 F (36.6 C) 98.1 F (36.7 C) 98.3 F (36.8 C)  TempSrc:      SpO2: 91% 92% 90% 92%  Weight:      Height:       General exam: awake, alert, no acute distress, chronically ill-appearing HEENT: moist mucus membranes, hearing grossly normal  Respiratory system: CTAB but generally diminished, no  wheezes, rales or rhonchi, normal respiratory effort. Cardiovascular system: normal S1/S2, RRR, no pedal edema.   Central nervous system: A&O x. no gross focal neurologic  deficits, normal speech Extremities: moves all, no edema, normal tone Skin: dry, intact, normal temperature Psychiatry: normal mood, congruent affect, judgement and insight appear normal   Data Reviewed:  Notable labs: Sodium 130, glucose 115, BUN 32, creatinine improved 1.18, calcium 7.6 (albumin not available), GFR 54, platelets 141 down from 197  Family Communication: None  Disposition: Status is: Inpatient Remains inpatient appropriate because: Remains on IV fluids and IV antibiotics pending cultures, pain remains uncontrolled   Planned Discharge Destination: Skilled nursing facility    Time spent: 35 minutes  Author: Ezekiel Slocumb, DO 08/26/2021 3:02 PM  For on call review [email or web address].

## 2021-08-26 NOTE — Evaluation (Signed)
Occupational Therapy Evaluation Patient Details Name: Shirley Evans MRN: [id number] DOB: 04-10-1965 Today's Date: 08/26/2021   History of Present Illness 56 y.o. female admitted to hospital with onset of recurrent nausea and vomiting since Monday with associated mild diarrhea. Medical history significant for COPD, GERD, hypertension, alcohol abuse and chronic nausea and vomiting. Found to have acute compression fracture involving T10, T11, T12 and L3; does not remember a fall.   Clinical Impression   Pt seen for OT evaluation this date, on arrival pt was coming out of the bathroom without her TLSO brace on and without a walker, she was holding on the the door knob and bedside table.  She reported she disconnected her IV and needed to go to the bathroom.  Nursing notified.  Pt presents with muscle weakness, decreased transfers, decreased functional mobility, pain and decreased ability to perform self care tasks and would benefit from skilled OT services to maximize safety and independence in daily tasks.   She also has a history of COPD and would benefit from energy conservation techniques as well as instruction on adaptive equipment for lower body self care tasks secondary to fractures and pain.  Pt's significant other works outside the home and she is alone most of the day.  She will likely benefit from STR prior to returning home to increase her independence and safety with mobility.       Recommendations for follow up therapy are one component of a multi-disciplinary discharge planning process, led by the attending physician.  Recommendations may be updated based on patient status, additional functional criteria and insurance authorization.   Follow Up Recommendations  Skilled nursing-short term rehab (<3 hours/day)    Assistance Recommended at Discharge Frequent or constant Supervision/Assistance  Patient can return home with the following A little help with walking and/or transfers;A lot of  help with bathing/dressing/bathroom;Assistance with cooking/housework;Assist for transportation    Functional Status Assessment  Patient has had a recent decline in their functional status and demonstrates the ability to make significant improvements in function in a reasonable and predictable amount of time.  Equipment Recommendations       Recommendations for Other Services       Precautions / Restrictions Precautions Precautions: Fall Restrictions Weight Bearing Restrictions: No Other Position/Activity Restrictions: TLSO when OOB      Mobility Bed Mobility Overal bed mobility: Needs Assistance Bed Mobility: Rolling, Supine to Sit, Sit to Supine Rolling: Supervision   Supine to sit: Min assist, HOB elevated Sit to supine: Min guard   General bed mobility comments: impulsive with movements and not utilizing log roll technique with transfers    Transfers Overall transfer level: Needs assistance Equipment used: Rolling walker (2 wheels) Transfers: Sit to/from Stand Sit to Stand: Min guard, From elevated surface           General transfer comment: cues for safety with transfers, decreased control with stand to sit.      Balance Overall balance assessment: Needs assistance Sitting-balance support: Feet supported, No upper extremity supported Sitting balance-Leahy Scale: Fair     Standing balance support: Bilateral upper extremity supported, During functional activity, Reliant on assistive device for balance Standing balance-Leahy Scale: Poor                             ADL either performed or assessed with clinical judgement   ADL Overall ADL's : Needs assistance/impaired Eating/Feeding: Modified independent   Grooming: Modified independent  Upper Body Bathing: Modified independent   Lower Body Bathing: Moderate assistance Lower Body Bathing Details (indicate cue type and reason): assist with bathing lower legs and feet Upper Body Dressing :  Modified independent   Lower Body Dressing: Moderate assistance Lower Body Dressing Details (indicate cue type and reason): assist with lower body dressing, would benefit from instruction with use of AE Toilet Transfer: Min guard                   Vision Baseline Vision/History: 1 Wears glasses       Perception     Praxis      Pertinent Vitals/Pain Pain Assessment Pain Assessment: 0-10 Pain Score: 10-Worst pain ever Pain Location: spine Pain Descriptors / Indicators: Sharp, Aching, Constant, Grimacing, Restless Pain Intervention(s): Limited activity within patient's tolerance, Monitored during session     Hand Dominance Right   Extremity/Trunk Assessment Upper Extremity Assessment Upper Extremity Assessment: Generalized weakness   Lower Extremity Assessment Lower Extremity Assessment: Defer to PT evaluation   Cervical / Trunk Assessment Cervical / Trunk Assessment: Normal   Communication     Cognition Arousal/Alertness: Awake/alert Behavior During Therapy: Restless Overall Cognitive Status: Within Functional Limits for tasks assessed                                 General Comments: On arrival, pt had gotten up without her brace, disconnected her IV and went to the bathroom, was coming out of the bathroom as therapist came in.     General Comments       Exercises     Shoulder Instructions      Home Living Family/patient expects to be discharged to:: Private residence Living Arrangements: Spouse/significant other Available Help at Discharge: Family;Available PRN/intermittently Type of Home: House Home Access: Stairs to enter [employer] of Steps: 6 (front) and 2 (back) Entrance Stairs-Rails: Left Home Layout: One level     Bathroom Shower/Tub: Tub/shower unit;Curtain   Bathroom Toilet: Standard Bathroom Accessibility: No   Home Equipment: Conservation officer, nature (2 wheels);Cane - single point   Additional Comments: Does not  use AD at baseline.      Prior Functioning/Environment Prior Level of Function : Needs assist           ADLs (physical): IADLs Mobility Comments: Independent with household mobility; recalls 1 fall over the last 6 months and another last september leading to hip fx and THA. Rarely leaves home due to SOB associated with COPD. ADLs Comments: Able to dress and shower independently; does not cook, clean or go grocery shopping.        OT Problem List: Decreased strength;Decreased activity tolerance;Decreased knowledge of use of DME or AE;Impaired balance (sitting and/or standing);Decreased knowledge of precautions;Pain      OT Treatment/Interventions: Self-care/ADL training;Energy conservation;Balance training;Therapeutic exercise;DME and/or AE instruction;Therapeutic activities;Patient/family education    OT Goals(Current goals can be found in the care plan section) Acute Rehab OT Goals Patient Stated Goal: to go home and take care of myself OT Goal Formulation: With patient Time For Goal Achievement: 09/09/21 Potential to Achieve Goals: Good  OT Frequency: Min 2X/week    Co-evaluation              AM-PAC OT "6 Clicks" Daily Activity     Outcome Measure Help from another person eating meals?: None Help from another person taking care of personal grooming?: None Help from another person toileting, which includes using  toliet, bedpan, or urinal?: A Little Help from another person bathing (including washing, rinsing, drying)?: A Lot Help from another person to put on and taking off regular upper body clothing?: None Help from another person to put on and taking off regular lower body clothing?: A Lot 6 Click Score: 19   End of Session Equipment Utilized During Treatment: Rolling walker (2 wheels);Gait belt Nurse Communication: Other (comment) (Notified nursing regarding pt disconnecting IV and getting up wtihout assist.)  Activity Tolerance: Patient limited by pain Patient  left: in bed;with call bell/phone within reach;with bed alarm set  OT Visit Diagnosis: Unsteadiness on feet (R26.81);Muscle weakness (generalized) (M62.81);History of falling (Z91.81);Pain Pain - Right/Left: Right Pain - part of body:  (low back)                Time: 1040-1100 OT Time Calculation (min): 20 min Charges:  OT General Charges $OT Visit: 1 Visit OT Evaluation $OT Eval Low Complexity: 1 Low  Sheray Grist T Tracina Beaumont, OTR/L, CLT 08/26/2021, 11:19 AM

## 2021-08-26 NOTE — Assessment & Plan Note (Addendum)
Severe, initial magnesium 0.6. Recurrent/persistent, replacing daily.  Mg today: 1.1 >> 1.5 despite 4 g IV Mg yesterday. --4 g IV mag sulfate again today -- Monitor and replace Mg as needed

## 2021-08-26 NOTE — Progress Notes (Signed)
CSW spoke with patient who declined SNF and prefers home health. CSW also spoke with patients mother Vickii Chafe who also agrees with her daughter.

## 2021-08-26 NOTE — Plan of Care (Signed)

## 2021-08-27 DIAGNOSIS — D696 Thrombocytopenia, unspecified: Secondary | ICD-10-CM | POA: Diagnosis not present

## 2021-08-27 DIAGNOSIS — N179 Acute kidney failure, unspecified: Secondary | ICD-10-CM | POA: Diagnosis not present

## 2021-08-27 LAB — CBC
HCT: 38.7 % (ref 36.0–46.0)
Hemoglobin: 13 g/dL (ref 12.0–15.0)
MCH: 34.2 pg — ABNORMAL HIGH (ref 26.0–34.0)
MCHC: 33.6 g/dL (ref 30.0–36.0)
MCV: 101.8 fL — ABNORMAL HIGH (ref 80.0–100.0)
Platelets: 101 10*3/uL — ABNORMAL LOW (ref 150–400)
RBC: 3.8 MIL/uL — ABNORMAL LOW (ref 3.87–5.11)
RDW: 14 % (ref 11.5–15.5)
WBC: 8.2 10*3/uL (ref 4.0–10.5)
nRBC: 0 % (ref 0.0–0.2)

## 2021-08-27 LAB — PHOSPHORUS: Phosphorus: 2 mg/dL — ABNORMAL LOW (ref 2.5–4.6)

## 2021-08-27 LAB — COMPREHENSIVE METABOLIC PANEL
ALT: 58 U/L — ABNORMAL HIGH (ref 0–44)
AST: 100 U/L — ABNORMAL HIGH (ref 15–41)
Albumin: 3.2 g/dL — ABNORMAL LOW (ref 3.5–5.0)
Alkaline Phosphatase: 75 U/L (ref 38–126)
Anion gap: 4 — ABNORMAL LOW (ref 5–15)
BUN: 16 mg/dL (ref 6–20)
CO2: 24 mmol/L (ref 22–32)
Calcium: 8 mg/dL — ABNORMAL LOW (ref 8.9–10.3)
Chloride: 108 mmol/L (ref 98–111)
Creatinine, Ser: 0.77 mg/dL (ref 0.44–1.00)
GFR, Estimated: >60 mL/min (ref >60)
Glucose, Bld: 101 mg/dL — ABNORMAL HIGH (ref 70–99)
Potassium: 4.8 mmol/L (ref 3.5–5.1)
Sodium: 136 mmol/L (ref 135–145)
Total Bilirubin: 0.8 mg/dL (ref 0.3–1.2)
Total Protein: 6.3 g/dL — ABNORMAL LOW (ref 6.5–8.1)

## 2021-08-27 LAB — MAGNESIUM: Magnesium: 1.5 mg/dL — ABNORMAL LOW (ref 1.7–2.4)

## 2021-08-27 MED ORDER — LISINOPRIL 20 MG PO TABS
20.0000 mg | ORAL_TABLET | Freq: Every day | ORAL | Status: DC
  Administered 2021-08-27: 20 mg via ORAL
  Filled 2021-08-27 (×2): qty 1

## 2021-08-27 MED ORDER — MAGNESIUM SULFATE 2 GM/50ML IV SOLN
2.0000 g | Freq: Once | INTRAVENOUS | Status: DC
Start: 2021-08-27 — End: 2021-08-28
  Filled 2021-08-27: qty 50

## 2021-08-27 MED ORDER — HYDROCHLOROTHIAZIDE 12.5 MG PO TABS
12.5000 mg | ORAL_TABLET | Freq: Every day | ORAL | Status: DC
  Administered 2021-08-27 – 2021-08-30 (×4): 12.5 mg via ORAL
  Filled 2021-08-27 (×4): qty 1

## 2021-08-27 MED ORDER — LISINOPRIL-HYDROCHLOROTHIAZIDE 20-12.5 MG PO TABS
1.0000 | ORAL_TABLET | Freq: Every day | ORAL | Status: DC
Start: 2021-08-27 — End: 2021-08-27

## 2021-08-27 MED ORDER — POTASSIUM & SODIUM PHOSPHATES 280-160-250 MG PO PACK
1.0000 | PACK | Freq: Three times a day (TID) | ORAL | Status: AC
Start: 2021-08-27 — End: 2021-08-28
  Administered 2021-08-27 – 2021-08-28 (×4): 1 via ORAL
  Filled 2021-08-27 (×4): qty 1

## 2021-08-27 NOTE — Assessment & Plan Note (Signed)
Phosphorus 2.0 (6/4). --Phos-NaK packets x 1 day -- Repeat phosphorus level in the morning

## 2021-08-27 NOTE — Progress Notes (Signed)
Progress Note   Patient: Shirley Evans:[id number] DOB: 10-21-1965 DOA: 08/24/2021     2 DOS: the patient was seen and examined on 08/27/2021   Brief hospital course: 56 year old female with past medical history of COPD, GERD, hypertension, alcohol abuse with chronic nausea vomiting likely due to gastritis presented to the ED on early morning 08/25/2021 with complaints of recurrent nausea vomiting, mild diarrhea in addition to severe back pain.  ED course --hypertensive 170/133, tachycardic and tachypneic but afebrile.  Labs showed electrolyte abnormalities with hyponatremia, hypochloremia, hypokalemia and acute kidney injury with creatinine 2.49 up from 0.68 on 08/12/2021.  LFTs were elevated but improved.  Imaging --abdominal pelvic CT showed acute fracture deformities involving T10, T11, T12 and L3.  Subsequent upright thoracic plain films confirmed the same compression fractures.  Neurosurgery was consulted.  No intervention or further imaging was recommended.  They recommended TLSO brace when out of bed, PT OT and pain control.  Patient's nausea vomiting diarrhea have improved.  She states these are chronic, and come and go at baseline.  Assessment and Plan: * AKI (acute kidney injury) ([place]) Likely prerenal azotemia in the setting of nausea vomiting diarrhea.  Creatinine on admission was 2.49, up from 0.68 on 5/20. AKI resolved with IV hydration. Cr: 0/77 --Stop IV fluids --Monitor BMP  Sepsis ([place]) Severe sepsis was present on admission as evidenced by leukocytosis, tachycardia and tachypnea, elevated lactic acid consistent with organ dysfunction in the setting of acute GI illness felt to likely be infectious. Treated with broad-spectrum antibiotics in the ED, continued on cefepime and Flagyl on admission.  Cefepime was de-escalated to Rocephin on 6/2. -- Continue Rocephin and Flagyl -- Follow cultures -no growth to date -- Supportive care with IV hydration, antiemetics,  PPI    Vertebral fracture, osteoporotic, initial encounter [facility]) Neurosurgery was consulted.  No intervention is recommended, conservative management only. --TLSO brace when out of bed --Pain control -scheduled Tylenol, lidocaine patches, as needed oxycodone, IV morphine for breakthrough --Neurosurgery follow-up outpatient in 3 weeks -- PT and OT evaluations -SNF recommended    Hypokalemia Potassium was replaced on admission. K today 4.8 -- Monitor BMP, replace K as needed  Acute gastroenteritis Unclear if infectious etiology or acutely flared gastritis related to her alcohol abuse. See sepsis for plan  Hypomagnesemia Severe, initial magnesium 0.6, replaced with 4 g IV Mg-sulfate on 6/2. Recurrent.  Mg today: 2.2 >> 1.5 --2 g IV mag sulfate ordered -- Monitor and replace Mg as needed  Dyslipidemia Continue statin  Hypophosphatemia Phosphorus 2.0 (6/4). --Phos-NaK packets x 1 day -- Repeat phosphorus level in the morning  Thrombocytopenia ([facility]) Platelet count was normal on admission 341, downtrending since admission 197 >> 141 >> 101k today. Suspect due to sepsis, likely has some component of myelosuppression due to her alcohol abuse -- Monitor CBC daily -- Continue Lovenox for now, risk of HIT relatively low -- If downtrend continues, stop Lovenox and use SCDs  Depression Continue Zoloft and Wellbutrin  Hypertension IV labetalol as needed        Subjective: Patient awake resting in bed but writhing in pain during my encounter.  She reports ongoing watery diarrhea which she says is chronic for her.  She no longer has nausea or vomiting.  Tolerating diet okay.  She reports back pain is significantly uncontrolled and asked to have an MRI and for [place] to "do something".  Physical Exam: Vitals:   08/27/21 0748 08/27/21 1130 08/27/21 1540 08/27/21 1540  BP: Marland Kitchen)  157/112 (!) 164/105 (!) 158/109 (!) 158/109  Pulse: 75 76 75 74  Resp: '18 18 18 18  '$ Temp: 98.4 F  (36.9 C) 97.7 F (36.5 C) 97.7 F (36.5 C) 97.7 F (36.5 C)  TempSrc:      SpO2: 93% 90%  92%  Weight:      Height:       General exam: awake, alert, appears in mild distress due to pain, chronically ill-appearing Respiratory system: Normal respiratory effort on room air. Cardiovascular system: Brisk cap refill, no peripheral edema.   Central nervous system: A&O x. no gross focal neurologic deficits, normal speech Extremities: moves all, no edema, normal tone Skin: dry, intact, normal temperature, no rashes seen on visualized skin Psychiatry: normal mood, congruent affect, judgement and insight appear normal   Data Reviewed:  Notable labs: Glucose 101, calcium 8.0 with albumin 3.2, gap 4, phosphorus 2.0, magnesium 1.5, AST 100, ALT 58, total protein 6.3, platelets 101 down from 141    Family Communication: None  Disposition: Status is: Inpatient Remains inpatient appropriate because: Remains on IV fluids and IV antibiotics pending cultures, pain remains uncontrolled   Planned Discharge Destination: Home with home health, patient declines SNF as recommended      Time spent: 35 minutes  Author: Ezekiel Slocumb, DO 08/27/2021 4:30 PM  For on call review [email or web address].

## 2021-08-27 NOTE — Consult Note (Signed)
Neurosurgery-New Consultation Evaluation 08/27/2021 MYCA PERNO [id number]  Identifying Statement: Shirley Evans is a 56 y.o. female from [place] [address] with history of chronic nausea and vomiting, alcohol abuse, hypertension, COPD  Physician Requesting Consultation: No ref. provider found  History of Present Illness: Shirley Evans is a 56 y.o presenting to the ER on 08/24/2021 with intractable nausea and vomiting and sharp pain in both sides of her upper abdomen and middle back.  She states that this started without any particular inciting event and she denies any known trauma to her spine.  She underwent nominal CT scan which revealed multiple compression deformities.  She has remained in the hospital for continued symptoms.  She denies any radiating leg pain or lower extremity weakness.  Interval History:  Got her brace and has ambulated short distances. Continues to have pain with position changes. NO new neurologic issues.   Past Medical History:  Past Medical History:  Diagnosis Date   Cancer ([place])    cervical   COPD (chronic obstructive pulmonary disease) (HCC)    GERD (gastroesophageal reflux disease)    History of Helicobacter pylori infection    Hypertension    Menopausal state    Osteopenia    Postmenopausal atrophic vaginitis     Social History: Social History   Socioeconomic History   Marital status: Single    Spouse name: Not on file   Number of children: Not on file   Years of education: Not on file   Highest education level: Not on file  Occupational History   Not on file  Tobacco Use   Smoking status: Former    Packs/day: 1.00    Years: 30.00    Pack years: 30.00    Types: Cigarettes   Smokeless tobacco: Never  Vaping Use   Vaping Use: Never used  Substance and Sexual Activity   Alcohol use: Yes    Alcohol/week: 0.0 standard drinks   Drug use: Yes    Types: Marijuana    Comment: occasion recreational use   Sexual activity: Not on  file  Other Topics Concern   Not on file  Social History Narrative   Not on file   Social Determinants of Health   Financial Resource Strain: Not on file  Food Insecurity: Not on file  Transportation Needs: Not on file  Physical Activity: Not on file  Stress: Not on file  Social Connections: Not on file  Intimate Partner Violence: Not on file     Family History: Family History  Problem Relation Age of Onset   Diabetes Mother    Hypertension Mother    Breast cancer Paternal Aunt 92   Hypertension Father     Review of Systems:  Review of Systems - General ROS: Negative Psychological ROS: Negative Ophthalmic ROS: Negative ENT ROS: Negative Hematological and Lymphatic ROS: Negative  Endocrine ROS: Negative Respiratory ROS: Negative Cardiovascular ROS: Negative Gastrointestinal ROS: Negative Genito-Urinary ROS: Negative Musculoskeletal ROS: Negative Neurological ROS: Negative Dermatological ROS: Negative  Physical Exam: BP (!) 157/112 (BP Location: Left Arm)   Pulse 75   Temp 98.4 F (36.9 C)   Resp 18   Ht '5\' 4"'$  (1.626 m)   Wt 65.8 kg   SpO2 93%   BMI 24.89 kg/m  Body mass index is 24.89 kg/m. Body surface area is 1.72 meters squared. General appearance: Alert, cooperative, in no acute distress Head: Normocephalic, atraumatic Eyes: Normal, EOM intact Oropharynx: Moist without lesions Neck: Supple, no tenderness Heart: Normal, regular rate  and rhythm, without murmur Lungs: Clear to auscultation, good air exchange Abdomen: Soft, nondistended Ext: No edema in LE bilaterally, good distal pulses  Neurologic exam:  Mental status: alertness: alert, orientation: person, place, time, affect: normal Speech: fluent and clear Motor:strength symmetric 5/5, normal muscle mass and tone in all extremities and no pronator drift Sensory: intact to light touch in all extremities Reflexes: 2+ and symmetric bilaterally for arms and legs  Laboratory: Results for orders  placed or performed during the hospital encounter of 08/24/21  Culture, blood (routine x 2)   Specimen: BLOOD  Result Value Ref Range   Specimen Description BLOOD RIGHT HAND    Special Requests      BOTTLES DRAWN AEROBIC AND ANAEROBIC Blood Culture adequate volume   Culture      NO GROWTH 2 DAYS Performed at [facility], [address] [place], [place] [address]    Report Status PENDING   Culture, blood (routine x 2)   Specimen: BLOOD  Result Value Ref Range   Specimen Description BLOOD LEFT ARM    Special Requests      BOTTLES DRAWN AEROBIC AND ANAEROBIC Blood Culture adequate volume   Culture      NO GROWTH 2 DAYS Performed at [facility], [address] [place],  [address]    Report Status PENDING   MRSA Next Gen by PCR, Nasal   Specimen: Nasal Mucosa; Nasal Swab  Result Value Ref Range   MRSA by PCR Next Gen NOT DETECTED NOT DETECTED  Lipase, blood  Result Value Ref Range   Lipase 27 11 - 51 U/L  Comprehensive metabolic panel  Result Value Ref Range   Sodium 130 (L) 135 - 145 mmol/L   Potassium 2.7 (LL) 3.5 - 5.1 mmol/L   Chloride 82 (L) 98 - 111 mmol/L   CO2 24 22 - 32 mmol/L   Glucose, Bld 205 (H) 70 - 99 mg/dL   BUN 32 (H) 6 - 20 mg/dL   Creatinine, Ser 2.49 (H) 0.44 - 1.00 mg/dL   Calcium 9.6 8.9 - 10.3 mg/dL   Total Protein 9.0 (H) 6.5 - 8.1 g/dL   Albumin 4.7 3.5 - 5.0 g/dL   AST 113 (H) 15 - 41 U/L   ALT 75 (H) 0 - 44 U/L   Alkaline Phosphatase 108 38 - 126 U/L   Total Bilirubin 3.5 (H) 0.3 - 1.2 mg/dL   GFR, Estimated 22 (L) >60 mL/min   Anion gap 24 (H) 5 - 15  CBC  Result Value Ref Range   WBC 29.8 (H) 4.0 - 10.5 K/uL   RBC 5.34 (H) 3.87 - 5.11 MIL/uL   Hemoglobin 18.2 (H) 12.0 - 15.0 g/dL   HCT 50.5 (H) 36.0 - 46.0 %   MCV 94.6 80.0 - 100.0 fL   MCH 34.1 (H) 26.0 - 34.0 pg   MCHC 36.0 30.0 - 36.0 g/dL   RDW 14.5 11.5 - 15.5 %   Platelets 341 150 - 400 K/uL   nRBC 0.0 0.0 - 0.2 %  Urinalysis, Routine w reflex  microscopic  Result Value Ref Range   Color, Urine AMBER (A) YELLOW   APPearance CLOUDY (A) CLEAR   Specific Gravity, Urine 1.021 1.005 - 1.030   pH 5.0 5.0 - 8.0   Glucose, UA 50 (A) NEGATIVE mg/dL   Hgb urine dipstick LARGE (A) NEGATIVE   Bilirubin Urine NEGATIVE NEGATIVE   Ketones, ur 5 (A) NEGATIVE mg/dL   Protein, ur 100 (A)  NEGATIVE mg/dL   Nitrite NEGATIVE NEGATIVE   Leukocytes,Ua NEGATIVE NEGATIVE   RBC / HPF 0-5 0 - 5 RBC/hpf   WBC, UA 6-10 0 - 5 WBC/hpf   Bacteria, UA NONE SEEN NONE SEEN   Squamous Epithelial / LPF 6-10 0 - 5   Mucus PRESENT    Hyaline Casts, UA PRESENT    Amorphous Crystal PRESENT   Magnesium  Result Value Ref Range   Magnesium 0.6 (LL) 1.7 - 2.4 mg/dL  Lactic acid, plasma  Result Value Ref Range   Lactic Acid, Venous 3.1 (HH) 0.5 - 1.9 mmol/L  Lactic acid, plasma  Result Value Ref Range   Lactic Acid, Venous 2.5 (HH) 0.5 - 1.9 mmol/L  Procalcitonin - Baseline  Result Value Ref Range   Procalcitonin 0.27 ng/mL  Protime-INR  Result Value Ref Range   Prothrombin Time 14.0 11.4 - 15.2 seconds   INR 1.1 0.8 - 1.2  Ethanol  Result Value Ref Range   Alcohol, Ethyl (B) <10 <10 mg/dL  Protime-INR  Result Value Ref Range   Prothrombin Time 14.3 11.4 - 15.2 seconds   INR 1.1 0.8 - 1.2  Cortisol-am, blood  Result Value Ref Range   Cortisol - AM 74.4 (H) 6.7 - 22.6 ug/dL  Procalcitonin  Result Value Ref Range   Procalcitonin 0.19 ng/mL  Basic metabolic panel  Result Value Ref Range   Sodium 131 (L) 135 - 145 mmol/L   Potassium 2.9 (L) 3.5 - 5.1 mmol/L   Chloride 88 (L) 98 - 111 mmol/L   CO2 27 22 - 32 mmol/L   Glucose, Bld 138 (H) 70 - 99 mg/dL   BUN 34 (H) 6 - 20 mg/dL   Creatinine, Ser 2.16 (H) 0.44 - 1.00 mg/dL   Calcium 8.1 (L) 8.9 - 10.3 mg/dL   GFR, Estimated 26 (L) >60 mL/min   Anion gap 16 (H) 5 - 15  CBC  Result Value Ref Range   WBC 20.0 (H) 4.0 - 10.5 K/uL   RBC 4.82 3.87 - 5.11 MIL/uL   Hemoglobin 16.7 (H) 12.0 - 15.0 g/dL    HCT 46.5 (H) 36.0 - 46.0 %   MCV 96.5 80.0 - 100.0 fL   MCH 34.6 (H) 26.0 - 34.0 pg   MCHC 35.9 30.0 - 36.0 g/dL   RDW 14.5 11.5 - 15.5 %   Platelets 197 150 - 400 K/uL   nRBC 0.0 0.0 - 0.2 %  Lactic acid, plasma  Result Value Ref Range   Lactic Acid, Venous 0.9 0.5 - 1.9 mmol/L  Basic metabolic panel  Result Value Ref Range   Sodium 130 (L) 135 - 145 mmol/L   Potassium 4.1 3.5 - 5.1 mmol/L   Chloride 99 98 - 111 mmol/L   CO2 22 22 - 32 mmol/L   Glucose, Bld 115 (H) 70 - 99 mg/dL   BUN 32 (H) 6 - 20 mg/dL   Creatinine, Ser 1.18 (H) 0.44 - 1.00 mg/dL   Calcium 7.6 (L) 8.9 - 10.3 mg/dL   GFR, Estimated 54 (L) >60 mL/min   Anion gap 9 5 - 15  Magnesium  Result Value Ref Range   Magnesium 2.2 1.7 - 2.4 mg/dL  CBC  Result Value Ref Range   WBC 10.1 4.0 - 10.5 K/uL   RBC 3.85 (L) 3.87 - 5.11 MIL/uL   Hemoglobin 13.2 12.0 - 15.0 g/dL   HCT 38.3 36.0 - 46.0 %   MCV 99.5 80.0 - 100.0 fL  MCH 34.3 (H) 26.0 - 34.0 pg   MCHC 34.5 30.0 - 36.0 g/dL   RDW 14.2 11.5 - 15.5 %   Platelets 141 (L) 150 - 400 K/uL   nRBC 0.0 0.0 - 0.2 %  Comprehensive metabolic panel  Result Value Ref Range   Sodium 136 135 - 145 mmol/L   Potassium 4.8 3.5 - 5.1 mmol/L   Chloride 108 98 - 111 mmol/L   CO2 24 22 - 32 mmol/L   Glucose, Bld 101 (H) 70 - 99 mg/dL   BUN 16 6 - 20 mg/dL   Creatinine, Ser 0.77 0.44 - 1.00 mg/dL   Calcium 8.0 (L) 8.9 - 10.3 mg/dL   Total Protein 6.3 (L) 6.5 - 8.1 g/dL   Albumin 3.2 (L) 3.5 - 5.0 g/dL   AST 100 (H) 15 - 41 U/L   ALT 58 (H) 0 - 44 U/L   Alkaline Phosphatase 75 38 - 126 U/L   Total Bilirubin 0.8 0.3 - 1.2 mg/dL   GFR, Estimated >60 >60 mL/min   Anion gap 4 (L) 5 - 15  Magnesium  Result Value Ref Range   Magnesium 1.5 (L) 1.7 - 2.4 mg/dL  CBC  Result Value Ref Range   WBC 8.2 4.0 - 10.5 K/uL   RBC 3.80 (L) 3.87 - 5.11 MIL/uL   Hemoglobin 13.0 12.0 - 15.0 g/dL   HCT 38.7 36.0 - 46.0 %   MCV 101.8 (H) 80.0 - 100.0 fL   MCH 34.2 (H) 26.0 - 34.0 pg    MCHC 33.6 30.0 - 36.0 g/dL   RDW 14.0 11.5 - 15.5 %   Platelets 101 (L) 150 - 400 K/uL   nRBC 0.0 0.0 - 0.2 %  Phosphorus  Result Value Ref Range   Phosphorus 2.0 (L) 2.5 - 4.6 mg/dL  Troponin I (High Sensitivity)  Result Value Ref Range   Troponin I (High Sensitivity) 30 (H) <18 ng/L   I personally reviewed labs  Imaging:  08/24/21 CT abdomen and pelvis IMPRESSION: 1. Acute fracture deformities involving the T10, T11, T12 and L3 vertebral bodies. MRI correlation is recommended. 2. Cholelithiasis. 3. Small hiatal hernia. 4. 7 mm nonobstructing left renal calculus. 5. Hepatic steatosis without focal liver lesions. 6. 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   Aortic Atherosclerosis (ICD10-I70.0).     Electronically Signed   By: Virgina Norfolk M.D.   On: 08/25/2021 00:14  Narrative & Impression  CLINICAL DATA:  Multiple closed thoracic compression fractures.   EXAM: THORACIC SPINE 2 VIEWS   COMPARISON:  08/25/2021 chest radiograph and CT abdomen pelvis 08/24/2021.   FINDINGS: Images were taken with the patient's back brace in place. T10, T11 and T12 compression fractures, as on 08/24/2021. L3 superior endplate compression fracture, better seen on yesterday's CT. Alignment is anatomic. No additional fractures.   IMPRESSION: T10, T11 and T12 compression fractures, as on CT yesterday.     Electronically Signed   By: Lorin Picket M.D.   On: 08/25/2021 15:10        Impression/Plan:     1.  Diagnosis: Multiple compression deformities.  2.  Plan  -Xrays Stable as expected in brace, will serve as baseline for follow up. -Continue TLSO brace when out of bed -Continue remainder of care per internal medicine recommendations -No plan for neurosurgical intervention at this time. -Follow-up outpatient in 3 weeks with xrays  - Please call with any questions or concerns.  Stevan Born, MD [facility]

## 2021-08-27 NOTE — Assessment & Plan Note (Signed)
Platelet count was normal on admission 341, downtrending since admission 197 >> 141 >> 101k today. Suspect due to sepsis, likely has some component of myelosuppression due to her alcohol abuse -- Monitor CBC daily -- Continue Lovenox for now, risk of HIT relatively low -- If downtrend continues, stop Lovenox and use SCDs

## 2021-08-28 DIAGNOSIS — N179 Acute kidney failure, unspecified: Secondary | ICD-10-CM | POA: Diagnosis not present

## 2021-08-28 DIAGNOSIS — K529 Noninfective gastroenteritis and colitis, unspecified: Secondary | ICD-10-CM | POA: Diagnosis present

## 2021-08-28 LAB — CBC
HCT: 39.6 % (ref 36.0–46.0)
Hemoglobin: 13.4 g/dL (ref 12.0–15.0)
MCH: 34.6 pg — ABNORMAL HIGH (ref 26.0–34.0)
MCHC: 33.8 g/dL (ref 30.0–36.0)
MCV: 102.3 fL — ABNORMAL HIGH (ref 80.0–100.0)
Platelets: 109 10*3/uL — ABNORMAL LOW (ref 150–400)
RBC: 3.87 MIL/uL (ref 3.87–5.11)
RDW: 14 % (ref 11.5–15.5)
WBC: 7.4 10*3/uL (ref 4.0–10.5)
nRBC: 0 % (ref 0.0–0.2)

## 2021-08-28 LAB — COMPREHENSIVE METABOLIC PANEL
ALT: 64 U/L — ABNORMAL HIGH (ref 0–44)
AST: 95 U/L — ABNORMAL HIGH (ref 15–41)
Albumin: 3.3 g/dL — ABNORMAL LOW (ref 3.5–5.0)
Alkaline Phosphatase: 86 U/L (ref 38–126)
Anion gap: 6 (ref 5–15)
BUN: 11 mg/dL (ref 6–20)
CO2: 27 mmol/L (ref 22–32)
Calcium: 9.5 mg/dL (ref 8.9–10.3)
Chloride: 105 mmol/L (ref 98–111)
Creatinine, Ser: 0.72 mg/dL (ref 0.44–1.00)
GFR, Estimated: >60 mL/min (ref >60)
Glucose, Bld: 90 mg/dL (ref 70–99)
Potassium: 4.7 mmol/L (ref 3.5–5.1)
Sodium: 138 mmol/L (ref 135–145)
Total Bilirubin: 0.7 mg/dL (ref 0.3–1.2)
Total Protein: 6.6 g/dL (ref 6.5–8.1)

## 2021-08-28 LAB — MAGNESIUM: Magnesium: 1.1 mg/dL — ABNORMAL LOW (ref 1.7–2.4)

## 2021-08-28 MED ORDER — VITAMIN D 25 MCG (1000 UNIT) PO TABS
1000.0000 [IU] | ORAL_TABLET | Freq: Every day | ORAL | Status: DC
  Administered 2021-08-28 – 2021-09-01 (×5): 1000 [IU] via ORAL
  Filled 2021-08-28 (×5): qty 1

## 2021-08-28 MED ORDER — MAGNESIUM SULFATE 4 GM/100ML IV SOLN
4.0000 g | Freq: Once | INTRAVENOUS | Status: AC
  Administered 2021-08-28: 4 g via INTRAVENOUS
  Filled 2021-08-28: qty 100

## 2021-08-28 MED ORDER — PSYLLIUM 95 % PO PACK
1.0000 | PACK | Freq: Every day | ORAL | Status: DC
  Administered 2021-08-28 – 2021-08-31 (×2): 1 via ORAL
  Filled 2021-08-28 (×5): qty 1

## 2021-08-28 MED ORDER — LISINOPRIL 20 MG PO TABS
40.0000 mg | ORAL_TABLET | Freq: Every day | ORAL | Status: DC
  Administered 2021-08-28 – 2021-09-01 (×5): 40 mg via ORAL
  Filled 2021-08-28 (×4): qty 2

## 2021-08-28 NOTE — Progress Notes (Signed)
Progress Note   Patient: Shirley Evans JEH:[id number] DOB: [birth year] DOA: 08/24/2021     3 DOS: the patient was seen and examined on 08/28/2021   Brief hospital course: 56 year old female with past medical history of COPD, GERD, hypertension, alcohol abuse with chronic nausea vomiting likely due to gastritis presented to the ED on early morning 08/25/2021 with complaints of recurrent nausea vomiting, mild diarrhea in addition to severe back pain.  ED course --hypertensive 170/133, tachycardic and tachypneic but afebrile.  Labs showed electrolyte abnormalities with hyponatremia, hypochloremia, hypokalemia and acute kidney injury with creatinine 2.49 up from 0.68 on 08/12/2021.  LFTs were elevated but improved.  Imaging --abdominal pelvic CT showed acute fracture deformities involving T10, T11, T12 and L3.  Subsequent upright thoracic plain films confirmed the same compression fractures.  Neurosurgery was consulted.  No intervention or further imaging was recommended.  They recommended TLSO brace when out of bed, PT OT and pain control.  Patient's nausea vomiting diarrhea have improved.  She states these are chronic, and come and go at baseline.  Assessment and Plan: * AKI (acute kidney injury) ([place]) Likely prerenal azotemia in the setting of nausea vomiting diarrhea.  Creatinine on admission was 2.49, up from 0.68 on 5/20. AKI resolved with IV hydration. Cr: 0/77 --Off IV fluids --Monitor BMP  Sepsis ([place]) Severe sepsis was present on admission as evidenced by leukocytosis, tachycardia and tachypnea, elevated lactic acid consistent with organ dysfunction in the setting of acute GI illness felt to likely be infectious. Treated with broad-spectrum antibiotics in the ED, continued on cefepime and Flagyl on admission.  Cefepime was de-escalated to Rocephin on 6/2. -- Continue Rocephin and Flagyl -- Follow cultures -no growth to date -- Supportive care with IV hydration, antiemetics,  PPI    Vertebral fracture, osteoporotic, initial encounter [facility]) Neurosurgery was consulted.  No intervention is recommended, conservative management only. --TLSO brace when out of bed --Pain control -scheduled Tylenol, lidocaine patches, as needed oxycodone, IV morphine for breakthrough --Neurosurgery follow-up outpatient in 3 weeks -- PT and OT evaluations -SNF recommended    Hypokalemia Potassium was replaced on admission. K today 4.8 -- Monitor BMP, replace K as needed  Acute gastroenteritis Unclear if infectious etiology or acutely flared gastritis related to her alcohol abuse. See sepsis for plan  Hypomagnesemia Severe, initial magnesium 0.6, replaced.  Improved to 2.2. Recurrent.  Mg today: 1.1 despite multiple replacements since admission --4 g IV mag sulfate ordered -- Monitor and replace Mg as needed  Dyslipidemia Continue statin  Chronic diarrhea Suspect some degree of chronic pancreatitis given her alcoholism. --Metamucil --Imodium PRM --GI follow up outpatient  Hypophosphatemia Phosphorus 2.0 (6/4). --Phos-NaK packets x 1 day -- Repeat phosphorus level in the morning  Thrombocytopenia ([facility]) Platelet count was normal on admission 341, downtrending since admission 197 >> 141 >> 101k today. Suspect due to sepsis, likely has some component of myelosuppression due to her alcohol abuse -- Monitor CBC daily -- Continue Lovenox for now, risk of HIT relatively low -- If downtrend continues, stop Lovenox and use SCDs  Depression Continue Zoloft and Wellbutrin  Hypertension Increased lisinopril. IV labetalol as needed        Subjective: Patient awake and appears resting comfortably in bed when seen on rounds.  She becomes fidgety and restless during my encounter with her.  She was not initially complaining of severe pain but increasingly focused on it while I was talking with her and requested multiple times I asked nurse to bring her  pain pills.  She  reports ongoing diarrhea, drinking Metamucil right now.  Tolerating diet.  No other acute complaints.  She request we "do something" to fix her back.  Says she may have a new fracture because she felt something pop earlier.  Physical Exam: Vitals:   08/28/21 0446 08/28/21 0817 08/28/21 1227 08/28/21 1557  BP: (!) 160/90 (!) 164/106 (!) 162/108 (!) 128/101  Pulse: 69 66 69 84  Resp: '18 18 20   '$ Temp: 98.1 F (36.7 C) 98 F (36.7 C) 97.9 F (36.6 C) 97.8 F (36.6 C)  TempSrc: Oral  Oral   SpO2: 94% 95% 95% 95%  Weight:      Height:       General exam: awake, alert, no acute distress, chronically ill-appearing Respiratory system: Normal respiratory effort on room air. Cardiovascular system: Brisk cap refill, no peripheral edema.   Central nervous system: A&O x4. no gross focal neurologic deficits, normal speech Extremities: moves all, no edema, normal tone Skin: dry, intact, normal temperature, no rashes seen on visualized skin Psychiatry: normal mood, restless congruent affect, judgement and insight appear normal   Data Reviewed:  Notable labs: [place] 1.1, albumin 3.3, AST improved 95 ALT up slightly 64  Platelets up to 109 from 101  Family Communication: None  Disposition: Status is: Inpatient Remains inpatient appropriate because: Pain uncontrolled, ongoing electrolyte derangements requiring close monitoring and replacement   Planned Discharge Destination: Home with home health, patient declines SNF as recommended      Time spent: 35 minutes  Author: Ezekiel Slocumb, DO 08/28/2021 6:36 PM  For on call review [email or web address].

## 2021-08-28 NOTE — Progress Notes (Signed)
PT Cancellation Note  Patient Details Name: Shirley Evans MRN: [id number] DOB: 04-Dec-1965   Cancelled Treatment:    Reason Eval/Treat Not Completed: Other (comment). Treatment attempted this date. Pain meds given approx 1.5 hours prior, however pt report limited effectiveness. Pt currently refused any mobility at this time and reports she is unhappy with the TLSO reporting discomfort. She says she has been getting up to [facility] without brace and feels her back is worse. Attempted patient education on donning TLSO and adjustment, however pt continues to refuse therapy at this time. Reports she will work with therapy next date. Will re-attempt tomorrow.   Nasira Janusz 08/28/2021, 1:55 PM Greggory Stallion, PT, DPT, GCS [phone]

## 2021-08-28 NOTE — Plan of Care (Signed)
  Problem: Fluid Volume: Goal: Hemodynamic stability will improve Outcome: Progressing   Problem: Clinical Measurements: Goal: Signs and symptoms of infection will decrease Outcome: Progressing   

## 2021-08-28 NOTE — Assessment & Plan Note (Signed)
Suspect some degree of chronic pancreatitis given her alcoholism. --Metamucil --Imodium PRM --GI follow up outpatient

## 2021-08-28 NOTE — Plan of Care (Signed)

## 2021-08-28 NOTE — Progress Notes (Signed)
Met with the patient at the bedside She lives at home with her boyfriend He works second shift Her boyfriend and her mother provide transportation She has a rolling walker and a cane at home She refuses to go to [facility] She wants home health,  [facility] accepted her for [facility] She can afford her medication

## 2021-08-28 NOTE — Care Management Important Message (Signed)
Important Message  Patient Details  Name: Shirley Evans MRN: [id number] Date of Birth: November 15, 1965   Medicare Important Message Given:  Yes     Juliann Pulse A Jaia Alonge 08/28/2021, 1:11 PM

## 2021-08-28 NOTE — Progress Notes (Signed)
Vitals entered manually ° °

## 2021-08-29 ENCOUNTER — Inpatient Hospital Stay: Payer: Medicare HMO

## 2021-08-29 DIAGNOSIS — N179 Acute kidney failure, unspecified: Secondary | ICD-10-CM | POA: Diagnosis not present

## 2021-08-29 LAB — COMPREHENSIVE METABOLIC PANEL
ALT: 64 U/L — ABNORMAL HIGH (ref 0–44)
AST: 90 U/L — ABNORMAL HIGH (ref 15–41)
Albumin: 3.1 g/dL — ABNORMAL LOW (ref 3.5–5.0)
Alkaline Phosphatase: 91 U/L (ref 38–126)
Anion gap: 9 (ref 5–15)
BUN: 11 mg/dL (ref 6–20)
CO2: 25 mmol/L (ref 22–32)
Calcium: 9.4 mg/dL (ref 8.9–10.3)
Chloride: 101 mmol/L (ref 98–111)
Creatinine, Ser: 0.91 mg/dL (ref 0.44–1.00)
GFR, Estimated: >60 mL/min (ref >60)
Glucose, Bld: 107 mg/dL — ABNORMAL HIGH (ref 70–99)
Potassium: 4.4 mmol/L (ref 3.5–5.1)
Sodium: 135 mmol/L (ref 135–145)
Total Bilirubin: 0.8 mg/dL (ref 0.3–1.2)
Total Protein: 6.3 g/dL — ABNORMAL LOW (ref 6.5–8.1)

## 2021-08-29 LAB — CBC
HCT: 37.2 % (ref 36.0–46.0)
Hemoglobin: 12.8 g/dL (ref 12.0–15.0)
MCH: 35.1 pg — ABNORMAL HIGH (ref 26.0–34.0)
MCHC: 34.4 g/dL (ref 30.0–36.0)
MCV: 101.9 fL — ABNORMAL HIGH (ref 80.0–100.0)
Platelets: 129 10*3/uL — ABNORMAL LOW (ref 150–400)
RBC: 3.65 MIL/uL — ABNORMAL LOW (ref 3.87–5.11)
RDW: 14.1 % (ref 11.5–15.5)
WBC: 6.7 10*3/uL (ref 4.0–10.5)
nRBC: 0 % (ref 0.0–0.2)

## 2021-08-29 LAB — MAGNESIUM: Magnesium: 1.5 mg/dL — ABNORMAL LOW (ref 1.7–2.4)

## 2021-08-29 LAB — PHOSPHORUS: Phosphorus: 4.4 mg/dL (ref 2.5–4.6)

## 2021-08-29 MED ORDER — MAGNESIUM SULFATE 4 GM/100ML IV SOLN
4.0000 g | Freq: Once | INTRAVENOUS | Status: AC
  Administered 2021-08-29: 4 g via INTRAVENOUS
  Filled 2021-08-29: qty 100

## 2021-08-29 NOTE — Progress Notes (Signed)
Occupational Therapy Treatment Patient Details Name: Shirley Evans MRN: [id number] DOB: 1966/01/02 Today's Date: 08/29/2021   History of present illness 56 y.o. female admitted to hospital with onset of recurrent nausea and vomiting since Monday with associated mild diarrhea. Medical history significant for COPD, GERD, hypertension, alcohol abuse and chronic nausea and vomiting. Found to have acute compression fracture involving T10, T11, T12 and L3; does not remember a fall.   OT comments  Pt seen for brief OT tx. Pt endorsing "1,000/10" back pain but noting she needed to use the bathroom. Pt instructed in log roll technique and able to return demo with supervision and significant pain/effort to complete. Pt donned slip on sandals EOB without assist. Pt completed transfers from EOB and toilet as well as mobility with SBA as pt declines RW, declines back brace, and instead reaches for the door knob and door frame to steady herself. Mod indep with pericare after toileting and remote supervision for clothing mgt. Pt returned to bed, did not use log roll despite VC to utilize. RN notified of pain. Pt continues to be significantly pain limited, declining measures to address safety with mobility and ADL. Continue to recommend SNF at this time.    Recommendations for follow up therapy are one component of a multi-disciplinary discharge planning process, led by the attending physician.  Recommendations may be updated based on patient status, additional functional criteria and insurance authorization.    Follow Up Recommendations  Skilled nursing-short term rehab (<3 hours/day)    Assistance Recommended at Discharge Frequent or constant Supervision/Assistance  Patient can return home with the following  A little help with walking and/or transfers;A lot of help with bathing/dressing/bathroom;Assistance with cooking/housework;Assist for transportation   Equipment Recommendations  Other (comment) (2WW)     Recommendations for Other Services      Precautions / Restrictions Precautions Precautions: Fall Required Braces or Orthoses: Spinal Brace Spinal Brace: Thoracolumbosacral orthotic;Applied in sitting position Restrictions Weight Bearing Restrictions: No Other Position/Activity Restrictions: TLSO when OOB       Mobility Bed Mobility Overal bed mobility: Needs Assistance Bed Mobility: Rolling, Supine to Sit, Sit to Supine, Sidelying to Sit Rolling: Supervision Sidelying to sit: Supervision Supine to sit: Supervision Sit to supine: Supervision   General bed mobility comments: pt instructed in log roll and completed wiht supervision and significant pain. back to bed pt did not use despite cues    Transfers Overall transfer level: Needs assistance Equipment used: None Transfers: Sit to/from Stand Sit to Stand: Supervision           General transfer comment: pt declined RW, declined handheld assist, reached out for door knob and door frame     Balance Overall balance assessment: Needs assistance Sitting-balance support: Feet supported, No upper extremity supported Sitting balance-Leahy Scale: Fair     Standing balance support: Single extremity supported, No upper extremity supported, During functional activity Standing balance-Leahy Scale: Fair Standing balance comment: no overt LOB but unsteady                           ADL either performed or assessed with clinical judgement   ADL Overall ADL's : Needs assistance/impaired     Grooming: Standing;Supervision/safety;Wash/dry Teacher, music: Copy Details (indicate cue type and reason): declines brace Toileting- Clothing Manipulation and Hygiene: Modified independent  Functional mobility during ADLs: Supervision/safety      Extremity/Trunk Assessment              Vision       Perception     Praxis      Cognition  Arousal/Alertness: Awake/alert Behavior During Therapy: Impulsive, Restless Overall Cognitive Status: Within Functional Limits for tasks assessed                                 General Comments: Pt requires cues throughout session for safety and strategies to minimize back pain with mobility/ADL, refuses brace        Exercises Other Exercises Other Exercises: pt educated in log roll technique, positioning in the bed, and benefits of brace to minimize back pain    Shoulder Instructions       General Comments      Pertinent Vitals/ Pain       Pain Assessment Pain Assessment: 0-10 Pain Score: 10-Worst pain ever Pain Location: "1,000/10" middle back Pain Descriptors / Indicators: Sharp, Aching, Constant, Grimacing, Restless, Moaning, Guarding Pain Intervention(s): Limited activity within patient's tolerance, Monitored during session, Premedicated before session, Repositioned, Patient requesting pain meds-RN notified  Home Living                                          Prior Functioning/Environment              Frequency  Min 2X/week        Progress Toward Goals  OT Goals(current goals can now be found in the care plan section)  Progress towards OT goals: Progressing toward goals  Acute Rehab OT Goals Patient Stated Goal: to go home and take care of myself OT Goal Formulation: With patient Time For Goal Achievement: 09/09/21 Potential to Achieve Goals: Good  Plan Discharge plan remains appropriate;Frequency remains appropriate    Co-evaluation                 AM-PAC OT "6 Clicks" Daily Activity     Outcome Measure   Help from another person eating meals?: None Help from another person taking care of personal grooming?: None Help from another person toileting, which includes using toliet, bedpan, or urinal?: A Little Help from another person bathing (including washing, rinsing, drying)?: A Lot Help from another person  to put on and taking off regular upper body clothing?: None Help from another person to put on and taking off regular lower body clothing?: A Lot 6 Click Score: 19    End of Session    OT Visit Diagnosis: Unsteadiness on feet (R26.81);Muscle weakness (generalized) (M62.81);History of falling (Z91.81);Pain Pain - Right/Left: Right Pain - part of body:  (middle back)   Activity Tolerance Patient limited by pain   Patient Left in bed;with call bell/phone within reach   Nurse Communication Patient requests pain meds        Time: 3009-2330 OT Time Calculation (min): 10 min  Charges: OT General Charges $OT Visit: 1 Visit OT Treatments $Self Care/Home Management : 8-22 mins  Ardeth Perfect., MPH, MS, OTR/L ascom [phone] 08/29/21, 10:53 AM

## 2021-08-29 NOTE — Progress Notes (Signed)
Physical Therapy Treatment Patient Details Name: Shirley Evans MRN: [id number] DOB: 02/26/1966 Today's Date: 08/29/2021   History of Present Illness 56 y.o. female admitted to hospital with onset of recurrent nausea and vomiting since Monday with associated mild diarrhea. Medical history significant for COPD, GERD, hypertension, alcohol abuse and chronic nausea and vomiting. Found to have acute compression fracture involving T10, T11, T12 and L3; does not remember a fall.    PT Comments    Pt is making gradual progress towards goals with improved ability to tolerate mobility efforts. TLSO donned while seated at EOB with adjustments made for improved fit/comfort. Pt able to ambulate around RN station using RW and perform stair training safely despite pain. Updating dc recs to reflect HHPT and pt education given regarding back precautions. Again, demonstrated log roll technique to protect back. Will continue to progress.   Recommendations for follow up therapy are one component of a multi-disciplinary discharge planning process, led by the attending physician.  Recommendations may be updated based on patient status, additional functional criteria and insurance authorization.  Follow Up Recommendations  Home health PT     Assistance Recommended at Discharge Intermittent Supervision/Assistance  Patient can return home with the following A little help with walking and/or transfers;A little help with bathing/dressing/bathroom;Assist for transportation;Help with stairs or ramp for entrance   Equipment Recommendations  None recommended by PT    Recommendations for Other Services       Precautions / Restrictions Precautions Precautions: Fall;Back Precaution Booklet Issued: Yes (comment) Required Braces or Orthoses: Spinal Brace Spinal Brace: Thoracolumbosacral orthotic;Applied in sitting position Restrictions Weight Bearing Restrictions: No Other Position/Activity Restrictions: TLSO when  OOB     Mobility  Bed Mobility Overal bed mobility: Needs Assistance Bed Mobility: Supine to Sit     Supine to sit: Modified independent (Device/Increase time) Sit to supine: Modified independent (Device/Increase time)   General bed mobility comments: cues for log roll with cues for technique. Pt unable to perform and performs sup->sit with increased pain. Written hand out for education given and reviewed at end of session.    Transfers Overall transfer level: Needs assistance Equipment used: Rolling walker (2 wheels) Transfers: Sit to/from Stand Sit to Stand: Supervision           General transfer comment: cues for hand placement. Tends to reach for IV pole vs RW. Cues for safety    Ambulation/Gait Ambulation/Gait assistance: Supervision Gait Distance (Feet): 200 Feet Assistive device: Rolling walker (2 wheels) Gait Pattern/deviations: Step-through pattern, Trunk flexed, Wide base of support, Decreased stride length       General Gait Details: reliance on the RW for pain relief. TLSO donned prior to mobility. Reports 10/10 pain with exertion. Appears severe on L LE   Stairs Stairs: Yes Stairs assistance: Min guard Stair Management: One rail Left, Step to pattern, Forwards Number of Stairs: 2 General stair comments: performed stair training with cues for sequencing and use of hand railing. Pt educated to decend with L LE with improved technique.   Wheelchair Mobility    Modified Rankin (Stroke Patients Only)       Balance Overall balance assessment: Needs assistance Sitting-balance support: Feet supported, No upper extremity supported Sitting balance-Leahy Scale: Fair     Standing balance support: Bilateral upper extremity supported Standing balance-Leahy Scale: Fair                              Cognition Arousal/Alertness:  Awake/alert Behavior During Therapy: Impulsive Overall Cognitive Status: Within Functional Limits for tasks  assessed                                 General Comments: slightly agitated to be asked to perform mobility, however agreeable to attempt        Exercises Other Exercises Other Exercises: educated in brace application with ability to don/doff brace with assistance. Cues for back precautions    General Comments        Pertinent Vitals/Pain Pain Assessment Pain Assessment: 0-10 Pain Score: 10-Worst pain ever Pain Location: mid back Pain Descriptors / Indicators: Discomfort, Dull, Grimacing, Guarding Pain Intervention(s): Limited activity within patient's tolerance, Premedicated before session    Home Living                          Prior Function            PT Goals (current goals can now be found in the care plan section) Acute Rehab PT Goals Patient Stated Goal: to decrease pain PT Goal Formulation: With patient Time For Goal Achievement: 09/08/21 Potential to Achieve Goals: Fair Progress towards PT goals: Progressing toward goals    Frequency    Min 2X/week      PT Plan Discharge plan needs to be updated    Co-evaluation              AM-PAC PT "6 Clicks" Mobility   Outcome Measure  Help needed turning from your back to your side while in a flat bed without using bedrails?: A Little Help needed moving from lying on your back to sitting on the side of a flat bed without using bedrails?: A Little Help needed moving to and from a bed to a chair (including a wheelchair)?: A Little Help needed standing up from a chair using your arms (e.g., wheelchair or bedside chair)?: A Little Help needed to walk in hospital room?: A Little Help needed climbing 3-5 steps with a railing? : A Little 6 Click Score: 18    End of Session Equipment Utilized During Treatment: Back brace Activity Tolerance: Patient limited by pain Patient left: in bed;with bed alarm set Nurse Communication: Mobility status PT Visit Diagnosis: Unsteadiness on feet  (R26.81);Other abnormalities of gait and mobility (R26.89);History of falling (Z91.81);Muscle weakness (generalized) (M62.81);Difficulty in walking, not elsewhere classified (R26.2);Pain Pain - part of body:  (back)     Time: 8921-1941 PT Time Calculation (min) (ACUTE ONLY): 18 min  Charges:  $Gait Training: 8-22 mins                     Greggory Stallion, PT, DPT, GCS [phone]    Shirley Evans 08/29/2021, 3:20 PM

## 2021-08-29 NOTE — Progress Notes (Signed)
Progress Note   Patient: Shirley Evans DOB: 26-Apr-1965 DOA: 08/24/2021     4 DOS: the patient was seen and examined on 08/29/2021   Brief hospital course: 56 year old female with past medical history of COPD, GERD, hypertension, alcohol abuse with chronic nausea vomiting likely due to gastritis presented to the ED on early morning 08/25/2021 with complaints of recurrent nausea vomiting, mild diarrhea in addition to severe back pain.  ED course --hypertensive 170/133, tachycardic and tachypneic but afebrile.  Labs showed electrolyte abnormalities with hyponatremia, hypochloremia, hypokalemia and acute kidney injury with creatinine 2.49 up from 0.68 on 08/12/2021.  LFTs were elevated but improved.  Imaging --abdominal pelvic CT showed acute fracture deformities involving T10, T11, T12 and L3.  Subsequent upright thoracic plain films confirmed the same compression fractures.  Neurosurgery was consulted.  No intervention or further imaging was recommended.  They recommended TLSO brace when out of bed, PT OT and pain control.  Patient's nausea vomiting diarrhea have improved.  She states these are chronic, and come and go at baseline.  Assessment and Plan: * AKI (acute kidney injury) ([place]) Likely prerenal azotemia in the setting of nausea vomiting diarrhea.  Creatinine on admission was 2.49, up from 0.68 on 5/20. AKI resolved with IV hydration. Cr: 0.91 --Off IV fluids --Monitor BMP  Sepsis ([place]) Severe sepsis was present on admission as evidenced by leukocytosis, tachycardia and tachypnea, elevated lactic acid consistent with organ dysfunction in the setting of acute GI illness felt to likely be infectious. Treated with broad-spectrum antibiotics in the ED, continued on cefepime and Flagyl on admission.  Cefepime was de-escalated to Rocephin on 6/2. -- Continue Rocephin and Flagyl -- Follow cultures -no growth to date -- Supportive care with IV hydration, antiemetics,  PPI    Vertebral fracture, osteoporotic, initial encounter [facility]) Neurosurgery was consulted.  No intervention is recommended, conservative management only. --TLSO brace when out of bed -patient noncompliant with this --Pain control -scheduled Tylenol, lidocaine patches, as needed oxycodone, IV morphine for breakthrough --Neurosurgery follow-up outpatient in 3 weeks -- PT and OT evaluations -SNF recommended, patient declines.  Home with home health PT OT --Repeat x-rays of thoracic and lumbar spine today are unchanged.  Obtained because patient "heard something pop" in her back and continues to report uncontrolled pain    Hypokalemia Potassium was replaced on admission. K today 4.8 -- Monitor BMP, replace K as needed  Acute gastroenteritis Unclear if infectious etiology or acutely flared gastritis related to her alcohol abuse. See sepsis for plan  Hypomagnesemia Severe, initial magnesium 0.6. Recurrent/persistent, replacing daily.  Mg today: 1.1 >> 1.5 despite 4 g IV Mg yesterday. --4 g IV mag sulfate again today -- Monitor and replace Mg as needed  Dyslipidemia Continue statin  Chronic diarrhea Suspect some degree of chronic pancreatitis given her alcoholism. --Metamucil --Imodium PRM --GI follow up outpatient  Hypophosphatemia Phosphorus 2.0 (6/4). --Phos-NaK packets x 1 day -- Repeat phosphorus level in the morning  Thrombocytopenia ([facility]) Platelet count was normal on admission 341, downtrending since admission 197 >> 141 >> 101k today. Suspect due to sepsis, likely has some component of myelosuppression due to her alcohol abuse -- Monitor CBC daily -- Continue Lovenox for now, risk of HIT relatively low -- If downtrend continues, stop Lovenox and use SCDs  Depression Continue Zoloft and Wellbutrin  Hypertension Increased lisinopril. IV labetalol as needed        Subjective: Patient was resting comfortably in bed when I entered on rounds this morning.  She reports ongoing diarrhea, just took Imodium this morning.  We discussed her persistently low magnesium.  The longer we were talking, more she increasingly began to moan and become restless, writhing in the bed, complaining of uncontrolled pain.  Physical Exam: Vitals:   08/29/21 0627 08/29/21 0805 08/29/21 1129 08/29/21 1512  BP: (!) 158/107 (!) 150/98 (!) 156/106 108/86  Pulse: 66 69 68 74  Resp: '17 17 17 18  '$ Temp: 97.7 F (36.5 C) 97.8 F (36.6 C) (!) 97.4 F (36.3 C) 98 F (36.7 C)  TempSrc:      SpO2: 94% 95% 91% 93%  Weight:      Height:       General exam: awake, alert, no acute distress, appears comfortable early in the encounter and increasingly writhing and moaning in pain as we talk Respiratory system: Normal respiratory effort on room air,, lungs clear without wheezing. Cardiovascular system: RRR, no peripheral edema.   Central nervous system: A&O x4. no gross focal neurologic deficits, normal speech Extremities: moves all, no edema, normal tone Psychiatry: normal mood, restless congruent affect  Data Reviewed: Notable labs: Glucose 107, magnesium 1.5, albumin 3.1, AST 90 improving, ALT 64, total protein 6.3, Platelets 129 improving  Thoracic and lumbar spinal x-rays today unchanged from prior   Family Communication: None  Disposition: Status is: Inpatient Remains inpatient appropriate because: Pain uncontrolled, ongoing electrolyte derangements requiring close monitoring and replacement   Planned Discharge Destination: Home with home health, patient declines SNF as recommended      Time spent: 35 minutes  Author: Ezekiel Slocumb, DO 08/29/2021 4:47 PM  For on call review [email or web address].

## 2021-08-30 DIAGNOSIS — N179 Acute kidney failure, unspecified: Secondary | ICD-10-CM | POA: Diagnosis not present

## 2021-08-30 LAB — BASIC METABOLIC PANEL
Anion gap: 7 (ref 5–15)
BUN: 11 mg/dL (ref 6–20)
CO2: 29 mmol/L (ref 22–32)
Calcium: 9.8 mg/dL (ref 8.9–10.3)
Chloride: 99 mmol/L (ref 98–111)
Creatinine, Ser: 0.98 mg/dL (ref 0.44–1.00)
GFR, Estimated: >60 mL/min (ref >60)
Glucose, Bld: 123 mg/dL — ABNORMAL HIGH (ref 70–99)
Potassium: 4.6 mmol/L (ref 3.5–5.1)
Sodium: 135 mmol/L (ref 135–145)

## 2021-08-30 LAB — CULTURE, BLOOD (ROUTINE X 2)
Culture: NO GROWTH
Culture: NO GROWTH
Special Requests: ADEQUATE
Special Requests: ADEQUATE

## 2021-08-30 LAB — CBC
HCT: 42.6 % (ref 36.0–46.0)
Hemoglobin: 14.2 g/dL (ref 12.0–15.0)
MCH: 34.5 pg — ABNORMAL HIGH (ref 26.0–34.0)
MCHC: 33.3 g/dL (ref 30.0–36.0)
MCV: 103.4 fL — ABNORMAL HIGH (ref 80.0–100.0)
Platelets: 164 10*3/uL (ref 150–400)
RBC: 4.12 MIL/uL (ref 3.87–5.11)
RDW: 14.4 % (ref 11.5–15.5)
WBC: 7.8 10*3/uL (ref 4.0–10.5)
nRBC: 0 % (ref 0.0–0.2)

## 2021-08-30 LAB — MAGNESIUM: Magnesium: 2 mg/dL (ref 1.7–2.4)

## 2021-08-30 MED ORDER — OXYCODONE HCL 5 MG PO TABS
5.0000 mg | ORAL_TABLET | ORAL | Status: DC | PRN
  Administered 2021-08-30 – 2021-09-01 (×8): 10 mg via ORAL
  Filled 2021-08-30 (×9): qty 2

## 2021-08-30 MED ORDER — ACETAMINOPHEN 500 MG PO TABS: 500.0000 mg | ORAL_TABLET | ORAL | Status: DC | PRN

## 2021-08-30 MED ORDER — PANCRELIPASE (LIP-PROT-AMYL) 12000-38000 UNITS PO CPEP
12000.0000 [IU] | ORAL_CAPSULE | Freq: Three times a day (TID) | ORAL | Status: DC
  Administered 2021-08-30 – 2021-09-01 (×5): 12000 [IU] via ORAL
  Filled 2021-08-30 (×6): qty 1

## 2021-08-30 MED ORDER — ACETAMINOPHEN 325 MG PO TABS: 650.0000 mg | ORAL_TABLET | ORAL | Status: DC | PRN

## 2021-08-30 NOTE — Plan of Care (Signed)

## 2021-08-30 NOTE — Progress Notes (Signed)
PT Cancellation Note  Patient Details Name: Shirley Evans MRN: [id number] DOB: March 04, 1966   Cancelled Treatment:    Reason Eval/Treat Not Completed: Pain limiting ability to participate. Reports she received pain medicine just now, will re-attempt later as able.    Oliva Montecalvo 08/30/2021, 11:02 AM

## 2021-08-30 NOTE — Progress Notes (Signed)
Shirley Evans  UXN:[id number] DOB: October 15, 1965 DOA: 08/24/2021 PCP: Romualdo Bolk, FNP    Brief Narrative:  56 year old with a history of COPD, GERD, HTN, alcohol abuse, and chronic nausea with vomiting felt to be due to chronic gastritis who presented to the ED 08/25/2021 with recurrent nausea vomiting and mild diarrhea in addition to severe back pain.  She was found to be hyponatremic hypochloremic and hypokalemic and also suffering with acute kidney injury.  Creatinine at presentation was 2.49 up from a baseline of 0.68.  CT abdomen and pelvis revealed acute fracture deformities involving T10, T11, T12, and L3.  Neurosurgery was consulted and did not feel that acute intervention was indicated recommending instead TLSO brace with PT and OT.   Consultants:  Neurosurgery  Goals of Care:  Code Status: Full Code   DVT prophylaxis: Lovenox  Interim Hx: Laying in bed at the time of my visit.  Reports very poorly controlled back pain.  Denies chest pain or shortness of breath.  Reports no nausea or vomiting.  Reports good intake.  Reports persistence of chronic diarrhea, which has not changed in character from her baseline.  Assessment & Plan:  AKI (acute kidney injury)  prerenal azotemia in the setting of nausea vomiting diarrhea -baseline creatinine 0.68 on 5/20 -resolved with IV fluid resuscitation -creatinine stable in normal range  SIRS - Sepsis ruled out SIRS was present on admission as evidenced by leukocytosis, tachycardia and tachypnea in the setting diarrhea, but diarrhea on further hx appears to be a chronic issue and not indicative of an acute colitis/gastroenteritis - I suspect this represented SIRS at presentation due to acute DH, and not actual sepsis   Vertebral fracture, osteoporotic Neurosurgery was consulted - no surgical intervention is recommended -TLSO brace when out of bed  -Pain control -scheduled Tylenol, lidocaine patches, as needed oxycodone -Neurosurgery  follow-up outpatient in 3 weeks -PT and OT have followed during hospital stay and initially recommended SNF rehab stay which patient declined -she has now been cleared for home health therapy -F/u x-rays of thoracic and lumbar spine are unchanged    Hypokalemia Corrected with supplementation  Acute gastroenteritis Likely acute flare of her chronic gastritis which is felt to be alcohol related - not consistent with an infectious issue   Hypomagnesemia Likely due to poor intake in the setting of alcoholism -corrected with supplementation -we will need to recheck in a.m. as it has been quite resistant to correction  Dyslipidemia Continue statin  Chronic diarrhea Suspect some degree of chronic pancreatitis given her alcoholism - trial of creon  Hypophosphatemia Due to alcoholism -recheck in a.m.  Thrombocytopenia Likely chronic related to alcoholism as well as poor nutritional state -has stabilized at this time in absence of alcohol  Depression Continue Zoloft and Wellbutrin  Hypertension Blood pressure reasonably controlled at this time -further titration of medications likely to required an outpatient follow-up  Family Communication: No family present at time of exam Disposition: Anticipate discharge home with [facility] PT/OT 08/31/2021   Objective: Blood pressure (!) 138/91, pulse 73, temperature 97.6 F (36.4 C), resp. rate 15, height '5\' 4"'$  (2.542 m), weight 65.8 kg, SpO2 90 %.  Intake/Output Summary (Last 24 hours) at 08/30/2021 1354 Last data filed at 08/30/2021 0930 Gross per 24 hour  Intake 120 ml  Output --  Net 120 ml   Filed Weights   08/24/21 1928  Weight: 65.8 kg    Examination: General: No acute respiratory distress Lungs: Clear to auscultation bilaterally without  wheezes or crackles Cardiovascular: Regular rate and rhythm without murmur gallop or rub normal S1 and S2 Abdomen: Nontender, nondistended, soft, bowel sounds positive, no rebound, no ascites, no  appreciable mass Extremities: No significant cyanosis, clubbing, or edema bilateral lower extremities  CBC: Recent Labs  Lab 08/28/21 0548 08/29/21 0525 08/30/21 0352  WBC 7.4 6.7 7.8  HGB 13.4 12.8 14.2  HCT 39.6 37.2 42.6  MCV 102.3* 101.9* 103.4*  PLT 109* 129* 546   Basic Metabolic Panel: Recent Labs  Lab 08/27/21 0411 08/28/21 0548 08/29/21 0525 08/30/21 0352  NA 136 138 135 135  K 4.8 4.7 4.4 4.6  CL 108 105 101 99  CO2 '24 27 25 29  '$ GLUCOSE 101* 90 107* 123*  BUN '16 11 11 11  '$ CREATININE 0.77 0.72 0.91 0.98  CALCIUM 8.0* 9.5 9.4 9.8  MG 1.5* 1.1* 1.5* 2.0  PHOS 2.0*  --  4.4  --    GFR: Estimated Creatinine Clearance: 59.1 mL/min (by C-G formula based on SCr of 0.98 mg/dL).  Liver Function Tests: Recent Labs  Lab 08/24/21 1933 08/27/21 0411 08/28/21 0548 08/29/21 0525  AST 113* 100* 95* 90*  ALT 75* 58* 64* 64*  ALKPHOS 108 75 86 91  BILITOT 3.5* 0.8 0.7 0.8  PROT 9.0* 6.3* 6.6 6.3*  ALBUMIN 4.7 3.2* 3.3* 3.1*    Scheduled Meds:  acetaminophen  1,000 mg Oral Q8H   Or   acetaminophen  650 mg Rectal Q8H   buPROPion  75 mg Oral TID PC   cholecalciferol  1,000 Units Oral Daily   cyclobenzaprine  10 mg Oral Daily   enoxaparin (LOVENOX) injection  40 mg Subcutaneous Q24H   gabapentin  300 mg Oral BID   hydrochlorothiazide  12.5 mg Oral Daily   lidocaine  2 patch Transdermal Q24H   lisinopril  40 mg Oral Daily   pantoprazole  40 mg Oral Daily   psyllium  1 packet Oral Daily   rosuvastatin  40 mg Oral Daily   sertraline  100 mg Oral Daily   valACYclovir  1,000 mg Oral Daily      LOS: 5 days   Cherene Altes, MD [facility] Office  [phone] Pager - Text Page per Shea Evans  If 7PM-7AM, please contact night-coverage per Amion 08/30/2021, 1:54 PM

## 2021-08-30 NOTE — Progress Notes (Signed)
Physical Therapy Treatment Patient Details Name: Shirley Evans MRN: [id number] DOB: 30-Jan-1966 Today's Date: 08/30/2021   History of Present Illness 56 y.o. female admitted to hospital with onset of recurrent nausea and vomiting since Monday with associated mild diarrhea. Medical history significant for COPD, GERD, hypertension, alcohol abuse and chronic nausea and vomiting. Found to have acute compression fracture involving T10, T11, T12 and L3; does not remember a fall.    PT Comments    Patient received in bed, she is agreeable to PT session. Patient is ambulatory in room independently. Getting up and going to bathroom by herself. She is mod independent with bed mobility, transfers without assist. Ambulated 300 feet with RW and supervision. Patient limited by pain. Requires min A to don TLSO. She will continue to benefit from skilled PT to maximize independence.      Recommendations for follow up therapy are one component of a multi-disciplinary discharge planning process, led by the attending physician.  Recommendations may be updated based on patient status, additional functional criteria and insurance authorization.  Follow Up Recommendations  Home health PT     Assistance Recommended at Discharge Intermittent Supervision/Assistance  Patient can return home with the following A little help with bathing/dressing/bathroom;Assist for transportation;Help with stairs or ramp for entrance;Assistance with cooking/housework   Equipment Recommendations  None recommended by PT    Recommendations for Other Services       Precautions / Restrictions Precautions Precautions: Fall;Back Precaution Booklet Issued: Yes (comment) Required Braces or Orthoses: Spinal Brace Spinal Brace: Thoracolumbosacral orthotic;Applied in sitting position Restrictions Weight Bearing Restrictions: No     Mobility  Bed Mobility Overal bed mobility: Modified Independent Bed Mobility: Rolling, Sidelying to  Sit, Sit to Supine Rolling: Modified independent (Device/Increase time) Sidelying to sit: Modified independent (Device/Increase time)   Sit to supine: Modified independent (Device/Increase time)        Transfers Overall transfer level: Modified independent Equipment used: Rolling walker (2 wheels) Transfers: Sit to/from Stand Sit to Stand: Modified independent (Device/Increase time)                Ambulation/Gait Ambulation/Gait assistance: Supervision Gait Distance (Feet): 300 Feet Assistive device: Rolling walker (2 wheels) Gait Pattern/deviations: Step-through pattern, Decreased step length - right, Decreased step length - left Gait velocity: decreased     General Gait Details: reliance on the RW for pain relief. TLSO donned prior to mobility. Reports 10/10 pain with exertion.   Stairs             Wheelchair Mobility    Modified Rankin (Stroke Patients Only)       Balance Overall balance assessment: Modified Independent Sitting-balance support: Feet supported Sitting balance-Leahy Scale: Good Sitting balance - Comments: Pain   Standing balance support: Bilateral upper extremity supported, During functional activity Standing balance-Leahy Scale: Good Standing balance comment: pain with all mobility                            Cognition Arousal/Alertness: Awake/alert Behavior During Therapy: WFL for tasks assessed/performed Overall Cognitive Status: Within Functional Limits for tasks assessed                                          Exercises      General Comments        Pertinent Vitals/Pain Pain Assessment Pain Assessment: 0-10  Pain Score: 10-Worst pain ever Pain Location: mid back Pain Descriptors / Indicators: Discomfort, Dull, Grimacing, Guarding, Moaning Pain Intervention(s): Monitored during session, Repositioned    Home Living                          Prior Function            PT  Goals (current goals can now be found in the care plan section) Acute Rehab PT Goals Patient Stated Goal: to decrease pain PT Goal Formulation: With patient Time For Goal Achievement: 09/08/21 Potential to Achieve Goals: Fair Progress towards PT goals: Not progressing toward goals - comment (continues to report 10/10 pain)    Frequency    Min 2X/week      PT Plan Current plan remains appropriate    Co-evaluation              AM-PAC PT "6 Clicks" Mobility   Outcome Measure  Help needed turning from your back to your side while in a flat bed without using bedrails?: None Help needed moving from lying on your back to sitting on the side of a flat bed without using bedrails?: None Help needed moving to and from a bed to a chair (including a wheelchair)?: None Help needed standing up from a chair using your arms (e.g., wheelchair or bedside chair)?: None Help needed to walk in hospital room?: None Help needed climbing 3-5 steps with a railing? : A Little 6 Click Score: 23    End of Session Equipment Utilized During Treatment: Back brace Activity Tolerance: Patient limited by pain Patient left: in bed;with call bell/phone within reach Nurse Communication: Mobility status PT Visit Diagnosis: Other abnormalities of gait and mobility (R26.89);History of falling (Z91.81);Difficulty in walking, not elsewhere classified (R26.2);Pain Pain - part of body:  (back)     Time: 1610-9604 PT Time Calculation (min) (ACUTE ONLY): 10 min  Charges:  $Gait Training: 8-22 mins                     Khianna Blazina, PT, GCS 08/30/21,2:55 PM

## 2021-08-31 DIAGNOSIS — N179 Acute kidney failure, unspecified: Secondary | ICD-10-CM | POA: Diagnosis not present

## 2021-08-31 LAB — COMPREHENSIVE METABOLIC PANEL
ALT: 85 U/L — ABNORMAL HIGH (ref 0–44)
AST: 115 U/L — ABNORMAL HIGH (ref 15–41)
Albumin: 3.8 g/dL (ref 3.5–5.0)
Alkaline Phosphatase: 114 U/L (ref 38–126)
Anion gap: 8 (ref 5–15)
BUN: 12 mg/dL (ref 6–20)
CO2: 29 mmol/L (ref 22–32)
Calcium: 10.1 mg/dL (ref 8.9–10.3)
Chloride: 98 mmol/L (ref 98–111)
Creatinine, Ser: 0.74 mg/dL (ref 0.44–1.00)
GFR, Estimated: >60 mL/min (ref >60)
Glucose, Bld: 87 mg/dL (ref 70–99)
Potassium: 4.5 mmol/L (ref 3.5–5.1)
Sodium: 135 mmol/L (ref 135–145)
Total Bilirubin: 0.8 mg/dL (ref 0.3–1.2)
Total Protein: 7.4 g/dL (ref 6.5–8.1)

## 2021-08-31 LAB — CBC
HCT: 43.5 % (ref 36.0–46.0)
Hemoglobin: 14.5 g/dL (ref 12.0–15.0)
MCH: 34.1 pg — ABNORMAL HIGH (ref 26.0–34.0)
MCHC: 33.3 g/dL (ref 30.0–36.0)
MCV: 102.4 fL — ABNORMAL HIGH (ref 80.0–100.0)
Platelets: 208 10*3/uL (ref 150–400)
RBC: 4.25 MIL/uL (ref 3.87–5.11)
RDW: 14.1 % (ref 11.5–15.5)
WBC: 9 10*3/uL (ref 4.0–10.5)
nRBC: 0 % (ref 0.0–0.2)

## 2021-08-31 LAB — MAGNESIUM
Magnesium: 1.4 mg/dL — ABNORMAL LOW (ref 1.7–2.4)
Magnesium: 3.4 mg/dL — ABNORMAL HIGH (ref 1.7–2.4)

## 2021-08-31 LAB — PHOSPHORUS: Phosphorus: 4.6 mg/dL (ref 2.5–4.6)

## 2021-08-31 MED ORDER — MAGNESIUM GLUCONATE 500 MG PO TABS
500.0000 mg | ORAL_TABLET | Freq: Two times a day (BID) | ORAL | Status: DC
  Administered 2021-08-31 – 2021-09-01 (×3): 500 mg via ORAL
  Filled 2021-08-31 (×3): qty 1

## 2021-08-31 MED ORDER — CARVEDILOL 3.125 MG PO TABS
3.1250 mg | ORAL_TABLET | Freq: Two times a day (BID) | ORAL | Status: DC
  Administered 2021-08-31 – 2021-09-01 (×2): 3.125 mg via ORAL
  Filled 2021-08-31 (×2): qty 1

## 2021-08-31 MED ORDER — MAGNESIUM SULFATE 4 GM/100ML IV SOLN
4.0000 g | Freq: Once | INTRAVENOUS | Status: AC
Start: 2021-08-31 — End: 2021-08-31
  Administered 2021-08-31: 4 g via INTRAVENOUS
  Filled 2021-08-31: qty 100

## 2021-08-31 MED ORDER — IBUPROFEN 400 MG PO TABS
400.0000 mg | ORAL_TABLET | Freq: Three times a day (TID) | ORAL | Status: DC
Start: 2021-08-31 — End: 2021-09-01
  Administered 2021-08-31 – 2021-09-01 (×3): 400 mg via ORAL
  Filled 2021-08-31 (×3): qty 1

## 2021-08-31 MED ORDER — CLONIDINE HCL 0.1 MG PO TABS
0.1000 mg | ORAL_TABLET | Freq: Two times a day (BID) | ORAL | Status: DC
  Administered 2021-08-31 – 2021-09-01 (×2): 0.1 mg via ORAL
  Filled 2021-08-31 (×2): qty 1

## 2021-08-31 MED ORDER — TRAMADOL HCL 50 MG PO TABS
50.0000 mg | ORAL_TABLET | Freq: Four times a day (QID) | ORAL | Status: DC | PRN
  Administered 2021-08-31: 50 mg via ORAL
  Filled 2021-08-31: qty 1

## 2021-08-31 NOTE — Progress Notes (Signed)
Shirley Evans  LGX:[id number] DOB: Oct 22, 1965 DOA: 08/24/2021 PCP: Romualdo Bolk, FNP    Brief Narrative:  56 year old with a history of COPD, GERD, HTN, alcohol abuse, and chronic nausea with vomiting felt to be due to chronic alcoholic gastritis who presented to the ED 08/25/2021 with recurrent nausea vomiting and mild diarrhea in addition to severe back pain.  She was found to be hyponatremic hypochloremic and hypokalemic and also suffering with acute kidney injury.  Creatinine at presentation was 2.49 up from a baseline of 0.68.  CT abdomen and pelvis revealed acute fracture deformities involving T10, T11, T12, and L3.  Neurosurgery was consulted and did not feel that acute intervention was indicated recommending instead TLSO brace with PT and OT.  Consultants:  Neurosurgery  Goals of Care:  Code Status: Full Code   DVT prophylaxis: Lovenox  Interim Hx: Afebrile.  Vital signs stable.  Hypomagnesemia has recurred with magnesium 1.4 today.  Continues to report pain as 10 out of 10, though at time of exam there are no obvious outward signs of excruciating pain.  No respiratory distress reports fair appetite.  Assessment & Plan:  AKI (acute kidney injury) - resolved  prerenal azotemia in the setting of nausea vomiting diarrhea -baseline creatinine 0.68 on 5/20 -resolved with IV fluid resuscitation -creatinine stable in normal range  SIRS - Sepsis ruled out SIRS was present on admission as evidenced by leukocytosis, tachycardia and tachypnea in the setting diarrhea, but diarrhea on further hx appears to be a chronic issue and not indicative of an acute colitis/gastroenteritis - I suspect this represented SIRS at presentation due to acute DH, and not actual sepsis   Vertebral fracture, osteoporotic Neurosurgery was consulted - no surgical intervention was recommended -TLSO brace when out of bed  -Pain control -scheduled Tylenol, lidocaine patches, as needed oxycodone -Neurosurgery  follow-up outpatient in 3 weeks -PT and OT have followed during hospital stay and initially recommended SNF rehab stay which patient declined -she has now been cleared for home health therapy -F/u x-rays of thoracic and lumbar spine are unchanged    Hypokalemia Corrected with supplementation and stable  Acute gastroenteritis v/s alcoholic gastritis  Likely acute flare of her chronic gastritis which is felt to be alcohol related - not consistent with an infectious issue   Hypomagnesemia Likely due to poor intake in the setting of alcoholism -remains resistant to consistent correction suggesting significant total body deficit likely due to alcoholism/poor nutrition -continue to supplement until stable  Dyslipidemia Continue statin  Chronic diarrhea Suspect some degree of chronic pancreatitis given her alcoholism - trial of creon ongoing  Hypophosphatemia Due to alcoholism -corrected  Thrombocytopenia Likely chronic related to alcoholism as well as poor nutritional state -now steadily climbing in the absence of alcohol  Depression Continue Zoloft and Wellbutrin  Hypertension Blood pressure poorly controlled and trending upward -medications titrated  Family Communication: No family present at time of exam Disposition: Anticipate discharge home with [facility] PT/OT 09/01/2021 if magnesium stable/improved   Objective: Blood pressure (!) 179/111, pulse 82, temperature 97.8 F (36.6 C), temperature source Oral, resp. rate 18, height '5\' 4"'$  (1.626 m), weight 65.8 kg, SpO2 96 %.  Intake/Output Summary (Last 24 hours) at 08/31/2021 1524 Last data filed at 08/31/2021 1023 Gross per 24 hour  Intake 100 ml  Output --  Net 100 ml   Filed Weights   08/24/21 1928  Weight: 65.8 kg    Examination: General: No acute respiratory distress Lungs: CTA B without wheezing  Cardiovascular: RRR without murmur Abdomen: NT/ND, soft, BS positive, no rebound Extremities: No significant cyanosis, clubbing,  or edema bilateral lower extremities  CBC: Recent Labs  Lab 08/29/21 0525 08/30/21 0352 08/31/21 0422  WBC 6.7 7.8 9.0  HGB 12.8 14.2 14.5  HCT 37.2 42.6 43.5  MCV 101.9* 103.4* 102.4*  PLT 129* 164 240   Basic Metabolic Panel: Recent Labs  Lab 08/27/21 0411 08/28/21 0548 08/29/21 0525 08/30/21 0352 08/31/21 0422 08/31/21 1355  NA 136   < > 135 135 135  --   K 4.8   < > 4.4 4.6 4.5  --   CL 108   < > 101 99 98  --   CO2 24   < > '25 29 29  '$ --   GLUCOSE 101*   < > 107* 123* 87  --   BUN 16   < > '11 11 12  '$ --   CREATININE 0.77   < > 0.91 0.98 0.74  --   CALCIUM 8.0*   < > 9.4 9.8 10.1  --   MG 1.5*   < > 1.5* 2.0 1.4* 3.4*  PHOS 2.0*  --  4.4  --  4.6  --    < > = values in this interval not displayed.   GFR: Estimated Creatinine Clearance: 72.4 mL/min (by C-G formula based on SCr of 0.74 mg/dL).  Liver Function Tests: Recent Labs  Lab 08/27/21 0411 08/28/21 0548 08/29/21 0525 08/31/21 0422  AST 100* 95* 90* 115*  ALT 58* 64* 64* 85*  ALKPHOS 75 86 91 114  BILITOT 0.8 0.7 0.8 0.8  PROT 6.3* 6.6 6.3* 7.4  ALBUMIN 3.2* 3.3* 3.1* 3.8    Scheduled Meds:  buPROPion  75 mg Oral TID PC   carvedilol  3.125 mg Oral BID WC   cholecalciferol  1,000 Units Oral Daily   cyclobenzaprine  10 mg Oral Daily   enoxaparin (LOVENOX) injection  40 mg Subcutaneous Q24H   gabapentin  300 mg Oral BID   lidocaine  2 patch Transdermal Q24H   lipase/protease/amylase  12,000 Units Oral TID AC   lisinopril  40 mg Oral Daily   magnesium gluconate  500 mg Oral BID   pantoprazole  40 mg Oral Daily   psyllium  1 packet Oral Daily   rosuvastatin  40 mg Oral Daily   sertraline  100 mg Oral Daily   valACYclovir  1,000 mg Oral Daily      LOS: 6 days   Cherene Altes, MD [facility] Office  [phone] Pager - Text Page per Shea Evans  If 7PM-7AM, please contact night-coverage per Amion 08/31/2021, 3:24 PM

## 2021-08-31 NOTE — Care Management Important Message (Signed)
Important Message  Patient Details  Name: Shirley Evans MRN: [id number] Date of Birth: 06/04/1965   Medicare Important Message Given:  Yes     Juliann Pulse A Sanika Brosious 08/31/2021, 11:38 AM

## 2021-08-31 NOTE — Progress Notes (Signed)
Physical Therapy Treatment Patient Details Name: Shirley Evans MRN: [id number] DOB: 06/21/1965 Today's Date: 08/31/2021   History of Present Illness 56 y.o. female admitted to hospital with onset of recurrent nausea and vomiting since Monday with associated mild diarrhea. Medical history significant for COPD, GERD, hypertension, alcohol abuse and chronic nausea and vomiting. Found to have acute compression fracture involving T10, T11, T12 and L3; does not remember a fall.    PT Comments    Patient received in bed, she reports she is feeling worse today. Agreeable to PT session. Patient requires assistance to don/doff TLSO. Otherwise independent with functional mobility. Supervision only. She is ambulating to/from bathroom regularly without assistance. Patient will benefit from continued skilled PT to maximize mobility and maintain strength.      Recommendations for follow up therapy are one component of a multi-disciplinary discharge planning process, led by the attending physician.  Recommendations may be updated based on patient status, additional functional criteria and insurance authorization.  Follow Up Recommendations  Home health PT     Assistance Recommended at Discharge Intermittent Supervision/Assistance  Patient can return home with the following A little help with bathing/dressing/bathroom;Assist for transportation;Help with stairs or ramp for entrance;Assistance with cooking/housework   Equipment Recommendations  Rolling walker (2 wheels);Other (comment) (states she does not think her walker at home has wheels)    Recommendations for Other Services       Precautions / Restrictions Precautions Precautions: Fall;Back Precaution Booklet Issued: Yes (comment) Required Braces or Orthoses: Spinal Brace Spinal Brace: Thoracolumbosacral orthotic;Applied in standing position Restrictions Weight Bearing Restrictions: No Other Position/Activity Restrictions: TLSO when OOB      Mobility  Bed Mobility Overal bed mobility: Modified Independent Bed Mobility: Supine to Sit, Sit to Supine Rolling: Modified independent (Device/Increase time) Sidelying to sit: Modified independent (Device/Increase time) Supine to sit: Modified independent (Device/Increase time) Sit to supine: Modified independent (Device/Increase time)   General bed mobility comments: Demonstrates rolling to side and pushing up to sitting. Continues to require assistance donning/doffing TLSO    Transfers Overall transfer level: Modified independent Equipment used: None Transfers: Sit to/from Stand Sit to Stand: Modified independent (Device/Increase time)                Ambulation/Gait Ambulation/Gait assistance: Supervision Gait Distance (Feet): 150 Feet Assistive device: Rolling walker (2 wheels) Gait Pattern/deviations: Step-through pattern, Decreased step length - right, Decreased step length - left Gait velocity: decreased     General Gait Details: reliance on the RW for pain relief. TLSO donned prior to mobility. Reports 10/10 pain with exertion.   Stairs             Wheelchair Mobility    Modified Rankin (Stroke Patients Only)       Balance Overall balance assessment: Modified Independent Sitting-balance support: Feet supported Sitting balance-Leahy Scale: Good Sitting balance - Comments: Pain   Standing balance support: Bilateral upper extremity supported, During functional activity Standing balance-Leahy Scale: Good Standing balance comment: pain with all mobility                            Cognition Arousal/Alertness: Awake/alert Behavior During Therapy: WFL for tasks assessed/performed Overall Cognitive Status: Within Functional Limits for tasks assessed  Exercises      General Comments        Pertinent Vitals/Pain Pain Assessment Pain Assessment: 0-10 Pain Score:  10-Worst pain ever Pain Location: mid back Pain Descriptors / Indicators: Discomfort, Dull, Grimacing, Guarding, Moaning Pain Intervention(s): Monitored during session, Repositioned, Patient requesting pain meds-RN notified    Home Living                          Prior Function            PT Goals (current goals can now be found in the care plan section) Acute Rehab PT Goals Patient Stated Goal: to decrease pain PT Goal Formulation: With patient Time For Goal Achievement: 09/08/21 Potential to Achieve Goals: Fair Progress towards PT goals: Not progressing toward goals - comment (continues to have severe pain)    Frequency    Min 2X/week      PT Plan Current plan remains appropriate    Co-evaluation              AM-PAC PT "6 Clicks" Mobility   Outcome Measure  Help needed turning from your back to your side while in a flat bed without using bedrails?: None Help needed moving from lying on your back to sitting on the side of a flat bed without using bedrails?: None Help needed moving to and from a bed to a chair (including a wheelchair)?: None Help needed standing up from a chair using your arms (e.g., wheelchair or bedside chair)?: None Help needed to walk in hospital room?: None Help needed climbing 3-5 steps with a railing? : A Little 6 Click Score: 23    End of Session Equipment Utilized During Treatment: Back brace Activity Tolerance: Patient limited by pain Patient left: in bed;with call bell/phone within reach Nurse Communication: Mobility status;Patient requests pain meds PT Visit Diagnosis: Other abnormalities of gait and mobility (R26.89);Pain;Difficulty in walking, not elsewhere classified (R26.2) Pain - part of body:  (Back)     Time: 9326-7124 PT Time Calculation (min) (ACUTE ONLY): 14 min  Charges:  $Gait Training: 8-22 mins                     Mirren Gest, PT, GCS 08/31/21,11:30 AM

## 2021-09-01 DIAGNOSIS — N179 Acute kidney failure, unspecified: Secondary | ICD-10-CM | POA: Diagnosis not present

## 2021-09-01 LAB — BASIC METABOLIC PANEL
Anion gap: 7 (ref 5–15)
BUN: 18 mg/dL (ref 6–20)
CO2: 30 mmol/L (ref 22–32)
Calcium: 9.9 mg/dL (ref 8.9–10.3)
Chloride: 98 mmol/L (ref 98–111)
Creatinine, Ser: 1.05 mg/dL — ABNORMAL HIGH (ref 0.44–1.00)
GFR, Estimated: >60 mL/min (ref >60)
Glucose, Bld: 119 mg/dL — ABNORMAL HIGH (ref 70–99)
Potassium: 4.3 mmol/L (ref 3.5–5.1)
Sodium: 135 mmol/L (ref 135–145)

## 2021-09-01 LAB — MAGNESIUM: Magnesium: 2 mg/dL (ref 1.7–2.4)

## 2021-09-01 MED ORDER — OXYCODONE HCL 5 MG PO TABS: 5.0000 mg | ORAL_TABLET | ORAL | 0 refills | Status: DC | PRN

## 2021-09-01 MED ORDER — MAGNESIUM GLUCONATE 500 MG PO TABS: 500.0000 mg | ORAL_TABLET | Freq: Two times a day (BID) | ORAL | 1 refills | Status: DC

## 2021-09-01 MED ORDER — CLONIDINE HCL 0.1 MG PO TABS: 0.1000 mg | ORAL_TABLET | Freq: Two times a day (BID) | ORAL | 1 refills | Status: DC

## 2021-09-01 MED ORDER — CARVEDILOL 3.125 MG PO TABS: 3.1250 mg | ORAL_TABLET | Freq: Two times a day (BID) | ORAL | 1 refills | Status: DC

## 2021-09-01 MED ORDER — LISINOPRIL 40 MG PO TABS: 40.0000 mg | ORAL_TABLET | Freq: Every day | ORAL | 1 refills | Status: DC

## 2021-09-01 MED ORDER — ACETAMINOPHEN 500 MG PO TABS: 500.0000 mg | ORAL_TABLET | ORAL | 0 refills | Status: DC | PRN

## 2021-09-01 NOTE — Plan of Care (Signed)

## 2021-09-01 NOTE — Discharge Summary (Signed)
DISCHARGE SUMMARY  Shirley Evans  MR#: [id number]  DOB:01-18-1966  Date of Admission: 08/24/2021 Date of Discharge: 09/01/2021  Attending Physician:Shirley Zavadil Hennie Duos, MD  Patient's CVE:LFYBOF, [name], FNP  Consults: Neurosurgery  Disposition: Discharge home with home health assistance  Follow-up Appts:  Follow-up Information     Haroldine Laws, MD. Schedule an appointment as soon as possible for a visit in 3 week(s).   Specialty: Neurosurgery Contact information: [place] [address] [phone]         Romualdo Bolk, FNP. Schedule an appointment as soon as possible for a visit.   Specialty: Nurse Practitioner Contact information: [address] [place] [address] [phone]                 Tests Needing Follow-up: -recheck K+ and Mg -assess BP control   Discharge Diagnoses: Vertebral fractures, osteoporotic Hypokalemia AKI (acute kidney injury) - resolved  SIRS - Sepsis ruled out Acute gastroenteritis v/s alcoholic gastritis  Hypomagnesemia Dyslipidemia Chronic diarrhea Hypophosphatemia Thrombocytopenia Depression Hypertension  Initial presentation: 56 year old with a history of COPD, GERD, HTN, alcohol abuse, and chronic nausea with vomiting felt to be due to chronic alcoholic gastritis who presented to the ED 08/25/2021 with recurrent nausea vomiting and mild diarrhea in addition to severe back pain.  She was found to be hyponatremic hypochloremic and hypokalemic and also suffering with acute kidney injury.  Creatinine at presentation was 2.49 up from a baseline of 0.68.  CT abdomen and pelvis revealed acute fracture deformities involving T10, T11, T12, and L3.  Neurosurgery was consulted and did not feel that acute intervention was indicated recommending instead TLSO brace with PT and OT.  Hospital Course:  Vertebral fractures T10, T11, T12, and L3 - osteoporotic Neurosurgery was consulted - no surgical  intervention was recommended -TLSO brace when out of bed  -Pain control -scheduled Tylenol, lidocaine patches, as needed oxycodone -Neurosurgery follow-up outpatient in 3 weeks -PT and OT have followed during hospital stay and initially recommended SNF rehab stay which patient declined -she has now been cleared for home health therapy -F/u x-rays of thoracic and lumbar spine were unchanged   AKI (acute kidney injury) - resolved  prerenal azotemia in the setting of nausea vomiting diarrhea -baseline creatinine 0.68 on 5/20 -resolved with IV fluid resuscitation -creatinine stable in normal range   SIRS - Sepsis ruled out SIRS was present on admission as evidenced by leukocytosis, tachycardia and tachypnea in the setting diarrhea, but diarrhea on further hx appears to be a chronic issue and not indicative of an acute colitis/gastroenteritis - I suspect this represented SIRS at presentation due to acute DH, and not actual sepsis    Hypokalemia Corrected with supplementation and stable   Acute gastroenteritis v/s alcoholic gastritis  Likely acute flare of her chronic gastritis which is felt to be alcohol related - not consistent with an infectious issue -tolerating regular diet at time of discharge    Hypomagnesemia Likely due to poor intake in the setting of alcoholism -remained resistant to consistent correction suggesting significant total body deficit likely due to alcoholism/poor nutrition -stabilized prior to discharge   Dyslipidemia Continue statin   Chronic diarrhea Suspect some degree of chronic pancreatitis given her alcoholism - trial of creon administered during hospital stay w/o benefit therefore it was not continued after d/c    Hypophosphatemia Due to alcoholism -corrected   Thrombocytopenia Likely chronic related to alcoholism as well as poor nutritional state -now steadily climbing in  the absence of alcohol   Depression Continue Zoloft and Wellbutrin    Hypertension Blood pressure controlled with titration of medications during this hospital stay  Allergies as of 09/01/2021       Reactions   Demerol [meperidine] Nausea Only, Nausea And Vomiting   Codeine Nausea Only        Medication List     STOP taking these medications    enoxaparin 40 MG/0.4ML injection Commonly known as: LOVENOX   lisinopril-hydrochlorothiazide 20-12.5 MG tablet Commonly known as: ZESTORETIC       TAKE these medications    acetaminophen 500 MG tablet Commonly known as: TYLENOL Take 1 tablet (500 mg total) by mouth every 4 (four) hours as needed for mild pain, fever or headache.   albuterol 108 (90 Base) MCG/ACT inhaler Commonly known as: VENTOLIN HFA Inhale into the lungs.   albuterol (2.5 MG/3ML) 0.083% nebulizer solution Commonly known as: PROVENTIL Inhale into the lungs.   buPROPion 75 MG tablet Commonly known as: WELLBUTRIN Take 75 mg by mouth 3 (three) times daily.   carvedilol 3.125 MG tablet Commonly known as: COREG Take 1 tablet (3.125 mg total) by mouth 2 (two) times daily with a meal.   cholecalciferol 25 MCG (1000 UNIT) tablet Commonly known as: VITAMIN D3 Take 1,000 Units by mouth daily.   cloNIDine 0.1 MG tablet Commonly known as: CATAPRES Take 1 tablet (0.1 mg total) by mouth 2 (two) times daily.   cyclobenzaprine 10 MG tablet Commonly known as: FLEXERIL Take 10 mg by mouth daily.   gabapentin 300 MG capsule Commonly known as: NEURONTIN Take 1 capsule by mouth in the morning and at bedtime.   lidocaine 5 % Commonly known as: [place] onto the skin.   lisinopril 40 MG tablet Commonly known as: ZESTRIL Take 1 tablet (40 mg total) by mouth daily. Start taking on: September 02, 2021   loperamide 2 MG capsule Commonly known as: IMODIUM Take 1 capsule (2 mg total) by mouth as needed for diarrhea or loose stools.   magnesium gluconate 500 MG tablet Commonly known as: MAGONATE Take 1 tablet (500 mg total) by  mouth 2 (two) times daily.   omeprazole 20 MG capsule Commonly known as: PRILOSEC Take 1 capsule (20 mg total) by mouth 2 (two) times daily before a meal.   oxyCODONE 5 MG immediate release tablet Commonly known as: Oxy IR/ROXICODONE Take 1-2 tablets (5-10 mg total) by mouth every 4 (four) hours as needed for severe pain.   rosuvastatin 40 MG tablet Commonly known as: CRESTOR Take 40 mg by mouth daily.   sertraline 100 MG tablet Commonly known as: ZOLOFT Take 100 mg by mouth daily.   valACYclovir 1000 MG tablet Commonly known as: VALTREX Take 1,000 mg by mouth daily.        Day of Discharge BP (!) 141/93 (BP Location: Left Arm)   Pulse 71   Temp 97.8 F (36.6 C) (Oral)   Resp 16   Ht '5\' 4"'$  (1.626 m)   Wt 65.8 kg   SpO2 94%   BMI 24.89 kg/m   Physical Exam: General: No acute respiratory distress Lungs: Clear to auscultation bilaterally without wheezes or crackles Cardiovascular: Regular rate and rhythm without murmur  Abdomen: Nontender, nondistended, soft, bowel sounds positive, no rebound Extremities: No significant cyanosis, clubbing, or edema bilateral lower extremities  Basic Metabolic Panel: Recent Labs  Lab 08/27/21 0411 08/28/21 0548 08/29/21 0525 08/30/21 0352 08/31/21 0422 08/31/21 1355 09/01/21 0416  NA 136 138  135 135 135  --  135  K 4.8 4.7 4.4 4.6 4.5  --  4.3  CL 108 105 101 99 98  --  98  CO2 '24 27 25 29 29  '$ --  30  GLUCOSE 101* 90 107* 123* 87  --  119*  BUN '16 11 11 11 12  '$ --  18  CREATININE 0.77 0.72 0.91 0.98 0.74  --  1.05*  CALCIUM 8.0* 9.5 9.4 9.8 10.1  --  9.9  MG 1.5* 1.1* 1.5* 2.0 1.4* 3.4* 2.0  PHOS 2.0*  --  4.4  --  4.6  --   --     Liver Function Tests: Recent Labs  Lab 08/27/21 0411 08/28/21 0548 08/29/21 0525 08/31/21 0422  AST 100* 95* 90* 115*  ALT 58* 64* 64* 85*  ALKPHOS 75 86 91 114  BILITOT 0.8 0.7 0.8 0.8  PROT 6.3* 6.6 6.3* 7.4  ALBUMIN 3.2* 3.3* 3.1* 3.8   CBC: Recent Labs  Lab 08/27/21 0411  08/28/21 0548 08/29/21 0525 08/30/21 0352 08/31/21 0422  WBC 8.2 7.4 6.7 7.8 9.0  HGB 13.0 13.4 12.8 14.2 14.5  HCT 38.7 39.6 37.2 42.6 43.5  MCV 101.8* 102.3* 101.9* 103.4* 102.4*  PLT 101* 109* 129* 164 208    Time spent in discharge (includes decision making & examination of pt): 35 minutes  09/01/2021, 11:01 AM   Cherene Altes, MD [facility] Office  [phone]

## 2021-09-25 ENCOUNTER — Other Ambulatory Visit: Payer: Self-pay | Admitting: Specialist

## 2021-09-25 DIAGNOSIS — R911 Solitary pulmonary nodule: Secondary | ICD-10-CM

## 2021-10-03 ENCOUNTER — Other Ambulatory Visit: Payer: Self-pay

## 2021-10-03 ENCOUNTER — Ambulatory Visit
Admission: RE | Admit: 2021-10-03 | Discharge: 2021-10-03 | Disposition: A | Discharge status: Home / Self Care | Payer: Medicare HMO | Attending: Nurse Practitioner | Admitting: Nurse Practitioner

## 2021-10-03 ENCOUNTER — Ambulatory Visit
Admission: RE | Admit: 2021-10-03 | Discharge: 2021-10-03 | Disposition: A | Discharge status: Home / Self Care | Payer: Medicare HMO | Source: Ambulatory Visit | Attending: Neurosurgery | Admitting: Neurosurgery

## 2021-10-03 DIAGNOSIS — Z8781 Personal history of (healed) traumatic fracture: Secondary | ICD-10-CM

## 2021-10-17 ENCOUNTER — Encounter: Payer: Self-pay | Admitting: Neurosurgery

## 2021-10-17 ENCOUNTER — Ambulatory Visit (INDEPENDENT_AMBULATORY_CARE_PROVIDER_SITE_OTHER): Payer: Medicare HMO | Admitting: Neurosurgery

## 2021-10-17 VITALS — BP 138/84 | Ht 64.0 in | Wt 152.0 lb

## 2021-10-17 DIAGNOSIS — S22080D Wedge compression fracture of T11-T12 vertebra, subsequent encounter for fracture with routine healing: Secondary | ICD-10-CM

## 2021-10-17 DIAGNOSIS — S32030D Wedge compression fracture of third lumbar vertebra, subsequent encounter for fracture with routine healing: Secondary | ICD-10-CM | POA: Diagnosis not present

## 2021-10-17 DIAGNOSIS — S3282XD Multiple fractures of pelvis without disruption of pelvic ring, subsequent encounter for fracture with routine healing: Secondary | ICD-10-CM

## 2021-10-17 DIAGNOSIS — M5416 Radiculopathy, lumbar region: Secondary | ICD-10-CM

## 2021-10-17 NOTE — Progress Notes (Signed)
Follow-up note: Referring Physician:  Cherene Altes, MD [address] [place],  [place] [address]  Primary Physician:  Romualdo Bolk, FNP  Chief Complaint:  hospital f/u for compression fractures   History of Present Illness: Shirley Evans is a 56 y.o. female who presents for hospital f/u of fractures. She was initially seen by Dr. Tamala Julian on 08/27/21.  It was recommended that she be treated conservatively in an TLSO brace when out of bed and follow-up in 3 weeks with x-rays. Today she reports ongoing back pain. She has not been wearing her TLSO brace as she states it is heavy and she is unable to get it on and off on her own.  In addition to mid and lower back pain she endorses pain from her left hip to her knee with hypersensitivity.  She states that this been going on since her hip replacement with Dr. Rudene Christians but has worsened.  Review of Systems:  A 10 point review of systems is negative,  and the pertinent positives and negatives detailed in the HPI.  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   albuterol (VENTOLIN HFA) 108 (90 Base) MCG/ACT inhaler Inhale into the lungs.   carvedilol (COREG) 3.125 MG tablet Take 1 tablet (3.125 mg total) by mouth 2 (two) times daily with a meal.   cholecalciferol (VITAMIN D3) 25 MCG (1000 UNIT) tablet Take 1,000 Units by mouth daily.   cloNIDine (CATAPRES) 0.1 MG tablet Take 1 tablet (0.1 mg total) by mouth 2 (two) times daily.   cyclobenzaprine (FLEXERIL) 10 MG tablet Take 10 mg by mouth daily.   gabapentin (NEURONTIN) 300 MG capsule Take 1 capsule by mouth in the morning and at bedtime.   lidocaine (LIDODERM) 5 % Place onto the skin.   lisinopril (ZESTRIL) 40 MG tablet Take 1 tablet (40 mg total) by mouth daily.   loperamide (IMODIUM) 2 MG capsule Take 1 capsule (2 mg total) by mouth as needed for diarrhea or loose stools.   magnesium gluconate (MAGONATE) 500 MG tablet Take 1 tablet (500 mg total) by mouth 2 (two) times daily.   omeprazole (PRILOSEC) 20 MG capsule Take 1 capsule (20 mg total) by mouth 2 (two) times daily before a meal.   oxyCODONE (OXY IR/ROXICODONE) 5 MG immediate release tablet Take 1-2 tablets (5-10 mg total) by mouth every 4 (four) hours as needed for severe pain.   sertraline (ZOLOFT) 100 MG tablet  Take 100 mg by mouth daily.   valACYclovir (VALTREX) 1000 MG tablet Take 1,000 mg by mouth daily.   albuterol (PROVENTIL) (2.5 MG/3ML) 0.083% nebulizer solution Inhale into the lungs.   buPROPion (WELLBUTRIN) 75 MG tablet Take 75 mg by mouth 3 (three) times daily.   rosuvastatin (CRESTOR) 40 MG tablet Take 40 mg by mouth daily.   No facility-administered encounter medications on file as of 10/17/2021.    Social History: Social History   Tobacco Use   Smoking status: Former    Packs/day: 1.00    Years: 30.00    Total pack years: 30.00    Types: Cigarettes   Smokeless tobacco: Never  Vaping Use    Vaping Use: Never used  Substance Use Topics   Alcohol use: Yes    Alcohol/week: 0.0 standard drinks of alcohol   Drug use: Yes    Types: Marijuana    Comment: occasion recreational use    Family Medical History: Family History  Problem Relation Age of Onset   Diabetes Mother    Hypertension Mother    Breast cancer Paternal Aunt 70   Hypertension Father     Exam:  Today's Vitals   10/17/21 1520  BP: 138/84  Weight: 68.9 kg  Height: '5\' 4"'$  (1.626 m)  PainSc: 6   PainLoc: Back   Body mass index is 26.09 kg/m.   General: A&O  ROM of spine: WNL.  Palpation of spine: non TTP.  Strength in the left lower extremity is EHL 5/5, Dorsiflexion 5/5, Plantar flexion 5/5, Hamstring 5/5, Quadricep 5/5, Iliopsoas 5/5. Strength in the right lower extremity is EHL 5/5, Dorsiflexion 5/5, Plantar flexion 5/5, Hamstring 5/5, Quadricep 5/5, Iliopsoas 5/5. Reflexes are 1+ and symmetric at the patella and achilles.   Bilateral lower extremity sensation is intact to light touch.  Clonus is negative.  Toes down-going.  Ambulates with a mildly antalgic gait  MSK: Hypersensitivity to light touch of her left lateral thigh.  Imaging: 10/03/21 lumbar xrays  IMPRESSION: 1. Mild L3 superior endplate compression deformity, unchanged from prior exam. No new fracture. 2. Multilevel degenerative disc disease and facet hypertrophy.     Electronically Signed   By: Keith Rake M.D.   On: 10/05/2021 11:50  10/03/21 thoracic xrays IMPRESSION: 1. T12 compression fracture with slight progressive loss of height centrally since prior exam. Mild bony retropulsion. 2. Stable mild T10 and T11 compression fractures. Stable thoracic kyphosis. 3. Slight levo scoliotic curvature.     Electronically Signed   By: Keith Rake M.D.   On: 10/05/2021 11:48    I have personally reviewed the images and agree with the above interpretation.  Assessment and Plan: Shirley Evans is a pleasant 56 y.o. female with  multiple thoracic and L3 compression fractures that are largely stable on repeat xrays. There is some mild progression of her T12 fracture. I reiterated the importance of compliance with her TLSO brace. We will see her back in 8 weeks with lumbar flex/ex. Should these be stable we will discharge her at that time.  The patient has requested an MRI of her lumbar spine.  She has been told in the past that she needs a lumbar fusion however she is not interested in additional medication management, physical therapy, or surgical intervention however she does want to know if this is the cause of her worsening left leg pain.  I will obtain a lumbar MRI for further evaluation per her request and contact her with the results.  I  also encouraged her to reach out to Dr. Rudene Christians for further evaluation of her hip.   She was encouraged to call the office in the interim with any questions or concerns.    I spent a total of 52 minutes in both face-to-face and non-face-to-face activities for this visit on the date of this encounter including review of imaging, review of records, extensive discussion of differential diagnosis, discussion of treatment options, order placement, and documentation.  Cooper Render PA-C  Neurosurgery

## 2021-10-24 ENCOUNTER — Other Ambulatory Visit: Payer: Self-pay | Admitting: Obstetrics and Gynecology

## 2021-10-24 DIAGNOSIS — Z1231 Encounter for screening mammogram for malignant neoplasm of breast: Secondary | ICD-10-CM

## 2021-11-15 ENCOUNTER — Ambulatory Visit
Admission: RE | Admit: 2021-11-15 | Discharge: 2021-11-15 | Disposition: A | Discharge status: Home / Self Care | Payer: Medicare HMO | Source: Ambulatory Visit | Attending: Obstetrics and Gynecology | Admitting: Obstetrics and Gynecology

## 2021-11-15 DIAGNOSIS — Z1231 Encounter for screening mammogram for malignant neoplasm of breast: Secondary | ICD-10-CM | POA: Diagnosis present

## 2021-11-21 ENCOUNTER — Ambulatory Visit: Admission: RE | Admit: 2021-11-21 | Payer: Medicare HMO | Source: Ambulatory Visit

## 2021-11-29 ENCOUNTER — Other Ambulatory Visit: Payer: Self-pay | Admitting: Pulmonary Disease

## 2021-11-29 DIAGNOSIS — R918 Other nonspecific abnormal finding of lung field: Secondary | ICD-10-CM

## 2021-11-30 ENCOUNTER — Telehealth: Payer: Self-pay

## 2021-11-30 NOTE — Telephone Encounter (Signed)
I will obtain a new authorization.

## 2021-11-30 NOTE — Telephone Encounter (Signed)
-----   Message from Peggyann Shoals sent at 11/30/2021  9:22 AM EDT ----- Regarding: auth for MRI Patient is scheduled for a MRI on 12/02/2021. Pre service center called and the authorization is expired. Can you work on that?

## 2021-12-02 ENCOUNTER — Ambulatory Visit
Admission: RE | Admit: 2021-12-02 | Discharge: 2021-12-02 | Disposition: A | Discharge status: Home / Self Care | Payer: Medicare HMO | Source: Ambulatory Visit | Attending: Neurosurgery | Admitting: Neurosurgery

## 2021-12-02 DIAGNOSIS — S3282XD Multiple fractures of pelvis without disruption of pelvic ring, subsequent encounter for fracture with routine healing: Secondary | ICD-10-CM | POA: Diagnosis present

## 2021-12-02 DIAGNOSIS — M5416 Radiculopathy, lumbar region: Secondary | ICD-10-CM | POA: Insufficient documentation

## 2021-12-05 ENCOUNTER — Other Ambulatory Visit: Payer: Self-pay

## 2021-12-05 DIAGNOSIS — S3282XD Multiple fractures of pelvis without disruption of pelvic ring, subsequent encounter for fracture with routine healing: Secondary | ICD-10-CM

## 2021-12-07 ENCOUNTER — Ambulatory Visit
Admission: RE | Admit: 2021-12-07 | Discharge: 2021-12-07 | Disposition: A | Discharge status: Home / Self Care | Payer: Medicare HMO | Source: Ambulatory Visit | Attending: Pulmonary Disease | Admitting: Pulmonary Disease

## 2021-12-07 ENCOUNTER — Ambulatory Visit
Admission: RE | Admit: 2021-12-07 | Discharge: 2021-12-07 | Disposition: A | Discharge status: Home / Self Care | Payer: Medicare HMO | Source: Ambulatory Visit | Attending: Orthopedic Surgery | Admitting: Orthopedic Surgery

## 2021-12-07 ENCOUNTER — Ambulatory Visit
Admission: RE | Admit: 2021-12-07 | Discharge: 2021-12-07 | Disposition: A | Discharge status: Home / Self Care | Payer: Medicare HMO | Source: Home / Self Care | Attending: Orthopedic Surgery | Admitting: Orthopedic Surgery

## 2021-12-07 DIAGNOSIS — S3282XD Multiple fractures of pelvis without disruption of pelvic ring, subsequent encounter for fracture with routine healing: Secondary | ICD-10-CM | POA: Diagnosis present

## 2021-12-07 DIAGNOSIS — R918 Other nonspecific abnormal finding of lung field: Secondary | ICD-10-CM | POA: Diagnosis present

## 2021-12-12 ENCOUNTER — Other Ambulatory Visit: Payer: Self-pay

## 2021-12-12 ENCOUNTER — Emergency Department: Payer: Medicare HMO

## 2021-12-12 ENCOUNTER — Emergency Department
Admission: EM | Admit: 2021-12-12 | Discharge: 2021-12-12 | Disposition: A | Discharge status: Home / Self Care | Payer: Medicare HMO | Attending: Emergency Medicine | Admitting: Emergency Medicine

## 2021-12-12 ENCOUNTER — Ambulatory Visit: Payer: Medicare HMO | Admitting: Orthopedic Surgery

## 2021-12-12 ENCOUNTER — Encounter: Payer: Self-pay | Admitting: Radiology

## 2021-12-12 DIAGNOSIS — R110 Nausea: Secondary | ICD-10-CM | POA: Diagnosis not present

## 2021-12-12 DIAGNOSIS — R109 Unspecified abdominal pain: Secondary | ICD-10-CM | POA: Insufficient documentation

## 2021-12-12 DIAGNOSIS — R630 Anorexia: Secondary | ICD-10-CM | POA: Insufficient documentation

## 2021-12-12 LAB — CBC
HCT: 41.8 % (ref 36.0–46.0)
Hemoglobin: 14.7 g/dL (ref 12.0–15.0)
MCH: 35.9 pg — ABNORMAL HIGH (ref 26.0–34.0)
MCHC: 35.2 g/dL (ref 30.0–36.0)
MCV: 102 fL — ABNORMAL HIGH (ref 80.0–100.0)
Platelets: 277 10*3/uL (ref 150–400)
RBC: 4.1 MIL/uL (ref 3.87–5.11)
RDW: 12.1 % (ref 11.5–15.5)
WBC: 11.3 10*3/uL — ABNORMAL HIGH (ref 4.0–10.5)
nRBC: 0 % (ref 0.0–0.2)

## 2021-12-12 LAB — BASIC METABOLIC PANEL
Anion gap: 18 — ABNORMAL HIGH (ref 5–15)
BUN: 19 mg/dL (ref 6–20)
CO2: 17 mmol/L — ABNORMAL LOW (ref 22–32)
Calcium: 10.1 mg/dL (ref 8.9–10.3)
Chloride: 100 mmol/L (ref 98–111)
Creatinine, Ser: 0.64 mg/dL (ref 0.44–1.00)
GFR, Estimated: >60 mL/min (ref >60)
Glucose, Bld: 113 mg/dL — ABNORMAL HIGH (ref 70–99)
Potassium: 3.6 mmol/L (ref 3.5–5.1)
Sodium: 135 mmol/L (ref 135–145)

## 2021-12-12 LAB — HEPATIC FUNCTION PANEL
ALT: 176 U/L — ABNORMAL HIGH (ref 0–44)
AST: 357 U/L — ABNORMAL HIGH (ref 15–41)
Albumin: 3.8 g/dL (ref 3.5–5.0)
Alkaline Phosphatase: 134 U/L — ABNORMAL HIGH (ref 38–126)
Bilirubin, Direct: 0.9 mg/dL — ABNORMAL HIGH (ref 0.0–0.2)
Indirect Bilirubin: 1.8 mg/dL — ABNORMAL HIGH (ref 0.3–0.9)
Total Bilirubin: 2.7 mg/dL — ABNORMAL HIGH (ref 0.3–1.2)
Total Protein: 7.8 g/dL (ref 6.5–8.1)

## 2021-12-12 LAB — URINALYSIS, ROUTINE W REFLEX MICROSCOPIC
Glucose, UA: NEGATIVE mg/dL
Hgb urine dipstick: NEGATIVE
Ketones, ur: 20 mg/dL — AB
Nitrite: NEGATIVE
Protein, ur: 30 mg/dL — AB
Specific Gravity, Urine: 1.024 (ref 1.005–1.030)
pH: 5 (ref 5.0–8.0)

## 2021-12-12 LAB — MAGNESIUM: Magnesium: 1.5 mg/dL — ABNORMAL LOW (ref 1.7–2.4)

## 2021-12-12 LAB — LIPASE, BLOOD: Lipase: 63 U/L — ABNORMAL HIGH (ref 11–51)

## 2021-12-12 MED ORDER — ONDANSETRON HCL 4 MG PO TABS: 4.0000 mg | ORAL_TABLET | Freq: Three times a day (TID) | ORAL | 0 refills | Status: DC | PRN

## 2021-12-12 MED ORDER — SODIUM CHLORIDE 0.9 % IV BOLUS
1000.0000 mL | Freq: Once | INTRAVENOUS | Status: AC
  Administered 2021-12-12: 1000 mL via INTRAVENOUS

## 2021-12-12 MED ORDER — IOHEXOL 300 MG/ML  SOLN
100.0000 mL | Freq: Once | INTRAMUSCULAR | Status: AC | PRN
Start: 2021-12-12 — End: 2021-12-12
  Administered 2021-12-12: 100 mL via INTRAVENOUS

## 2021-12-12 MED ORDER — DICYCLOMINE HCL 10 MG PO CAPS: 10.0000 mg | ORAL_CAPSULE | Freq: Three times a day (TID) | ORAL | 0 refills | Status: DC | PRN

## 2021-12-12 MED ORDER — ONDANSETRON HCL 4 MG/2ML IJ SOLN
4.0000 mg | Freq: Once | INTRAMUSCULAR | Status: AC
  Administered 2021-12-12: 4 mg via INTRAVENOUS
  Filled 2021-12-12: qty 2

## 2021-12-12 MED ORDER — MAGNESIUM SULFATE 2 GM/50ML IV SOLN
2.0000 g | Freq: Once | INTRAVENOUS | Status: AC
  Administered 2021-12-12: 2 g via INTRAVENOUS
  Filled 2021-12-12: qty 50

## 2021-12-12 NOTE — ED Notes (Signed)
Dc instructions and scripts reviewed with pt no questions or concerns at this time.  

## 2021-12-12 NOTE — Discharge Instructions (Signed)
Please seek medical attention for any high fevers, chest pain, shortness of breath, change in behavior, persistent vomiting, bloody stool or any other new or concerning symptoms.  

## 2021-12-12 NOTE — ED Notes (Signed)
Pt states she has not eaten in 18 days. Denies pain. States does not want to see or smell food. Pt states she lays in bed 23 hours a day only to get up to use bathroom.  Pt has not been taking bp medication in several weeks. Pt has multiple doctors appointments this week but read on google if she hasn't eaten in 18 days her organs may shut down so she decided to come today. Pt is sitting up watching TV in no apparent distress.

## 2021-12-12 NOTE — ED Notes (Signed)
EDP at bedside to assess.

## 2021-12-12 NOTE — ED Provider Notes (Signed)
[facility] Provider Note    Event Date/Time   First MD Initiated Contact with Patient 12/12/21 1125     (approximate)   History   Failure To Thrive and Nausea   HPI  Shirley Evans is a 56 y.o. female  who presents to the emergency department today because of concern for nausea and decreased oral intake. Patient has dealt with bad episodes of nausea a few times over the past year. Most recently it has been going on for almost three weeks. She states she has not eaten in 18 days. She has been able to keep down some liquids. The patient does not have any nausea medication at home to try. This has been accompanied by abdominal discomfort. She denies any fevers.       Physical Exam   Triage Vital Signs: ED Triage Vitals  Enc Vitals Group     BP 12/12/21 1017 (!) 140/113     Pulse Rate 12/12/21 1017 (!) 106     Resp 12/12/21 1017 20     Temp 12/12/21 1017 97.7 F (36.5 C)     Temp Source 12/12/21 1017 Oral     SpO2 12/12/21 1017 96 %     Weight 12/12/21 1017 150 lb (68 kg)     Height 12/12/21 1017 '5\' 4"'$  (1.626 m)     Head Circumference --      Peak Flow --      Pain Score 12/12/21 1023 8     Pain Loc --      Pain Edu? --      Excl. in [place]? --     Most recent vital signs: Vitals:   12/12/21 1017  BP: (!) 140/113  Pulse: (!) 106  Resp: 20  Temp: 97.7 F (36.5 C)  SpO2: 96%    General: Awake, alert, oriented. CV:  Good peripheral perfusion. Regular rate and rhythm. Resp:  Normal effort. Lungs clear. Abd:  No distention. minimally tender to palpation diffusely   ED Results / Procedures / Treatments   Labs (all labs ordered are listed, but only abnormal results are displayed) Labs Reviewed  CBC - Abnormal; Notable for the following components:      Result Value   WBC 11.3 (*)    MCV 102.0 (*)    MCH 35.9 (*)    All other components within normal limits  BASIC METABOLIC PANEL - Abnormal; Notable for the following components:    CO2 17 (*)    Glucose, Bld 113 (*)    Anion gap 18 (*)    All other components within normal limits  URINALYSIS, ROUTINE W REFLEX MICROSCOPIC - Abnormal; Notable for the following components:   Color, Urine AMBER (*)    APPearance HAZY (*)    Bilirubin Urine SMALL (*)    Ketones, ur 20 (*)    Protein, ur 30 (*)    Leukocytes,Ua MODERATE (*)    Bacteria, UA MANY (*)    Non Squamous Epithelial PRESENT (*)    All other components within normal limits     EKG  None   RADIOLOGY I independently interpreted and visualized the [place] RUQ. My interpretation: gallstones Radiology interpretation:  IMPRESSION:  Cholelithiasis without cholecystitis.  Hepatic steatosis.    I independently interpreted and visualized the CT abd/pel. My interpretation: no free air Radiology interpretation:  IMPRESSION:  1. Slightly increased vertebral body height loss at T10, now  approximately 30%, previously 20% on 08/24/2021. Unchanged  appearance of  previously noted T11 and T12 compression deformities.  2. No additional acute process in the abdomen or pelvis.     PROCEDURES:  Critical Care performed: No  Procedures   MEDICATIONS ORDERED IN ED: Medications - No data to display   IMPRESSION / MDM / [place] / ED COURSE  I reviewed the triage vital signs and the nursing notes.                              Differential diagnosis includes, but is not limited to, gallbladder disease, gastroenteritis, pancreatitis.  Patient's presentation is most consistent with acute presentation with potential threat to life or bodily function.  Patient presented to the emergency department today because of concerns for decreased appetite over the past almost 3 weeks.  Patient's blood work without concerning electrolyte abnormality.  No concerning leukocytosis.  Did obtain imaging of the patient's abdomen given elevated LFTs.  This did not shows findings concerning for cholecystitis or ductal  dilatation.  At this time I do think elevation of LFTs could be secondary to viral illness or poor p.o. intake.  Patient did feel better after fluids and medication here.  We will plan on discharging with prescription for further medication.  Patient has follow-up appointment already scheduled with GI.   FINAL CLINICAL IMPRESSION(S) / ED DIAGNOSES   Final diagnoses:  Decreased appetite     Note:  This document was prepared using Dragon voice recognition software and may include unintentional dictation errors.    Nance Pear, MD 12/12/21 [phone]

## 2021-12-12 NOTE — ED Triage Notes (Signed)
Pt here with FTT and nausea. Pt states she has not eaten in 18 days but occasionally takes an Ensure shake. Pt also has chronic back pain. Pt denies N/V/D.

## 2021-12-13 ENCOUNTER — Other Ambulatory Visit: Payer: Self-pay

## 2021-12-13 LAB — URINE CULTURE: Culture: NO GROWTH

## 2021-12-13 NOTE — Progress Notes (Signed)
error 

## 2021-12-13 NOTE — Progress Notes (Addendum)
Follow-up note: Referring Physician:  Loleta Dicker, [place],  [place] [address]  Primary Physician:  Romualdo Bolk, FNP  Chief Complaint:  f/u for multiple thoracic compression fractures and L3 compression fracture  History of Present Illness: Shirley Evans is a 56 y.o. female who presents for multiple thoracic compression fractures and L3 compression fracture DOI 08/27/21.   Xrays at her last visit showed some progression of T12 fracture at last visit. She had not been wearing brace and was reminded to do this. MRI lumbar spine also ordered at last visit.   She is here for follow up to review lumbar MRI and repeat thoracic/lumbar xrays.   She has been sick for last 2-3 weeks and was dry heaving. She has severe pain in her mid back. This is worse that it has been. No radiation of pain to arms or around chest. No weakness in arms/legs.   She has chronic LBP x years. Some pain in left lateral thigh, but she also had recent left hips surgery. She has numbness in her toes. She feels like her LBP is at her baseline.    Review of Systems:  A 10 point review of systems is negative,  and the pertinent positives and negatives detailed in the HPI.  Past Medical History: Past Medical History:  Diagnosis Date   Cancer ([facility])    cervical   COPD (chronic obstructive pulmonary disease) (HCC)    GERD (gastroesophageal reflux disease)    History of Helicobacter pylori infection    Hypertension    Menopausal state    Osteopenia    Postmenopausal atrophic vaginitis     Past Surgical History: Past Surgical History:  Procedure Laterality Date   BREAST BIOPSY Bilateral    neg   TOTAL HIP ARTHROPLASTY Left 12/22/2020   Procedure: TOTAL HIP ARTHROPLASTY ANTERIOR APPROACH;  Surgeon: Hessie Knows, MD;  Location: [facility] ORS;  Service: Orthopedics;  Laterality: Left;    Allergies: Allergies as of 12/14/2021 - Review Complete 12/14/2021  Allergen Reaction  Noted   Demerol [meperidine] Nausea Only and Nausea And Vomiting 08/23/2014   Codeine Nausea Only 08/23/2014    Medications: Outpatient Encounter Medications as of 12/14/2021  Medication Sig   buPROPion (WELLBUTRIN) 75 MG tablet Take 75 mg by mouth 3 (three) times daily.   oxyCODONE-acetaminophen (PERCOCET) 5-325 MG tablet Take 1 tablet by mouth every 8 (eight) hours as needed for up to 7 days for severe pain.   rosuvastatin (CRESTOR) 40 MG tablet Take 40 mg by mouth daily.   albuterol (PROVENTIL) (2.5 MG/3ML) 0.083% nebulizer solution Inhale into the lungs.   albuterol (VENTOLIN HFA) 108 (90 Base) MCG/ACT inhaler Inhale into the lungs.   cholecalciferol (VITAMIN D3) 25 MCG (1000 UNIT) tablet Take 1,000 Units by mouth daily.   cyclobenzaprine (FLEXERIL) 10 MG tablet Take 10 mg by mouth daily.   dicyclomine (BENTYL) 10 MG capsule Take 1 capsule (10 mg total) by mouth 3 (three) times daily as needed (abd pain).   lidocaine (LIDODERM) 5 % Place onto the skin.   lisinopril-hydrochlorothiazide (ZESTORETIC) 20-12.5 MG tablet Take 1 tablet by mouth daily.   loperamide (IMODIUM) 2 MG capsule Take 1 capsule (2 mg total) by mouth as needed for diarrhea or loose stools.   magnesium gluconate (MAGONATE) 500 MG tablet Take 1 tablet (500 mg total) by mouth 2 (two) times daily.   omeprazole (PRILOSEC) 20 MG capsule Take 1 capsule (20 mg total) by mouth 2 (two)  times daily before a meal.   sertraline (ZOLOFT) 100 MG tablet Take 100 mg by mouth daily.   valACYclovir (VALTREX) 1000 MG tablet Take 1,000 mg by mouth daily.   [DISCONTINUED] acetaminophen (TYLENOL) 500 MG tablet Take 1 tablet (500 mg total) by mouth every 4 (four) hours as needed for mild pain, fever or headache.   [DISCONTINUED] carvedilol (COREG) 3.125 MG tablet Take 1 tablet (3.125 mg total) by mouth 2 (two) times daily with a meal. (Patient not taking: Reported on 12/14/2021)   [DISCONTINUED] cloNIDine (CATAPRES) 0.1 MG tablet Take 1 tablet  (0.1 mg total) by mouth 2 (two) times daily. (Patient not taking: Reported on 12/14/2021)   [DISCONTINUED] gabapentin (NEURONTIN) 300 MG capsule Take 1 capsule by mouth in the morning and at bedtime. (Patient not taking: Reported on 12/14/2021)   [DISCONTINUED] lisinopril (ZESTRIL) 40 MG tablet Take 1 tablet (40 mg total) by mouth daily. (Patient not taking: Reported on 12/12/2021)   [DISCONTINUED] ondansetron (ZOFRAN) 4 MG tablet Take 1 tablet (4 mg total) by mouth every 8 (eight) hours as needed. (Patient not taking: Reported on 12/14/2021)   [DISCONTINUED] oxyCODONE (OXY IR/ROXICODONE) 5 MG immediate release tablet Take 1-2 tablets (5-10 mg total) by mouth every 4 (four) hours as needed for severe pain. (Patient not taking: Reported on 12/12/2021)   No facility-administered encounter medications on file as of 12/14/2021.    Social History: Social History   Tobacco Use   Smoking status: Former    Packs/day: 1.00    Years: 30.00    Total pack years: 30.00    Types: Cigarettes   Smokeless tobacco: Never  Vaping Use   Vaping Use: Never used  Substance Use Topics   Alcohol use: Yes    Alcohol/week: 0.0 standard drinks of alcohol   Drug use: Yes    Types: Marijuana    Comment: occasion recreational use    Family Medical History: Family History  Problem Relation Age of Onset   Diabetes Mother    Hypertension Mother    Breast cancer Paternal Aunt 40   Hypertension Father     Exam:  Today's Vitals   12/14/21 1327  Weight: 143 lb 6.4 oz (65 kg)  Height: '5\' 4"'$  (1.626 m)  PainSc: 10-Worst pain ever  PainLoc: Back    Body mass index is 24.61 kg/m.   General: A&O, laying on exam table in pain.   ROM of spine limited due to pain.   Palpation of spine: tender at TL junction.   Strength in the left lower extremity is EHL 5/5, Dorsiflexion 5/5, Plantar flexion 5/5, Hamstring 5/5, Quadricep 5/5, Iliopsoas 5/5. Strength in the right lower extremity is EHL 5/5, Dorsiflexion 5/5,  Plantar flexion 5/5, Hamstring 5/5, Quadricep 5/5, Iliopsoas 5/5.   Reflexes are 1+ and symmetric at the patella and achilles.     Bilateral lower extremity sensation is intact to light touch but decreased in her toes. Clonus is negative. Ambulates with a mildly antalgic gait  Imaging: Lumbar xrays 12/07/21:  FINDINGS: Frontal, lateral neutral, lateral flexion, lateral extension views of the lumbar spine are obtained. There are 5 non-rib-bearing lumbar type vertebral bodies in stable anatomic alignment. No instability during flexion or extension.   Chronic T12 wedge compression deformity with greater than 75% loss of height. Stable invagination of the superior endplate of the L3 vertebral body. No acute bony abnormality.   Stable lower lumbar spondylosis and facet hypertrophy greatest at the L5-S1 level. Diffuse aortic atherosclerosis.   IMPRESSION: 1.  Chronic T12 compression fracture. 2. Stable invagination superior endplate L3 vertebral body. 3. No acute fracture. 4. Stable lower lumbar spondylosis and facet hypertrophy greatest at the lumbosacral junction. 5. No instability with flexion or extension.     Electronically Signed   By: Randa Ngo M.D.   On: 12/08/2021 22:45  Lumbar MRI 9/0/23:  FINDINGS: Segmentation: Conventional anatomy assumed, with the last open disc space designated L5-S1.Concordant with previous imaging.   Alignment:  Physiologic.   Vertebrae: New compression deformities were noted at T10, T11, T12 and L3 on the abdominal CT performed 08/25/2021. The mild superior endplate compression fracture at L3 is unchanged from that time, without osseous retropulsion or significant residual marrow edema. The T12 fracture has progressed with more than 75% loss of vertebral body height and 6 mm of osseous retropulsion. There is marrow edema throughout the T12 vertebral body. A mild superior endplate compression fracture at T11 appears unchanged, with mild  residual marrow edema. T10 not imaged. No new fractures are identified. There are chronic endplate degenerative changes at L5-S1. No evidence of pars defect or suspicious marrow lesion.   Conus medullaris: Extends to the L1 level and appears normal.   Paraspinal and other soft tissues: No significant paraspinal or epidural fluid collections or inflammatory changes are identified. Diffuse prominence of the epidural fat, especially at L4-5 and L5-S1.   Disc levels:   T11-12. Mild disc bulging. As above, osseous retropulsion at the T12 fracture with mild mass effect on the distal thoracic cord. No cord compression or significant foraminal narrowing.   T12-L1: Normal interspace.   L1-2: Chronic loss of disc height with mild disc bulging, facet and ligamentous hypertrophy. No spinal stenosis or nerve root encroachment.   L2-3: Disc height and hydration are maintained. Mild bilateral facet hypertrophy. No significant spinal stenosis or nerve root encroachment.   L3-4: Disc height and hydration are maintained. Mild disc bulging eccentric to the right and mild facet and ligamentous hypertrophy. No significant spinal stenosis or nerve root encroachment.   L4-5: Chronic degenerative disc disease with progressive annular disc bulging and endplate osteophyte formation compared with previous MRI. Mild facet and ligamentous hypertrophy. Resulting mild spinal stenosis with mild lateral recess narrowing bilaterally. The foramina appear sufficiently patent.   L5-S1: Chronic degenerative disc disease with disc bulging and endplate osteophytes, progressive from remote MRI. Mild facet and ligamentous hypertrophy. Prominent epidural fat contributes to mild spinal stenosis. Mild osseous foraminal narrowing bilaterally due to osteophytes.   IMPRESSION: 1. Progressive loss of vertebral body height and progressive osseous retropulsion at the T12 fracture compared with CT of 08/25/2021. This  fracture does not appear significantly changed from the more recent radiographs of 10/03/2021, but there is persistent associated bone marrow edema. 2. The mild fractures involving the superior endplate of E52 and L3 appear unchanged. No new fractures. 3. Superimposed spondylosis, increased from remote MRI, but similar to more recent CTs. 4. Mild multifactorial spinal stenosis at L4-5 due to disc bulging, facet and ligamentous hypertrophy and prominent epidural fat. 5. Mild multifactorial spinal stenosis with osseous foraminal narrowing bilaterally at L5-S1.     Electronically Signed   By: Richardean Sale M.D.   On: 12/04/2021 11:43    I have personally reviewed the images and agree with the above interpretation.  She has increased kyphosis above compression fracture at 12. MRI lumbar spine shows persistent bone marrow edema at T12.   Imaging reviewed with Dr. Izora Ribas.   Assessment and Plan: Ms. Bittinger is a  pleasant 56 y.o. female with multiple thoracic and L3 compression fractures DOI 08/26/21.   She has been sick for last 2-3 weeks and was dry heaving. She has severe pain in her mid back that is worse than it has been. No radiation of pain to arms or around chest. No weakness in arms/legs.   She has chronic LBP x years that she feels is at her baseline.   She has increased thoracic kyphosis above T12 fracture along with bone marrow edema at T12 on lumbar MRI. Also with DDD L4-S1 with mild stenosis.   Treatment options reviewed with Dr. Izora Ribas. Following plan made with patient and her mom:   - Full length scoliosis xrays to be done at [facility].  - New prescription for percocet to take for severe pain. Reviewed dosing and side effects. PMP reviewed and is appropriate.  - Will review above imaging with Dr. Izora Ribas and call her with further plan. She may be a surgical candidate for fracture at T12. Not a candidate for kyphoplasty due to retropulsion.  - Hold on any further  treatment for lumbar spine.   I spent a total of 30 minutes in both face-to-face and non-face-to-face activities for this visit on the date of this encounter including review of imaging, review of records, extensive discussion of differential diagnosis, discussion of treatment options, order placement, and documentation.  ADDENDUM 12/21/21:  FINDINGS: No significant scoliosis.   Normal cervical lordosis. C6-T1 is obscured by overlying soft tissue and osseous structures. Mild intervertebral disc space narrowing and endplate remodeling at Q2-2 and to a lesser extent C4-5 in keeping with changes of mild degenerative disc disease. Cervical spinal canal is widely patent. Prevertebral soft tissues are not thickened.   Extension weighted thoracolumbar kyphosis secondary to remote appearing superior endplate fractures of W97 and T11 with approximately 10-20% loss of height. Anterior wedge compression deformity of T12 with approximately 50% loss of height. There isl minimal retropulsion of the posterosuperior aspect of the T12 vertebral body by approximately 3-4 mm. No listhesis.   5 mm retrolisthesis L1 upon L2, likely degenerative in nature. Otherwise normal lumbar lordosis. No acute fracture of the lumbar spine. Vertebral body height is preserved. Mild intervertebral disc space narrowing and endplate remodeling of L8-X2 and L4-S1 is present in keeping with changes of mild degenerative disc disease.   Aortoiliac atherosclerotic calcification noted. 5 mm calculus overlies the expected lower pole of the left kidney. Left total hip arthroplasty has been performed.   IMPRESSION: 1. Remote appearing superior endplate fractures of J19 and T11 with 10-20% loss of height, unchanged 2. Remote anterior wedge compression deformity of T12 with approximately 50% loss of height, unchanged. Stable 3-4 mm retropulsion of the posterosuperior aspect of T12. No listhesis. 3. 5 mm retrolisthesis L1 upon L2,  likely degenerative in nature.     Electronically Signed   By: Fidela Salisbury M.D.   On: 12/17/2021 00:00  I have personally reviewed the images and agree with the above interpretation.  Reviewed above images with Dr. Izora Ribas. He wants to see her back to discuss further treatment options. She would need DEXA bone density test prior to any surgery.   Geronimo Boot PA-C Neurosurgery

## 2021-12-14 ENCOUNTER — Ambulatory Visit
Admission: RE | Admit: 2021-12-14 | Discharge: 2021-12-14 | Disposition: A | Discharge status: Home / Self Care | Payer: Medicare HMO | Source: Ambulatory Visit | Attending: Orthopedic Surgery | Admitting: Orthopedic Surgery

## 2021-12-14 ENCOUNTER — Encounter: Payer: Self-pay | Admitting: Orthopedic Surgery

## 2021-12-14 ENCOUNTER — Ambulatory Visit (INDEPENDENT_AMBULATORY_CARE_PROVIDER_SITE_OTHER): Payer: Medicare HMO | Admitting: Orthopedic Surgery

## 2021-12-14 VITALS — Ht 64.0 in | Wt 143.4 lb

## 2021-12-14 DIAGNOSIS — S22080S Wedge compression fracture of T11-T12 vertebra, sequela: Secondary | ICD-10-CM | POA: Insufficient documentation

## 2021-12-14 DIAGNOSIS — M40204 Unspecified kyphosis, thoracic region: Secondary | ICD-10-CM

## 2021-12-14 DIAGNOSIS — S32030D Wedge compression fracture of third lumbar vertebra, subsequent encounter for fracture with routine healing: Secondary | ICD-10-CM

## 2021-12-14 DIAGNOSIS — S22080D Wedge compression fracture of T11-T12 vertebra, subsequent encounter for fracture with routine healing: Secondary | ICD-10-CM

## 2021-12-14 DIAGNOSIS — M5136 Other intervertebral disc degeneration, lumbar region: Secondary | ICD-10-CM | POA: Diagnosis not present

## 2021-12-14 MED ORDER — OXYCODONE-ACETAMINOPHEN 5-325 MG PO TABS: 1.0000 | ORAL_TABLET | Freq: Three times a day (TID) | ORAL | 0 refills | Status: DC | PRN

## 2021-12-21 ENCOUNTER — Telehealth: Payer: Self-pay | Admitting: Orthopedic Surgery

## 2021-12-21 DIAGNOSIS — S22080S Wedge compression fracture of T11-T12 vertebra, sequela: Secondary | ICD-10-CM

## 2021-12-21 DIAGNOSIS — M40204 Unspecified kyphosis, thoracic region: Secondary | ICD-10-CM

## 2021-12-21 MED ORDER — OXYCODONE-ACETAMINOPHEN 5-325 MG PO TABS: 1.0000 | ORAL_TABLET | Freq: Three times a day (TID) | ORAL | 0 refills | Status: DC | PRN

## 2021-12-21 NOTE — Telephone Encounter (Signed)
-----   Message from Peggyann Shoals sent at 12/21/2021  1:22 PM EDT ----- She confirmed appt for 01/02/2022, can you send in a refill of the oxy with tylenol to [facility]. She takes them as needed. ----- Message ----- From: Geronimo Boot, PA-C Sent: 12/21/2021  12:59 PM EDT To: Peggyann Shoals  Please call and schedule patient to see Izora Ribas at his next available. He will review her imaging at that visit.   Thanks!

## 2021-12-21 NOTE — Telephone Encounter (Signed)
Patient has been notified of refill.

## 2021-12-21 NOTE — Telephone Encounter (Signed)
Refill of percocet sent to pharmacy. You can let her know.   PMP reviewed and is appropriate.

## 2021-12-27 ENCOUNTER — Telehealth: Payer: Self-pay

## 2021-12-27 NOTE — Telephone Encounter (Signed)
-----   Message from Peggyann Shoals sent at 12/27/2021  9:52 AM EDT ----- Regarding: xray results Contact: [phone] Patient seen 12/14/2021 and was sent for additional xrays. Can you call her with the results or should she be seen in the office? She is still having a lot of pain.

## 2021-12-27 NOTE — Telephone Encounter (Signed)
She has f/u with Dr. Izora Ribas on 01/02/22 to review xray results and discuss further options.

## 2021-12-29 NOTE — Telephone Encounter (Signed)
Can you please call the patient and let her know that stacy wants her to follow up with Dr. Izora Ribas about different treatment options and they will discuss the xrays at that appt on 01/02/22

## 2021-12-29 NOTE — Telephone Encounter (Signed)
She confirmed.

## 2022-01-01 NOTE — Progress Notes (Deleted)
Referring Physician:  Geronimo Boot, PA-C [address] [place],   [address]  Primary Physician:  Romualdo Bolk, FNP  History of Present Illness: 01/01/2022 [name] is here today with a chief complaint of *** chronic low back pain that doesn't radiate into the legs  Duration: *** Location: *** Quality: ***severe, sharp, stabbing Severity: *** 10/10 Precipitating: aggravated by ***walking, standing, bending, lifting Modifying factors: made better by ***nothing Weakness: none Timing: ***constant Bowel/Bladder Dysfunction: none  Conservative measures:  Physical therapy: *** has not participated in? Multimodal medical therapy including regular antiinflammatories: *** oxycodone, flexeril, lidocaine patch, tylenol, gabapentin Injections: *** has not had any epidural steroid injections  Past Surgery: ***denies  Kathy L Turko has ***no symptoms of cervical myelopathy.  The symptoms are causing a significant impact on the patient's life.   Review of Systems:  A 10 point review of systems is negative, except for the pertinent positives and negatives detailed in the HPI.  Past Medical History: Past Medical History:  Diagnosis Date   Cancer ([place])    cervical   COPD (chronic obstructive pulmonary disease) (HCC)    GERD (gastroesophageal reflux disease)    History of Helicobacter pylori infection    Hypertension    Menopausal state    Osteopenia    Postmenopausal atrophic vaginitis     Past Surgical History: Past Surgical History:  Procedure Laterality Date   BREAST BIOPSY Bilateral    neg   TOTAL HIP ARTHROPLASTY Left 12/22/2020   Procedure: TOTAL HIP ARTHROPLASTY ANTERIOR APPROACH;  Surgeon: Hessie Knows, MD;  Location: [facility] ORS;  Service: Orthopedics;  Laterality: Left;    Allergies: Allergies as of 01/02/2022 - Review Complete 12/14/2021  Allergen Reaction Noted   Demerol [meperidine] Nausea Only and Nausea And Vomiting  08/23/2014   Codeine Nausea Only 08/23/2014    Medications: No outpatient medications have been marked as taking for the 01/02/22 encounter (Appointment) with Meade Maw, MD.    Social History: Social History   Tobacco Use   Smoking status: Former    Packs/day: 1.00    Years: 30.00    Total pack years: 30.00    Types: Cigarettes   Smokeless tobacco: Never  Vaping Use   Vaping Use: Never used  Substance Use Topics   Alcohol use: Yes    Alcohol/week: 0.0 standard drinks of alcohol   Drug use: Yes    Types: Marijuana    Comment: occasion recreational use    Family Medical History: Family History  Problem Relation Age of Onset   Diabetes Mother    Hypertension Mother    Breast cancer Paternal Aunt 41   Hypertension Father     Physical Examination: There were no vitals filed for this visit.  General: Patient is well developed, well nourished, calm, collected, and in no apparent distress. Attention to examination is appropriate.  Neck:   Supple.  Full range of motion.  Respiratory: Patient is breathing without any difficulty.   NEUROLOGICAL:     Awake, alert, oriented to person, place, and time.  Speech is clear and fluent. Fund of knowledge is appropriate.   Cranial Nerves: Pupils equal round and reactive to light.  Facial tone is symmetric.  Facial sensation is symmetric. Shoulder shrug is symmetric. Tongue protrusion is midline.  There is no pronator drift.  ROM of spine: full.    Strength: Side Biceps Triceps Deltoid Interossei Grip Wrist Ext. Wrist Flex.  R '5 5 5 5 5 5 '$ 5  L '5 5 5 5 5 5 5   '$ Side Iliopsoas Quads Hamstring PF DF EHL  R '5 5 5 5 5 5  '$ L '5 5 5 5 5 5   '$ Reflexes are ***2+ and symmetric at the biceps, triceps, brachioradialis, patella and achilles.   Hoffman's is absent.   Bilateral upper and lower extremity sensation is intact to light touch.    No evidence of dysmetria noted.  Gait is normal.     Medical Decision  Making  Imaging: ***  I have personally reviewed the images and agree with the above interpretation.  Assessment and Plan: Ms. Dowding is a pleasant 56 y.o. female with ***   I spent a total of *** minutes in face-to-face and non-face-to-face activities related to this patient's care today.  Thank you for involving me in the care of this patient.      Chester K. Izora Ribas MD, [facility] Neurosurgery

## 2022-01-02 ENCOUNTER — Ambulatory Visit: Payer: Medicare HMO | Admitting: Neurosurgery

## 2022-01-03 ENCOUNTER — Emergency Department
Admission: EM | Admit: 2022-01-03 | Discharge: 2022-01-03 | Disposition: A | Discharge status: Home / Self Care | Payer: Medicare HMO | Attending: Emergency Medicine | Admitting: Emergency Medicine

## 2022-01-03 ENCOUNTER — Emergency Department: Payer: Medicare HMO

## 2022-01-03 ENCOUNTER — Other Ambulatory Visit: Payer: Self-pay

## 2022-01-03 ENCOUNTER — Encounter: Payer: Self-pay | Admitting: Intensive Care

## 2022-01-03 DIAGNOSIS — E860 Dehydration: Secondary | ICD-10-CM | POA: Insufficient documentation

## 2022-01-03 DIAGNOSIS — R1110 Vomiting, unspecified: Secondary | ICD-10-CM | POA: Diagnosis present

## 2022-01-03 DIAGNOSIS — N200 Calculus of kidney: Secondary | ICD-10-CM | POA: Insufficient documentation

## 2022-01-03 DIAGNOSIS — K8020 Calculus of gallbladder without cholecystitis without obstruction: Secondary | ICD-10-CM | POA: Insufficient documentation

## 2022-01-03 DIAGNOSIS — I10 Essential (primary) hypertension: Secondary | ICD-10-CM | POA: Insufficient documentation

## 2022-01-03 DIAGNOSIS — K760 Fatty (change of) liver, not elsewhere classified: Secondary | ICD-10-CM | POA: Insufficient documentation

## 2022-01-03 DIAGNOSIS — R7989 Other specified abnormal findings of blood chemistry: Secondary | ICD-10-CM | POA: Diagnosis not present

## 2022-01-03 DIAGNOSIS — J449 Chronic obstructive pulmonary disease, unspecified: Secondary | ICD-10-CM | POA: Diagnosis not present

## 2022-01-03 DIAGNOSIS — R000 Tachycardia, unspecified: Secondary | ICD-10-CM | POA: Insufficient documentation

## 2022-01-03 DIAGNOSIS — I700 Atherosclerosis of aorta: Secondary | ICD-10-CM | POA: Insufficient documentation

## 2022-01-03 DIAGNOSIS — Z1152 Encounter for screening for COVID-19: Secondary | ICD-10-CM | POA: Diagnosis not present

## 2022-01-03 LAB — URINALYSIS, COMPLETE (UACMP) WITH MICROSCOPIC
Bilirubin Urine: NEGATIVE
Glucose, UA: NEGATIVE mg/dL
Hgb urine dipstick: NEGATIVE
Ketones, ur: NEGATIVE mg/dL
Nitrite: NEGATIVE
Protein, ur: 30 mg/dL — AB
Specific Gravity, Urine: >1.046 — ABNORMAL HIGH (ref 1.005–1.030)
pH: 7 (ref 5.0–8.0)

## 2022-01-03 LAB — CBC WITH DIFFERENTIAL/PLATELET
Abs Immature Granulocytes: 0.04 10*3/uL (ref 0.00–0.07)
Basophils Absolute: 0.1 10*3/uL (ref 0.0–0.1)
Basophils Relative: 0 %
Eosinophils Absolute: 0 10*3/uL (ref 0.0–0.5)
Eosinophils Relative: 0 %
HCT: 47.6 % — ABNORMAL HIGH (ref 36.0–46.0)
Hemoglobin: 16.8 g/dL — ABNORMAL HIGH (ref 12.0–15.0)
Immature Granulocytes: 0 %
Lymphocytes Relative: 21 %
Lymphs Abs: 2.8 10*3/uL (ref 0.7–4.0)
MCH: 35.1 pg — ABNORMAL HIGH (ref 26.0–34.0)
MCHC: 35.3 g/dL (ref 30.0–36.0)
MCV: 99.4 fL (ref 80.0–100.0)
Monocytes Absolute: 1.2 10*3/uL — ABNORMAL HIGH (ref 0.1–1.0)
Monocytes Relative: 9 %
Neutro Abs: 9.3 10*3/uL — ABNORMAL HIGH (ref 1.7–7.7)
Neutrophils Relative %: 70 %
Platelets: 343 10*3/uL (ref 150–400)
RBC: 4.79 MIL/uL (ref 3.87–5.11)
RDW: 12.7 % (ref 11.5–15.5)
WBC: 13.5 10*3/uL — ABNORMAL HIGH (ref 4.0–10.5)
nRBC: 0 % (ref 0.0–0.2)

## 2022-01-03 LAB — COMPREHENSIVE METABOLIC PANEL
ALT: 46 U/L — ABNORMAL HIGH (ref 0–44)
AST: 118 U/L — ABNORMAL HIGH (ref 15–41)
Albumin: 4.5 g/dL (ref 3.5–5.0)
Alkaline Phosphatase: 130 U/L — ABNORMAL HIGH (ref 38–126)
Anion gap: 20 — ABNORMAL HIGH (ref 5–15)
BUN: 19 mg/dL (ref 6–20)
CO2: 21 mmol/L — ABNORMAL LOW (ref 22–32)
Calcium: 10.6 mg/dL — ABNORMAL HIGH (ref 8.9–10.3)
Chloride: 94 mmol/L — ABNORMAL LOW (ref 98–111)
Creatinine, Ser: 1.72 mg/dL — ABNORMAL HIGH (ref 0.44–1.00)
GFR, Estimated: 34 mL/min — ABNORMAL LOW (ref >60)
Glucose, Bld: 236 mg/dL — ABNORMAL HIGH (ref 70–99)
Potassium: 3.9 mmol/L (ref 3.5–5.1)
Sodium: 135 mmol/L (ref 135–145)
Total Bilirubin: 3.5 mg/dL — ABNORMAL HIGH (ref 0.3–1.2)
Total Protein: 8.6 g/dL — ABNORMAL HIGH (ref 6.5–8.1)

## 2022-01-03 LAB — LIPASE, BLOOD: Lipase: 39 U/L (ref 11–51)

## 2022-01-03 LAB — LACTIC ACID, PLASMA
Lactic Acid, Venous: 3.1 mmol/L (ref 0.5–1.9)
Lactic Acid, Venous: 2.9 mmol/L (ref 0.5–1.9)

## 2022-01-03 LAB — RESP PANEL BY RT-PCR (FLU A&B, COVID) ARPGX2
Influenza A by PCR: NEGATIVE
Influenza B by PCR: NEGATIVE
SARS Coronavirus 2 by RT PCR: NEGATIVE

## 2022-01-03 LAB — TSH: TSH: 2.803 u[IU]/mL (ref 0.350–4.500)

## 2022-01-03 LAB — MAGNESIUM: Magnesium: 1 mg/dL — ABNORMAL LOW (ref 1.7–2.4)

## 2022-01-03 LAB — T4, FREE: Free T4: 1 ng/dL (ref 0.61–1.12)

## 2022-01-03 MED ORDER — ONDANSETRON HCL 4 MG/2ML IJ SOLN
4.0000 mg | Freq: Once | INTRAMUSCULAR | Status: AC
  Administered 2022-01-03: 4 mg via INTRAVENOUS
  Filled 2022-01-03: qty 2

## 2022-01-03 MED ORDER — FAMOTIDINE 20 MG PO TABS: 20.0000 mg | ORAL_TABLET | Freq: Two times a day (BID) | ORAL | 0 refills | Status: DC

## 2022-01-03 MED ORDER — DEXTROSE 5 % IN LACTATED RINGERS IV BOLUS
1000.0000 mL | Freq: Once | INTRAVENOUS | Status: DC
  Filled 2022-01-03: qty 1000

## 2022-01-03 MED ORDER — LACTATED RINGERS IV BOLUS
1000.0000 mL | Freq: Once | INTRAVENOUS | Status: AC
  Administered 2022-01-03: 1000 mL via INTRAVENOUS

## 2022-01-03 MED ORDER — GADOBUTROL 1 MMOL/ML IV SOLN
7.0000 mL | Freq: Once | INTRAVENOUS | Status: AC | PRN
  Administered 2022-01-03: 7 mL via INTRAVENOUS

## 2022-01-03 MED ORDER — SODIUM CHLORIDE 0.9 % IV BOLUS
1000.0000 mL | Freq: Once | INTRAVENOUS | Status: AC
  Administered 2022-01-03: 1000 mL via INTRAVENOUS

## 2022-01-03 MED ORDER — IOHEXOL 300 MG/ML  SOLN
75.0000 mL | Freq: Once | INTRAMUSCULAR | Status: AC | PRN
  Administered 2022-01-03: 60 mL via INTRAVENOUS

## 2022-01-03 MED ORDER — PANTOPRAZOLE SODIUM 40 MG IV SOLR
40.0000 mg | Freq: Once | INTRAVENOUS | Status: AC
  Administered 2022-01-03: 40 mg via INTRAVENOUS
  Filled 2022-01-03: qty 10

## 2022-01-03 MED ORDER — ONDANSETRON 4 MG PO TBDP: 4.0000 mg | ORAL_TABLET | Freq: Three times a day (TID) | ORAL | 0 refills | Status: DC | PRN

## 2022-01-03 MED ORDER — MAGNESIUM SULFATE 2 GM/50ML IV SOLN
2.0000 g | INTRAVENOUS | Status: AC
  Administered 2022-01-03: 2 g via INTRAVENOUS
  Filled 2022-01-03: qty 50

## 2022-01-03 NOTE — ED Provider Notes (Signed)
[facility] Provider Note    Event Date/Time   First MD Initiated Contact with Patient 01/03/22 1014     (approximate)   History   Chief Complaint: Abnormal Lab   HPI  Shirley Evans is a 56 y.o. female with a history of hypertension, GERD, COPD, polysubstance abuse of alcohol and cocaine who comes to the ED due to abnormal labs.  Patient is a very poor historian and reports that she has had vomiting and diarrhea for the past year.  She is unable to specify what acute changes she is experienced that caused her to seek medical care at her doctor's office.  From her perspective she was having routine follow-up labs, but she does endorse having persistent vomiting and diarrhea.  Last emesis was this morning at 8:00.  Last bowel movement was yesterday.  Reports poor oral intake.  Denies any pain or fever.     Physical Exam   Triage Vital Signs: ED Triage Vitals  Enc Vitals Group     BP 01/03/22 0919 (!) 121/96     Pulse Rate 01/03/22 0919 (!) 146     Resp 01/03/22 0919 (!) 22     Temp 01/03/22 0926 97.8 F (36.6 C)     Temp Source 01/03/22 0926 Oral     SpO2 01/03/22 0919 94 %     Weight 01/03/22 0919 150 lb (68 kg)     Height 01/03/22 0919 '5\' 4"'$  (1.626 m)     Head Circumference --      Peak Flow --      Pain Score 01/03/22 0919 7     Pain Loc --      Pain Edu? --      Excl. in [place]? --     Most recent vital signs: Vitals:   01/03/22 1100 01/03/22 1130  BP: (!) 167/119 (!) 158/119  Pulse: (!) 115 94  Resp: 20 20  Temp:  98.3 F (36.8 C)  SpO2: 97% 91%    General: Awake, no distress.  CV:  Good peripheral perfusion.  Tachycardia heart rate 130.  Symmetric peripheral pulses Resp:  Normal effort.  Clear to auscultation bilaterally Abd:  No distention.  Soft nontender Other:  Dry mucous membranes.  No lower extremity edema or calf tenderness   ED Results / Procedures / Treatments   Labs (all labs ordered are listed, but only abnormal  results are displayed) Labs Reviewed  CBC WITH DIFFERENTIAL/PLATELET - Abnormal; Notable for the following components:      Result Value   WBC 13.5 (*)    Hemoglobin 16.8 (*)    HCT 47.6 (*)    MCH 35.1 (*)    Neutro Abs 9.3 (*)    Monocytes Absolute 1.2 (*)    All other components within normal limits  COMPREHENSIVE METABOLIC PANEL - Abnormal; Notable for the following components:   Chloride 94 (*)    CO2 21 (*)    Glucose, Bld 236 (*)    Creatinine, Ser 1.72 (*)    Calcium 10.6 (*)    Total Protein 8.6 (*)    AST 118 (*)    ALT 46 (*)    Alkaline Phosphatase 130 (*)    Total Bilirubin 3.5 (*)    GFR, Estimated 34 (*)    Anion gap 20 (*)    All other components within normal limits  MAGNESIUM - Abnormal; Notable for the following components:   Magnesium 1.0 (*)    All  other components within normal limits  LACTIC ACID, PLASMA - Abnormal; Notable for the following components:   Lactic Acid, Venous 3.1 (*)    All other components within normal limits  RESP PANEL BY RT-PCR (FLU A&B, COVID) ARPGX2  CULTURE, BLOOD (ROUTINE X 2)  CULTURE, BLOOD (ROUTINE X 2)  URINE CULTURE  GASTROINTESTINAL PANEL BY PCR, STOOL (REPLACES STOOL CULTURE)  C DIFFICILE QUICK SCREEN W PCR REFLEX    LIPASE, BLOOD  T4, FREE  TSH  HEPATITIS PANEL, ACUTE  LACTIC ACID, PLASMA  URINALYSIS, COMPLETE (UACMP) WITH MICROSCOPIC     EKG Interpreted by me Sinus tachycardia rate of 152.  Normal axis, slightly prolonged QTc of 502 ms.  Normal QRS.  Slight lateral ST depression, likely rate related.  Normal T waves   RADIOLOGY CT on pelvis interpreted by me, no signs of bowel obstruction or pancreatitis.  There is a gallstone.  Radiology report reviewed  Ultrasound right upper quadrant negative for cholecystitis.   MRCP negative for obstruction  PROCEDURES:  Procedures   MEDICATIONS ORDERED IN ED: Medications  sodium chloride 0.9 % bolus 1,000 mL (0 mLs Intravenous Stopped 01/03/22 1155)   magnesium sulfate IVPB 2 g 50 mL (0 g Intravenous Stopped 01/03/22 1155)  ondansetron (ZOFRAN) injection 4 mg (4 mg Intravenous Given 01/03/22 1042)  pantoprazole (PROTONIX) injection 40 mg (40 mg Intravenous Given 01/03/22 1042)  iohexol (OMNIPAQUE) 300 MG/ML solution 75 mL (60 mLs Intravenous Contrast Given 01/03/22 1040)  lactated ringers bolus 1,000 mL (1,000 mLs Intravenous New Bag/Given 01/03/22 1155)  gadobutrol (GADAVIST) 1 MMOL/ML injection 7 mL (7 mLs Intravenous Contrast Given 01/03/22 1329)     IMPRESSION / MDM / ASSESSMENT AND PLAN / ED COURSE  I reviewed the triage vital signs and the nursing notes.                              Differential diagnosis includes, but is not limited to, pancreatitis, diverticulitis, gastroenteritis, viral syndrome, dehydration, electrolyte abnormality, AKI, anemia  Patient's presentation is most consistent with acute presentation with potential threat to life or bodily function.  Patient presents with tachycardia tachypnea.  Nontoxic, doubt sepsis but will obtain cultures and lactate along with serum labs.  We will give IV fluids Protonix and Zofran for hydration and symptom relief.  Will obtain CT abdomen pelvis.   ----------------------------------------- 2:04 PM on 01/03/2022 ----------------------------------------- CT shows gallstones but otherwise unremarkable.  Ultrasound right upper quadrant negative for cholecystitis.  MRCP also negative for biliary obstruction.  Feeling better and tolerating p.o. and stable for discharge      FINAL CLINICAL IMPRESSION(S) / ED DIAGNOSES   Final diagnoses:  Dehydration  Hypomagnesemia  Calculus of gallbladder without cholecystitis without obstruction     Rx / DC Orders   ED Discharge Orders          Ordered    ondansetron (ZOFRAN-ODT) 4 MG disintegrating tablet  Every 8 hours PRN        01/03/22 1403    famotidine (PEPCID) 20 MG tablet  2 times daily        01/03/22 1403              Note:  This document was prepared using Dragon voice recognition software and may include unintentional dictation errors.   Carrie Mew, MD 01/03/22 [phone]

## 2022-01-03 NOTE — ED Triage Notes (Signed)
Patient reports her PCP sent her here for repeat labs due to abnormal labs. Patient c/o N/V and chronic back pain from fractures per patient.

## 2022-01-03 NOTE — Discharge Instructions (Signed)
Your imaging tests showed gallstones, but no signs of obstruction or infection in your gallbladder or bile duct.  Your lab test showed low magnesium level and signs of dehydration, which we improved with IV fluids IV magnesium.  Continue taking Zofran and famotidine as needed to control your symptoms and several of your stomach and follow-up with general surgery for further evaluation of the gallstones.

## 2022-01-04 LAB — HEPATITIS PANEL, ACUTE: Hep A IgM: NEGATIVE — AB

## 2022-01-04 LAB — HCV INTERPRETATION

## 2022-01-05 LAB — URINE CULTURE: Culture: 30000 — AB

## 2022-01-08 LAB — CULTURE, BLOOD (ROUTINE X 2)
Culture: NO GROWTH
Culture: NO GROWTH
Special Requests: ADEQUATE

## 2022-01-11 ENCOUNTER — Ambulatory Visit: Payer: Medicare HMO | Admitting: Neurosurgery

## 2022-01-29 NOTE — Progress Notes (Unsigned)
Referring Physician:  Geronimo Boot, PA-C [address] [place],  [place] [address]  Primary Physician:  Romualdo Bolk, FNP  History of Present Illness: 01/29/2022 [name] is here today with a chief complaint of low back pain that radiates into the left leg pain that radiates into the foot. Also complains of right foot pain.  She has been having back trouble for many years.  Over the past several months, she suffered multiple compression fractures.  She is also had severe issues with nausea and vomiting.  She has times where she is unable to eat for more than a week at a time.  She reports pain as bad as 10 out of 10 made worse by walking and standing and lifting.  Laying down makes it better.   Bowel/Bladder Dysfunction: none  Conservative measures:  Physical therapy: has not participated in Multimodal medical therapy including regular antiinflammatories:  flexeril, lidocaine patch, oxycodone, tylenol Injections: has not received any epidural steroid injections  Past Surgery:  Thoracic cyst removal around 2010.   Lennie Hummer has no symptoms of cervical myelopathy.  The symptoms are causing a significant impact on the patient's life.   Progress Note from Geronimo Boot, [place] on 12/14/21:  Chief Complaint:  f/u for multiple thoracic compression fractures and L3 compression fracture   History of Present Illness: Shirley Evans is a 56 y.o. female who presents for multiple thoracic compression fractures and L3 compression fracture DOI 08/27/21.    Xrays at her last visit showed some progression of T12 fracture at last visit. She had not been wearing brace and was reminded to do this. MRI lumbar spine also ordered at last visit.    She is here for follow up to review lumbar MRI and repeat thoracic/lumbar xrays.    She has been sick for last 2-3 weeks and was dry heaving. She has severe pain in her mid back. This is worse that it has been. No radiation of  pain to arms or around chest. No weakness in arms/legs.    She has chronic LBP x years. Some pain in left lateral thigh, but she also had recent left hips surgery. She has numbness in her toes. She feels like her LBP is at her baseline.   Review of Systems:  A 10 point review of systems is negative, except for the pertinent positives and negatives detailed in the HPI.  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    Social History: Social History   Tobacco Use   Smoking status: Former  Packs/day: 1.00    Years: 30.00    Total pack years: 30.00    Types: Cigarettes   Smokeless tobacco: Never  Vaping Use   Vaping Use: Never used  Substance Use Topics   Alcohol use: Not Currently    Alcohol/week: 8.0 standard drinks of alcohol    Types: 8 Cans of beer per week   Drug use: Yes    Types: Marijuana    Comment: CBD  gummy, prescribed oxy    Family Medical History: Family History  Problem Relation Age of Onset   Diabetes Mother    Hypertension Mother    Breast cancer  Paternal Aunt 24   Hypertension Father     Physical Examination: There were no vitals filed for this visit.  General: Patient is well developed, well nourished, calm, collected, and in no apparent distress. Attention to examination is appropriate.  Neck:   Supple.  Full range of motion.  Respiratory: Patient is breathing without any difficulty.   NEUROLOGICAL:     Awake, alert, oriented to person, place, and time.  Speech is clear and fluent. Fund of knowledge is appropriate.   Cranial Nerves: Pupils equal round and reactive to light.  Facial tone is symmetric.  Facial sensation is symmetric. Shoulder shrug is symmetric. Tongue protrusion is midline.    ROM of spine: full.    Strength: Side Biceps Triceps Deltoid Interossei Grip Wrist Ext. Wrist Flex.  R '5 5 5 5 5 5 5  '$ L '5 5 5 5 5 5 5   '$ Side Iliopsoas Quads Hamstring PF DF EHL  R '5 5 5 5 5 5  '$ L '5 5 5 5 5 5   '$ Reflexes are 1+ and symmetric at the biceps, triceps, brachioradialis, patella and achilles.   Hoffman's is absent.   Bilateral upper and lower extremity sensation is intact to light touch.    No evidence of dysmetria noted.  Gait is normal.     Medical Decision Making  Imaging: MRI L spine 12/02/21 IMPRESSION: 1. Progressive loss of vertebral body height and progressive osseous retropulsion at the T12 fracture compared with CT of 08/25/2021. This fracture does not appear significantly changed from the more recent radiographs of 10/03/2021, but there is persistent associated bone marrow edema. 2. The mild fractures involving the superior endplate of Z00 and L3 appear unchanged. No new fractures. 3. Superimposed spondylosis, increased from remote MRI, but similar to more recent CTs. 4. Mild multifactorial spinal stenosis at L4-5 due to disc bulging, facet and ligamentous hypertrophy and prominent epidural fat. 5. Mild multifactorial spinal stenosis with osseous foraminal narrowing bilaterally at L5-S1.      Electronically Signed   By: Richardean Sale M.D.   On: 12/04/2021 11:43  Scoliosis films 12/14/21 IMPRESSION: 1. Remote appearing superior endplate fractures of P23 and T11 with 10-20% loss of height, unchanged 2. Remote anterior wedge compression deformity of T12 with approximately 50% loss of height, unchanged. Stable 3-4 mm retropulsion of the posterosuperior aspect of T12. No listhesis. 3. 5 mm retrolisthesis L1 upon L2, likely degenerative in nature.     Electronically Signed   By: Fidela Salisbury M.D.   On: 12/17/2021 00:00  I have personally reviewed the images and agree with the above interpretation.  Assessment and Plan: Ms. Steptoe is a pleasant 56 y.o. 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  She has osteopenia.  She is not currently undergoing treatment.  She has severe issues with gastrointestinal discomfort, nausea,  and vomiting.  She is currently undergoing work-up for this.  She has shown worsening of her thoracic kyphosis over time.  1 could consider surgical intervention for this, but I do not think she is appropriate to consider that at this time.  She will need to be stabilized from a gastrointestinal perspective before considering this.  I am very concerned that her nutritional status has been worsening over time and she would be unable to recover from a large surgery.  I think she should see her primary care provider to discuss ongoing pain management as well as treatment of her osteopenia.  I let her know I would give her a very small course of pain medications as they previously helped her and she has shown good stewardship of her prior prescription.  I think she should discuss this with her primary care provider.  I spent a total of 30 minutes in face-to-face and non-face-to-face activities related to this patient's care today.  Thank you for involving me in the care of this patient.      Ranisha Allaire K. Izora Ribas MD,  [facility]surgery

## 2022-01-30 ENCOUNTER — Encounter: Payer: Self-pay | Admitting: Neurosurgery

## 2022-01-30 ENCOUNTER — Ambulatory Visit (INDEPENDENT_AMBULATORY_CARE_PROVIDER_SITE_OTHER): Payer: Medicare HMO | Admitting: Neurosurgery

## 2022-01-30 VITALS — BP 130/82 | Ht 64.0 in | Wt 141.4 lb

## 2022-01-30 DIAGNOSIS — S22080D Wedge compression fracture of T11-T12 vertebra, subsequent encounter for fracture with routine healing: Secondary | ICD-10-CM | POA: Diagnosis not present

## 2022-01-30 DIAGNOSIS — S32030D Wedge compression fracture of third lumbar vertebra, subsequent encounter for fracture with routine healing: Secondary | ICD-10-CM | POA: Diagnosis not present

## 2022-01-30 DIAGNOSIS — S22080S Wedge compression fracture of T11-T12 vertebra, sequela: Secondary | ICD-10-CM

## 2022-01-30 DIAGNOSIS — M40204 Unspecified kyphosis, thoracic region: Secondary | ICD-10-CM

## 2022-01-30 MED ORDER — OXYCODONE-ACETAMINOPHEN 5-325 MG PO TABS: 1.0000 | ORAL_TABLET | ORAL | 0 refills | Status: DC | PRN

## 2022-01-30 NOTE — Progress Notes (Signed)
Patient ID: Shirley Evans, female   DOB: [birth year], 56 y.o.   MRN: [id number]  Chief Complaint: Here to schedule cholecystectomy  History of Present Illness Shirley Evans is a 56 y.o. female with an MRCP for abnormal LFTs about a month ago.  Gets sick rather frequently, seems to think it is more associated with tomato and spicy foods anything else.  When asked how to help her liver get better, she indicates she could quit drinking as much.  Otherwise poor historian. .  Past Medical History Past Medical History:  Diagnosis Date   Cancer ([place])    cervical   COPD (chronic obstructive pulmonary disease) (HCC)    GERD (gastroesophageal reflux disease)    History of Helicobacter pylori infection    Hypertension    Menopausal state    Osteopenia    Postmenopausal atrophic vaginitis       Past Surgical History:  Procedure Laterality Date   BREAST BIOPSY Bilateral    neg   TOTAL HIP ARTHROPLASTY Left 12/22/2020   Procedure: TOTAL HIP ARTHROPLASTY ANTERIOR APPROACH;  Surgeon: Hessie Knows, MD;  Location: [facility] ORS;  Service: Orthopedics;  Laterality: Left;    Allergies  Allergen Reactions   Demerol [Meperidine] Nausea Only and Nausea And Vomiting   Codeine Nausea Only    Current Outpatient Medications  Medication Sig Dispense Refill   albuterol (VENTOLIN HFA) 108 (90 Base) MCG/ACT inhaler Inhale into the lungs.     cholecalciferol (VITAMIN D3) 25 MCG (1000 UNIT) tablet Take 1,000 Units by mouth daily.     cyclobenzaprine (FLEXERIL) 10 MG tablet Take 10 mg by mouth daily.     dicyclomine (BENTYL) 10 MG capsule Take 1 capsule (10 mg total) by mouth 3 (three) times daily as needed (abd pain). 15 capsule 0   lidocaine (LIDODERM) 5 % Place onto the skin.     lisinopril-hydrochlorothiazide (ZESTORETIC) 20-12.5 MG tablet Take 1 tablet by mouth daily.     loperamide (IMODIUM) 2 MG capsule Take 1 capsule (2 mg total) by mouth as needed for diarrhea or loose stools. 30 capsule 0    magnesium gluconate (MAGONATE) 500 MG tablet Take 1 tablet (500 mg total) by mouth 2 (two) times daily. 60 tablet 1   omeprazole (PRILOSEC) 20 MG capsule Take 1 capsule (20 mg total) by mouth 2 (two) times daily before a meal. 60 capsule 11   ondansetron (ZOFRAN-ODT) 4 MG disintegrating tablet Take 1 tablet (4 mg total) by mouth every 8 (eight) hours as needed for nausea or vomiting. 20 tablet 0   oxyCODONE-acetaminophen (PERCOCET) 5-325 MG tablet Take 1 tablet by mouth every 8 (eight) hours as needed for severe pain. 15 tablet 0   oxyCODONE-acetaminophen (PERCOCET) 5-325 MG tablet Take 1 tablet by mouth every 4 (four) hours as needed for severe pain. 10 tablet 0   sertraline (ZOLOFT) 100 MG tablet Take 100 mg by mouth daily.     valACYclovir (VALTREX) 1000 MG tablet Take 1,000 mg by mouth daily.     albuterol (PROVENTIL) (2.5 MG/3ML) 0.083% nebulizer solution Inhale into the lungs.     buPROPion (WELLBUTRIN) 75 MG tablet Take 75 mg by mouth 3 (three) times daily.     rosuvastatin (CRESTOR) 40 MG tablet Take 40 mg by mouth daily.     No current facility-administered medications for this visit.    Family History Family History  Problem Relation Age of Onset   Diabetes Mother    Hypertension Mother    Breast cancer  Paternal Aunt 95   Hypertension Father       Social History Social History   Tobacco Use   Smoking status: Former    Packs/day: 1.00    Years: 30.00    Total pack years: 30.00    Types: Cigarettes   Smokeless tobacco: Never  Vaping Use   Vaping Use: Never used  Substance Use Topics   Alcohol use: Not Currently    Alcohol/week: 8.0 standard drinks of alcohol    Types: 8 Cans of beer per week   Drug use: Yes    Types: Marijuana    Comment: CBD  gummy, prescribed oxy        Review of Systems  Constitutional:  Positive for chills, fever, malaise/fatigue and weight loss.  HENT: Negative.    Eyes: Negative.   Respiratory:  Positive for shortness of breath and  wheezing.   Cardiovascular: Negative.   Gastrointestinal:  Positive for abdominal pain, diarrhea, heartburn, nausea and vomiting.  Genitourinary: Negative.   Skin: Negative.   Neurological:  Positive for headaches.  Psychiatric/Behavioral:  Positive for depression.      Physical Exam Blood pressure (!) 190/151, pulse (!) 121, temperature 97.8 F (36.6 C), temperature source Oral, height '5\' 3"'$  (1.6 m), weight 139 lb 12.8 oz (63.4 kg), SpO2 98 %. Last Weight  Most recent update: 02/01/2022  1:33 PM    Weight  63.4 kg (139 lb 12.8 oz)             CONSTITUTIONAL: Well developed, and nourished, appropriately responsive and aware without distress.   EYES: Sclera non-icteric.   EARS, NOSE, MOUTH AND THROAT:  The oropharynx is clear. Oral mucosa is pink and moist.    Hearing is intact to voice.  NECK: Trachea is midline, and there is no jugular venous distension.  LYMPH NODES:  Lymph nodes in the neck are not appreciated. RESPIRATORY:  Lungs are clear, and breath sounds are equal bilaterally. Normal respiratory effort without pathologic use of accessory muscles. CARDIOVASCULAR: Heart is regular in rate and rhythm.  Well perfused.  GI: The abdomen is  soft, nontender, and nondistended. There were no palpable masses. I did not appreciate hepatosplenomegaly. There were normal bowel sounds. MUSCULOSKELETAL:  Symmetrical muscle tone appreciated in all four extremities.    SKIN: Skin turgor is normal. No pathologic skin lesions appreciated.  NEUROLOGIC:  Motor and sensation appear grossly normal.  Cranial nerves are grossly without defect. PSYCH:  Alert and oriented to person, place and time. Affect is appropriate for situation.  Data Reviewed I have personally reviewed what is currently available of the patient's imaging, recent labs and medical records.   Labs:     Latest Ref Rng & Units 01/03/2022    9:25 AM 12/12/2021   10:23 AM 08/31/2021    4:22 AM  CBC  WBC 4.0 - 10.5 K/uL 13.5  11.3   9.0   Hemoglobin 12.0 - 15.0 g/dL 16.8  14.7  14.5   Hematocrit 36.0 - 46.0 % 47.6  41.8  43.5   Platelets 150 - 400 K/uL 343  277  208       Latest Ref Rng & Units 01/03/2022    9:25 AM 12/12/2021   10:23 AM 09/01/2021    4:16 AM  CMP  Glucose 70 - 99 mg/dL 236  113  119   BUN 6 - 20 mg/dL '19  19  18   '$ Creatinine 0.44 - 1.00 mg/dL 1.72  0.64  1.05  Sodium 135 - 145 mmol/L 135  135  135   Potassium 3.5 - 5.1 mmol/L 3.9  3.6  4.3   Chloride 98 - 111 mmol/L 94  100  98   CO2 22 - 32 mmol/L '21  17  30   '$ Calcium 8.9 - 10.3 mg/dL 10.6  10.1  9.9   Total Protein 6.5 - 8.1 g/dL 8.6  7.8    Total Bilirubin 0.3 - 1.2 mg/dL 3.5  2.7    Alkaline Phos 38 - 126 U/L 130  134    AST 15 - 41 U/L 118  357    ALT 0 - 44 U/L 46  176       Imaging: Radiological images reviewed:  CLINICAL DATA:  Elevated liver function studies.  Cholelithiasis.   EXAM: MRI ABDOMEN WITHOUT AND WITH CONTRAST (INCLUDING MRCP)   TECHNIQUE: Multiplanar multisequence MR imaging of the abdomen was performed both before and after the administration of intravenous contrast. Heavily T2-weighted images of the biliary and pancreatic ducts were obtained, and three-dimensional MRCP images were rendered by post processing.   CONTRAST:  51m GADAVIST GADOBUTROL 1 MMOL/ML IV SOLN   COMPARISON:  CT and ultrasound examinations, same date.   FINDINGS: Lower chest: The lung bases are clear of an acute process. No pleural or pericardial effusion.   Hepatobiliary: Diffuse and fairly marked fatty infiltration of the liver but no hepatic lesions or intrahepatic biliary dilatation. Several small layering gallstones are noted but no gallbladder distension, gallbladder thickening, mucosal enhancement and more pericholecystic inflammatory changes to suggest acute cholecystitis. Normal caliber and course of the common bile duct. No common bile duct stones are identified.   Pancreas:  No mass, inflammation or ductal dilatation.    Spleen:  Normal size.  No focal lesions.   Adrenals/Urinary Tract: Adrenal glands and kidneys are unremarkable. No worrisome renal lesions or hydronephrosis.   Stomach/Bowel: The stomach, duodenum, visualized small bowel visualized colon are unremarkable   Vascular/Lymphatic: The aorta and branch vessels are patent. The major venous structures are patent. No mesenteric or retroperitoneal mass or adenopathy.   Other:  No ascites or abdominal wall hernia.   Musculoskeletal: No significant bony findings. Remote lower thoracic compression fractures and advanced degenerative disc disease at L5-S1.   IMPRESSION: 1. Diffuse and fairly marked fatty infiltration of the liver but no hepatic lesions or intrahepatic biliary dilatation. 2. Several small layering gallstones in the gallbladder but no MR findings for acute cholecystitis. 3. Normal caliber and course of the common bile duct. No common bile duct stones. 4. Remote lower thoracic compression fractures and advanced degenerative disc disease at L5-S1.     Electronically Signed   By: PMarijo SanesM.D.   On: 01/03/2022 13:39 Within last 24 hrs: No results found.  Assessment    Chronic calculus cholecystitis with biliary colic  Patient Active Problem List   Diagnosis Date Noted   Chronic diarrhea 08/28/2021   Thrombocytopenia ([place] 08/27/2021   Hypophosphatemia 08/27/2021   AKI (acute kidney injury) ([place] 08/25/2021   Vertebral fracture, osteoporotic, initial encounter [place] 08/25/2021   Acute gastroenteritis 08/25/2021   Depression 08/25/2021   Sepsis ([place] 08/25/2021   Overweight (BMI 25.0-29.9) 08/11/2021   Intractable nausea and vomiting 08/10/2021   Hypertensive urgency 08/10/2021   Dyslipidemia 08/10/2021   GERD (gastroesophageal reflux disease) 08/10/2021   Alcohol abuse 08/10/2021   Elevated LFTs 08/10/2021   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  This was discussed thoroughly.  Optimal plan is for robotic cholecystectomy utilizing ICG imaging. Risks and benefits have been discussed with the patient which include but are not limited to anesthesia, bleeding, infection, biliary ductal injury, resulting in leak or stenosis, other associated unanticipated injuries affiliated with laparoscopic surgery.   Reviewed that removing the gallbladder will only address the symptoms related to the gallbladder itself.  I believe there is the desire to proceed, accepting the risks with understanding.  Questions elicited and answered to satisfaction.    No guarantees ever expressed or implied.   Face-to-face time spent with the patient and accompanying care providers(if present) was 40 minutes, with more than 50% of the time spent counseling, educating, and coordinating care of the patient.    These notes generated with voice recognition software. I apologize for typographical errors.  Ronny Bacon M.D., FACS 02/01/2022, 2:05 PM

## 2022-02-01 ENCOUNTER — Encounter: Payer: Self-pay | Admitting: Surgery

## 2022-02-01 ENCOUNTER — Ambulatory Visit: Payer: Self-pay | Admitting: Surgery

## 2022-02-01 ENCOUNTER — Ambulatory Visit (INDEPENDENT_AMBULATORY_CARE_PROVIDER_SITE_OTHER): Payer: Medicare HMO | Admitting: Surgery

## 2022-02-01 VITALS — BP 190/151 | HR 121 | Temp 97.8°F | Ht 63.0 in | Wt 139.8 lb

## 2022-02-01 DIAGNOSIS — K8010 Calculus of gallbladder with chronic cholecystitis without obstruction: Secondary | ICD-10-CM | POA: Diagnosis not present

## 2022-02-01 NOTE — Patient Instructions (Signed)
Our surgery scheduler Barbara will call you within 24-48 hours to get you scheduled. If you have not heard from her after 48 hours, please call our office. Have the blue sheet available when she calls to write down important information.   If you have any concerns or questions, please feel free to call our office.    Minimally Invasive Cholecystectomy  Minimally invasive cholecystectomy is surgery to remove the gallbladder. The gallbladder is a pear-shaped organ that lies beneath the liver on the right side of the body. The gallbladder stores bile, which is a fluid that helps the body digest fats. Cholecystectomy is often done to treat inflammation (irritation and swelling) of the gallbladder (cholecystitis). This condition is usually caused by a buildup of gallstones (cholelithiasis) in the gallbladder or when the fluid in the gall bladder becomes stagnant because gallstones get stuck in the ducts (tubes) and block the flow of bile. This can result in inflammation and pain. In severe cases, emergency surgery may be required. This procedure is done through small incisions in the abdomen, instead of one large incision. It is also called laparoscopic surgery. A thin scope with a camera (laparoscope) is inserted through one incision. Then surgical instruments are inserted through the other incisions. In some cases, a minimally invasive surgery may need to be changed to a surgery that is done through a larger incision. This is called open surgery. Tell a health care provider about: Any allergies you have. All medicines you are taking, including vitamins, herbs, eye drops, creams, and over-the-counter medicines. Any problems you or family members have had with anesthetic medicines. Any bleeding problems you have. Any surgeries you have had. Any medical conditions you have. Whether you are pregnant or may be pregnant. What are the risks? Generally, this is a safe procedure. However, problems may occur,  including: Infection. Bleeding. Allergic reactions to medicines. Damage to nearby structures or organs. A gallstone remaining in the common bile duct. The common bile duct carries bile from the gallbladder to the small intestine. A bile leak from the liver or cystic duct after your gallbladder is removed. What happens before the procedure? When to stop eating and drinking Follow instructions from your health care provider about what you may eat and drink before your procedure. These may include: 8 hours before the procedure Stop eating most foods. Do not eat meat, fried foods, or fatty foods. Eat only light foods, such as toast or crackers. All liquids are okay except energy drinks and alcohol. 6 hours before the procedure Stop eating. Drink only clear liquids, such as water, clear fruit juice, black coffee, plain tea, and sports drinks. Do not drink energy drinks or alcohol. 2 hours before the procedure Stop drinking all liquids. You may be allowed to take medicines with small sips of water. If you do not follow your health care provider's instructions, your procedure may be delayed or canceled. Medicines Ask your health care provider about: Changing or stopping your regular medicines. This is especially important if you are taking diabetes medicines or blood thinners. Taking medicines such as aspirin and ibuprofen. These medicines can thin your blood. Do not take these medicines unless your health care provider tells you to take them. Taking over-the-counter medicines, vitamins, herbs, and supplements. General instructions If you will be going home right after the procedure, plan to have a responsible adult: Take you home from the hospital or clinic. You will not be allowed to drive. Care for you for the time you are   told. Do not use any products that contain nicotine or tobacco for at least 4 weeks before the procedure. These products include cigarettes, chewing tobacco, and vaping  devices, such as e-cigarettes. If you need help quitting, ask your health care provider. Ask your health care provider: How your surgery site will be marked. What steps will be taken to help prevent infection. These may include: Removing hair at the surgery site. Washing skin with a germ-killing soap. Taking antibiotic medicine. What happens during the procedure?  An IV will be inserted into one of your veins. You will be given one or both of the following: A medicine to help you relax (sedative). A medicine to make you fall asleep (general anesthetic). Your surgeon will make several small incisions in your abdomen. The laparoscope will be inserted through one of the small incisions. The camera on the laparoscope will send images to a monitor in the operating room. This lets your surgeon see inside your abdomen. A gas will be pumped into your abdomen. This will expand your abdomen to give the surgeon more room to perform the surgery. Other tools that are needed for the procedure will be inserted through the other incisions. The gallbladder will be removed through one of the incisions. Your common bile duct may be examined. If stones are found in the common bile duct, they may be removed. After your gallbladder has been removed, the incisions will be closed with stitches (sutures), staples, or skin glue. Your incisions will be covered with a bandage (dressing). The procedure may vary among health care providers and hospitals. What happens after the procedure? Your blood pressure, heart rate, breathing rate, and blood oxygen level will be monitored until you leave the hospital or clinic. You will be given medicines as needed to control your pain. You may have a drain placed in the incision. The drain will be removed a day or two after the procedure. Summary Minimally invasive cholecystectomy, also called laparoscopic cholecystectomy, is surgery to remove the gallbladder using small  incisions. Tell your health care provider about all the medical conditions you have and all the medicines you are taking for those conditions. Before the procedure, follow instructions about when to stop eating and drinking and changing or stopping medicines. Plan to have a responsible adult care for you for the time you are told after you leave the hospital or clinic. This information is not intended to replace advice given to you by your health care provider. Make sure you discuss any questions you have with your health care provider. Document Revised: 09/13/2020 Document Reviewed: 09/13/2020 Elsevier Patient Education  2023 [employer]  

## 2022-02-02 ENCOUNTER — Other Ambulatory Visit: Payer: Self-pay

## 2022-02-02 ENCOUNTER — Inpatient Hospital Stay: Payer: Medicare HMO | Admitting: Registered Nurse

## 2022-02-02 ENCOUNTER — Encounter
Admission: EM | Disposition: A | Discharge status: Home / Self Care | Payer: Self-pay | Source: Home / Self Care | Attending: Emergency Medicine

## 2022-02-02 ENCOUNTER — Observation Stay
Admission: EM | Admit: 2022-02-02 | Discharge: 2022-02-03 | Disposition: A | Discharge status: Home / Self Care | Payer: Medicare HMO | Attending: Internal Medicine | Admitting: Internal Medicine

## 2022-02-02 ENCOUNTER — Encounter: Payer: Self-pay | Admitting: Internal Medicine

## 2022-02-02 ENCOUNTER — Emergency Department: Payer: Medicare HMO

## 2022-02-02 DIAGNOSIS — K8050 Calculus of bile duct without cholangitis or cholecystitis without obstruction: Secondary | ICD-10-CM

## 2022-02-02 DIAGNOSIS — R197 Diarrhea, unspecified: Secondary | ICD-10-CM | POA: Insufficient documentation

## 2022-02-02 DIAGNOSIS — K8010 Calculus of gallbladder with chronic cholecystitis without obstruction: Principal | ICD-10-CM

## 2022-02-02 DIAGNOSIS — N179 Acute kidney failure, unspecified: Secondary | ICD-10-CM | POA: Diagnosis not present

## 2022-02-02 DIAGNOSIS — K219 Gastro-esophageal reflux disease without esophagitis: Secondary | ICD-10-CM | POA: Diagnosis present

## 2022-02-02 DIAGNOSIS — R7989 Other specified abnormal findings of blood chemistry: Secondary | ICD-10-CM | POA: Diagnosis not present

## 2022-02-02 DIAGNOSIS — I10 Essential (primary) hypertension: Secondary | ICD-10-CM | POA: Insufficient documentation

## 2022-02-02 DIAGNOSIS — R112 Nausea with vomiting, unspecified: Secondary | ICD-10-CM | POA: Diagnosis present

## 2022-02-02 DIAGNOSIS — R12 Heartburn: Secondary | ICD-10-CM | POA: Diagnosis not present

## 2022-02-02 DIAGNOSIS — J449 Chronic obstructive pulmonary disease, unspecified: Secondary | ICD-10-CM | POA: Diagnosis not present

## 2022-02-02 DIAGNOSIS — K529 Noninfective gastroenteritis and colitis, unspecified: Secondary | ICD-10-CM | POA: Diagnosis not present

## 2022-02-02 DIAGNOSIS — F32A Depression, unspecified: Secondary | ICD-10-CM | POA: Diagnosis not present

## 2022-02-02 DIAGNOSIS — R531 Weakness: Secondary | ICD-10-CM | POA: Insufficient documentation

## 2022-02-02 DIAGNOSIS — Z96642 Presence of left artificial hip joint: Secondary | ICD-10-CM | POA: Diagnosis not present

## 2022-02-02 DIAGNOSIS — E876 Hypokalemia: Secondary | ICD-10-CM | POA: Insufficient documentation

## 2022-02-02 DIAGNOSIS — I160 Hypertensive urgency: Secondary | ICD-10-CM | POA: Diagnosis not present

## 2022-02-02 DIAGNOSIS — Z87891 Personal history of nicotine dependence: Secondary | ICD-10-CM | POA: Insufficient documentation

## 2022-02-02 DIAGNOSIS — Z8541 Personal history of malignant neoplasm of cervix uteri: Secondary | ICD-10-CM | POA: Insufficient documentation

## 2022-02-02 DIAGNOSIS — Z79899 Other long term (current) drug therapy: Secondary | ICD-10-CM | POA: Diagnosis not present

## 2022-02-02 LAB — COMPREHENSIVE METABOLIC PANEL
ALT: 124 U/L — ABNORMAL HIGH (ref 0–44)
ALT: 78 U/L — ABNORMAL HIGH (ref 0–44)
AST: 195 U/L — ABNORMAL HIGH (ref 15–41)
AST: 140 U/L — ABNORMAL HIGH (ref 15–41)
Albumin: 3.9 g/dL (ref 3.5–5.0)
Albumin: 2.9 g/dL — ABNORMAL LOW (ref 3.5–5.0)
Alkaline Phosphatase: 244 U/L — ABNORMAL HIGH (ref 38–126)
Alkaline Phosphatase: 168 U/L — ABNORMAL HIGH (ref 38–126)
Anion gap: 19 — ABNORMAL HIGH (ref 5–15)
Anion gap: 15 (ref 5–15)
BUN: 7 mg/dL (ref 6–20)
BUN: 7 mg/dL (ref 6–20)
CO2: 27 mmol/L (ref 22–32)
CO2: 25 mmol/L (ref 22–32)
Calcium: 8.7 mg/dL — ABNORMAL LOW (ref 8.9–10.3)
Calcium: 7.2 mg/dL — ABNORMAL LOW (ref 8.9–10.3)
Chloride: 88 mmol/L — ABNORMAL LOW (ref 98–111)
Chloride: 94 mmol/L — ABNORMAL LOW (ref 98–111)
Creatinine, Ser: 1.11 mg/dL — ABNORMAL HIGH (ref 0.44–1.00)
Creatinine, Ser: 0.87 mg/dL (ref 0.44–1.00)
GFR, Estimated: 58 mL/min — ABNORMAL LOW (ref >60)
GFR, Estimated: >60 mL/min (ref >60)
Glucose, Bld: 234 mg/dL — ABNORMAL HIGH (ref 70–99)
Glucose, Bld: 205 mg/dL — ABNORMAL HIGH (ref 70–99)
Potassium: 3.1 mmol/L — ABNORMAL LOW (ref 3.5–5.1)
Potassium: 2.6 mmol/L — CL (ref 3.5–5.1)
Sodium: 134 mmol/L — ABNORMAL LOW (ref 135–145)
Sodium: 134 mmol/L — ABNORMAL LOW (ref 135–145)
Total Bilirubin: 3.9 mg/dL — ABNORMAL HIGH (ref 0.3–1.2)
Total Bilirubin: 2 mg/dL — ABNORMAL HIGH (ref 0.3–1.2)
Total Protein: 8.2 g/dL — ABNORMAL HIGH (ref 6.5–8.1)
Total Protein: 6.2 g/dL — ABNORMAL LOW (ref 6.5–8.1)

## 2022-02-02 LAB — URINALYSIS, COMPLETE (UACMP) WITH MICROSCOPIC
Bacteria, UA: NONE SEEN
Bilirubin Urine: NEGATIVE
Glucose, UA: 150 mg/dL — AB
Ketones, ur: NEGATIVE mg/dL
Leukocytes,Ua: NEGATIVE
Nitrite: NEGATIVE
Protein, ur: 100 mg/dL — AB
Specific Gravity, Urine: 1.006 (ref 1.005–1.030)
pH: 8 (ref 5.0–8.0)

## 2022-02-02 LAB — CBC WITH DIFFERENTIAL/PLATELET
Abs Immature Granulocytes: 0.09 10*3/uL — ABNORMAL HIGH (ref 0.00–0.07)
Basophils Absolute: 0 10*3/uL (ref 0.0–0.1)
Basophils Relative: 0 %
Eosinophils Absolute: 0 10*3/uL (ref 0.0–0.5)
Eosinophils Relative: 0 %
HCT: 40.5 % (ref 36.0–46.0)
Hemoglobin: 14.3 g/dL (ref 12.0–15.0)
Immature Granulocytes: 1 %
Lymphocytes Relative: 7 %
Lymphs Abs: 0.9 10*3/uL (ref 0.7–4.0)
MCH: 35 pg — ABNORMAL HIGH (ref 26.0–34.0)
MCHC: 35.3 g/dL (ref 30.0–36.0)
MCV: 99.3 fL (ref 80.0–100.0)
Monocytes Absolute: 0.5 10*3/uL (ref 0.1–1.0)
Monocytes Relative: 3 %
Neutro Abs: 12.7 10*3/uL — ABNORMAL HIGH (ref 1.7–7.7)
Neutrophils Relative %: 89 %
Platelets: 196 10*3/uL (ref 150–400)
RBC: 4.08 MIL/uL (ref 3.87–5.11)
RDW: 13.2 % (ref 11.5–15.5)
WBC: 14.3 10*3/uL — ABNORMAL HIGH (ref 4.0–10.5)
nRBC: 0 % (ref 0.0–0.2)

## 2022-02-02 LAB — URINE DRUG SCREEN, QUALITATIVE (ARMC ONLY)
Amphetamines, Ur Screen: NOT DETECTED
Barbiturates, Ur Screen: NOT DETECTED
Benzodiazepine, Ur Scrn: NOT DETECTED
Cannabinoid 50 Ng, Ur ~~LOC~~: NOT DETECTED
Cocaine Metabolite,Ur ~~LOC~~: NOT DETECTED
MDMA (Ecstasy)Ur Screen: NOT DETECTED
Methadone Scn, Ur: NOT DETECTED
Opiate, Ur Screen: NOT DETECTED
Phencyclidine (PCP) Ur S: NOT DETECTED
Tricyclic, Ur Screen: NOT DETECTED

## 2022-02-02 LAB — CBC
HCT: 48.4 % — ABNORMAL HIGH (ref 36.0–46.0)
Hemoglobin: 17.3 g/dL — ABNORMAL HIGH (ref 12.0–15.0)
MCH: 34.7 pg — ABNORMAL HIGH (ref 26.0–34.0)
MCHC: 35.7 g/dL (ref 30.0–36.0)
MCV: 97 fL (ref 80.0–100.0)
Platelets: 274 10*3/uL (ref 150–400)
RBC: 4.99 MIL/uL (ref 3.87–5.11)
RDW: 13.2 % (ref 11.5–15.5)
WBC: 14.9 10*3/uL — ABNORMAL HIGH (ref 4.0–10.5)
nRBC: 0 % (ref 0.0–0.2)

## 2022-02-02 LAB — PROTIME-INR
INR: 1.1 (ref 0.8–1.2)
Prothrombin Time: 13.8 seconds (ref 11.4–15.2)

## 2022-02-02 LAB — LIPASE, BLOOD: Lipase: 37 U/L (ref 11–51)

## 2022-02-02 LAB — MAGNESIUM: Magnesium: 0.9 mg/dL — CL (ref 1.7–2.4)

## 2022-02-02 SURGERY — CHOLECYSTECTOMY, ROBOT-ASSISTED, LAPAROSCOPIC
Anesthesia: General | Site: Abdomen

## 2022-02-02 MED ORDER — CHLORHEXIDINE GLUCONATE 0.12 % MT SOLN
OROMUCOSAL | Status: AC
  Administered 2022-02-02: 15 mL via OROMUCOSAL
  Filled 2022-02-02: qty 15

## 2022-02-02 MED ORDER — OXYCODONE HCL 5 MG PO TABS
5.0000 mg | ORAL_TABLET | Freq: Once | ORAL | Status: AC | PRN
  Administered 2022-02-02: 5 mg via ORAL

## 2022-02-02 MED ORDER — ONDANSETRON HCL 4 MG PO TABS: 4.0000 mg | ORAL_TABLET | Freq: Four times a day (QID) | ORAL | Status: DC | PRN

## 2022-02-02 MED ORDER — MAGNESIUM SULFATE 4 GM/100ML IV SOLN
4.0000 g | Freq: Once | INTRAVENOUS | Status: AC
  Administered 2022-02-02: 4 g via INTRAVENOUS
  Filled 2022-02-02: qty 100

## 2022-02-02 MED ORDER — INDOCYANINE GREEN 25 MG IV SOLR
2.5000 mg | Freq: Once | INTRAVENOUS | Status: AC
  Administered 2022-02-02: 2.5 mg via INTRAVENOUS
  Filled 2022-02-02: qty 1

## 2022-02-02 MED ORDER — BUPIVACAINE LIPOSOME 1.3 % IJ SUSP: 20.0000 mL | Freq: Once | INTRAMUSCULAR | Status: DC

## 2022-02-02 MED ORDER — POTASSIUM CHLORIDE IN NACL 40-0.9 MEQ/L-% IV SOLN
INTRAVENOUS | Status: DC
  Administered 2022-02-03: 100 mL/h via INTRAVENOUS
  Filled 2022-02-02 (×4): qty 1000

## 2022-02-02 MED ORDER — SODIUM CHLORIDE 0.9 % IV BOLUS
1000.0000 mL | Freq: Once | INTRAVENOUS | Status: AC
  Administered 2022-02-02: 1000 mL via INTRAVENOUS

## 2022-02-02 MED ORDER — CEFAZOLIN SODIUM-DEXTROSE 2-4 GM/100ML-% IV SOLN
INTRAVENOUS | Status: AC
  Filled 2022-02-02: qty 100

## 2022-02-02 MED ORDER — LIDOCAINE HCL (CARDIAC) PF 100 MG/5ML IV SOSY
PREFILLED_SYRINGE | INTRAVENOUS | Status: DC | PRN
  Administered 2022-02-02: 80 mg via INTRAVENOUS

## 2022-02-02 MED ORDER — FENTANYL CITRATE (PF) 100 MCG/2ML IJ SOLN
25.0000 ug | INTRAMUSCULAR | Status: DC | PRN
  Administered 2022-02-02: 50 ug via INTRAVENOUS
  Administered 2022-02-02: 25 ug via INTRAVENOUS

## 2022-02-02 MED ORDER — KETOROLAC TROMETHAMINE 30 MG/ML IJ SOLN
30.0000 mg | Freq: Four times a day (QID) | INTRAMUSCULAR | Status: DC
  Administered 2022-02-02 – 2022-02-03 (×3): 30 mg via INTRAVENOUS
  Filled 2022-02-02 (×3): qty 1

## 2022-02-02 MED ORDER — HYDROMORPHONE HCL 1 MG/ML IJ SOLN: 0.5000 mg | INTRAMUSCULAR | Status: DC | PRN

## 2022-02-02 MED ORDER — LABETALOL HCL 5 MG/ML IV SOLN
INTRAVENOUS | Status: DC | PRN
  Administered 2022-02-02: 10 mg via INTRAVENOUS

## 2022-02-02 MED ORDER — MIDAZOLAM HCL 2 MG/2ML IJ SOLN
INTRAMUSCULAR | Status: DC | PRN
  Administered 2022-02-02: 2 mg via INTRAVENOUS

## 2022-02-02 MED ORDER — ACETAMINOPHEN 500 MG PO TABS: 1000.0000 mg | ORAL_TABLET | ORAL | Status: AC

## 2022-02-02 MED ORDER — PANTOPRAZOLE SODIUM 40 MG IV SOLR
40.0000 mg | INTRAVENOUS | Status: DC
  Administered 2022-02-02: 40 mg via INTRAVENOUS
  Filled 2022-02-02: qty 10

## 2022-02-02 MED ORDER — ONDANSETRON HCL 4 MG/2ML IJ SOLN
INTRAMUSCULAR | Status: DC | PRN
  Administered 2022-02-02: 4 mg via INTRAVENOUS

## 2022-02-02 MED ORDER — CELECOXIB 200 MG PO CAPS
ORAL_CAPSULE | ORAL | Status: AC
  Administered 2022-02-02: 200 mg via ORAL
  Filled 2022-02-02: qty 1

## 2022-02-02 MED ORDER — ORAL CARE MOUTH RINSE: 15.0000 mL | Freq: Once | OROMUCOSAL | Status: AC

## 2022-02-02 MED ORDER — MIDAZOLAM HCL 2 MG/2ML IJ SOLN
INTRAMUSCULAR | Status: AC
  Filled 2022-02-02: qty 2

## 2022-02-02 MED ORDER — SUCCINYLCHOLINE CHLORIDE 200 MG/10ML IV SOSY
PREFILLED_SYRINGE | INTRAVENOUS | Status: DC | PRN
  Administered 2022-02-02: 80 mg via INTRAVENOUS

## 2022-02-02 MED ORDER — DEXAMETHASONE SODIUM PHOSPHATE 10 MG/ML IJ SOLN
INTRAMUSCULAR | Status: AC
  Filled 2022-02-02: qty 1

## 2022-02-02 MED ORDER — ACETAMINOPHEN 500 MG PO TABS
ORAL_TABLET | ORAL | Status: AC
  Administered 2022-02-02: 1000 mg via ORAL
  Filled 2022-02-02: qty 2

## 2022-02-02 MED ORDER — SUGAMMADEX SODIUM 500 MG/5ML IV SOLN
INTRAVENOUS | Status: DC | PRN
  Administered 2022-02-02: 200 mg via INTRAVENOUS

## 2022-02-02 MED ORDER — BUPIVACAINE HCL (PF) 0.5 % IJ SOLN
INTRAMUSCULAR | Status: DC | PRN
  Administered 2022-02-02: 20 mL

## 2022-02-02 MED ORDER — LIDOCAINE HCL (PF) 2 % IJ SOLN
INTRAMUSCULAR | Status: AC
  Filled 2022-02-02: qty 5

## 2022-02-02 MED ORDER — FENTANYL CITRATE (PF) 100 MCG/2ML IJ SOLN
INTRAMUSCULAR | Status: AC
  Filled 2022-02-02: qty 2

## 2022-02-02 MED ORDER — SERTRALINE HCL 50 MG PO TABS
100.0000 mg | ORAL_TABLET | Freq: Every day | ORAL | Status: DC
  Administered 2022-02-03: 100 mg via ORAL
  Filled 2022-02-02: qty 2

## 2022-02-02 MED ORDER — HYDRALAZINE HCL 20 MG/ML IJ SOLN: 10.0000 mg | Freq: Four times a day (QID) | INTRAMUSCULAR | Status: DC | PRN

## 2022-02-02 MED ORDER — HYDROMORPHONE HCL 1 MG/ML IJ SOLN
0.5000 mg | Freq: Once | INTRAMUSCULAR | Status: AC
  Administered 2022-02-02: 0.5 mg via INTRAVENOUS
  Filled 2022-02-02: qty 0.5

## 2022-02-02 MED ORDER — ALBUTEROL SULFATE (2.5 MG/3ML) 0.083% IN NEBU: 2.5000 mg | INHALATION_SOLUTION | Freq: Four times a day (QID) | RESPIRATORY_TRACT | Status: DC | PRN

## 2022-02-02 MED ORDER — GABAPENTIN 300 MG PO CAPS
ORAL_CAPSULE | ORAL | Status: AC
  Administered 2022-02-02: 300 mg via ORAL
  Filled 2022-02-02: qty 1

## 2022-02-02 MED ORDER — INDOCYANINE GREEN 25 MG IV SOLR
1.2500 mg | Freq: Once | INTRAVENOUS | Status: DC
  Filled 2022-02-02: qty 10

## 2022-02-02 MED ORDER — OXYCODONE HCL 5 MG PO TABS
ORAL_TABLET | ORAL | Status: AC
  Filled 2022-02-02: qty 1

## 2022-02-02 MED ORDER — CHLORHEXIDINE GLUCONATE 0.12 % MT SOLN: 15.0000 mL | Freq: Once | OROMUCOSAL | Status: AC

## 2022-02-02 MED ORDER — PHENYLEPHRINE HCL (PRESSORS) 10 MG/ML IV SOLN
INTRAVENOUS | Status: DC | PRN
  Administered 2022-02-02 (×2): 160 ug via INTRAVENOUS
  Administered 2022-02-02 (×2): 80 ug via INTRAVENOUS
  Administered 2022-02-02: 160 ug via INTRAVENOUS

## 2022-02-02 MED ORDER — DICYCLOMINE HCL 10 MG PO CAPS
10.0000 mg | ORAL_CAPSULE | Freq: Three times a day (TID) | ORAL | Status: DC | PRN
  Filled 2022-02-02: qty 1

## 2022-02-02 MED ORDER — 0.9 % SODIUM CHLORIDE (POUR BTL) OPTIME
TOPICAL | Status: DC | PRN
  Administered 2022-02-02: 500 mL

## 2022-02-02 MED ORDER — ROCURONIUM BROMIDE 100 MG/10ML IV SOLN
INTRAVENOUS | Status: DC | PRN
  Administered 2022-02-02: 30 mg via INTRAVENOUS

## 2022-02-02 MED ORDER — OXYCODONE HCL 5 MG PO TABS
5.0000 mg | ORAL_TABLET | ORAL | Status: DC | PRN
  Administered 2022-02-02 – 2022-02-03 (×2): 10 mg via ORAL
  Filled 2022-02-02 (×2): qty 2

## 2022-02-02 MED ORDER — DEXAMETHASONE SODIUM PHOSPHATE 10 MG/ML IJ SOLN
INTRAMUSCULAR | Status: DC | PRN
  Administered 2022-02-02: 5 mg via INTRAVENOUS

## 2022-02-02 MED ORDER — ONDANSETRON HCL 4 MG/2ML IJ SOLN: 4.0000 mg | Freq: Four times a day (QID) | INTRAMUSCULAR | Status: DC | PRN

## 2022-02-02 MED ORDER — GABAPENTIN 300 MG PO CAPS: 300.0000 mg | ORAL_CAPSULE | ORAL | Status: AC

## 2022-02-02 MED ORDER — CEFAZOLIN SODIUM-DEXTROSE 2-4 GM/100ML-% IV SOLN
2.0000 g | INTRAVENOUS | Status: AC
  Administered 2022-02-02: 2 g via INTRAVENOUS

## 2022-02-02 MED ORDER — ONDANSETRON HCL 4 MG/2ML IJ SOLN: 4.0000 mg | Freq: Once | INTRAMUSCULAR | Status: DC | PRN

## 2022-02-02 MED ORDER — OXYCODONE HCL 5 MG/5ML PO SOLN: 5.0000 mg | Freq: Once | ORAL | Status: AC | PRN

## 2022-02-02 MED ORDER — ALBUTEROL SULFATE HFA 108 (90 BASE) MCG/ACT IN AERS
INHALATION_SPRAY | RESPIRATORY_TRACT | Status: AC
  Filled 2022-02-02: qty 13.4

## 2022-02-02 MED ORDER — FENTANYL CITRATE (PF) 100 MCG/2ML IJ SOLN
INTRAMUSCULAR | Status: AC
  Administered 2022-02-02: 25 ug via INTRAVENOUS
  Filled 2022-02-02: qty 2

## 2022-02-02 MED ORDER — ENOXAPARIN SODIUM 40 MG/0.4ML IJ SOSY
40.0000 mg | PREFILLED_SYRINGE | INTRAMUSCULAR | Status: DC
  Administered 2022-02-02: 40 mg via SUBCUTANEOUS
  Filled 2022-02-02: qty 0.4

## 2022-02-02 MED ORDER — CEFAZOLIN SODIUM-DEXTROSE 2-4 GM/100ML-% IV SOLN
2.0000 g | Freq: Once | INTRAVENOUS | Status: AC
  Administered 2022-02-02: 2 g via INTRAVENOUS
  Filled 2022-02-02: qty 100

## 2022-02-02 MED ORDER — METOCLOPRAMIDE HCL 5 MG/ML IJ SOLN
10.0000 mg | Freq: Three times a day (TID) | INTRAMUSCULAR | Status: AC
  Administered 2022-02-02 – 2022-02-03 (×3): 10 mg via INTRAVENOUS
  Filled 2022-02-02 (×3): qty 2

## 2022-02-02 MED ORDER — CHLORHEXIDINE GLUCONATE CLOTH 2 % EX PADS
6.0000 | MEDICATED_PAD | Freq: Once | CUTANEOUS | Status: AC
  Administered 2022-02-02: 6 via TOPICAL

## 2022-02-02 MED ORDER — LISINOPRIL 10 MG PO TABS
20.0000 mg | ORAL_TABLET | Freq: Once | ORAL | Status: AC
  Administered 2022-02-02: 20 mg via ORAL
  Filled 2022-02-02: qty 2

## 2022-02-02 MED ORDER — ALBUTEROL SULFATE HFA 108 (90 BASE) MCG/ACT IN AERS
INHALATION_SPRAY | RESPIRATORY_TRACT | Status: DC | PRN
  Administered 2022-02-02 (×2): 3 via RESPIRATORY_TRACT

## 2022-02-02 MED ORDER — PROPOFOL 10 MG/ML IV BOLUS
INTRAVENOUS | Status: DC | PRN
  Administered 2022-02-02: 130 mg via INTRAVENOUS

## 2022-02-02 MED ORDER — CELECOXIB 200 MG PO CAPS: 200.0000 mg | ORAL_CAPSULE | ORAL | Status: AC

## 2022-02-02 MED ORDER — METOCLOPRAMIDE HCL 5 MG/ML IJ SOLN
10.0000 mg | Freq: Once | INTRAMUSCULAR | Status: AC
  Administered 2022-02-02: 10 mg via INTRAVENOUS
  Filled 2022-02-02: qty 2

## 2022-02-02 MED ORDER — CHLORHEXIDINE GLUCONATE CLOTH 2 % EX PADS: 6.0000 | MEDICATED_PAD | Freq: Once | CUTANEOUS | Status: DC

## 2022-02-02 MED ORDER — LACTATED RINGERS IV SOLN: INTRAVENOUS | Status: DC

## 2022-02-02 MED ORDER — HYDROCHLOROTHIAZIDE 25 MG PO TABS
25.0000 mg | ORAL_TABLET | Freq: Once | ORAL | Status: AC
  Administered 2022-02-02: 25 mg via ORAL
  Filled 2022-02-02: qty 1

## 2022-02-02 MED ORDER — ONDANSETRON HCL 4 MG/2ML IJ SOLN
INTRAMUSCULAR | Status: AC
  Filled 2022-02-02: qty 2

## 2022-02-02 MED ORDER — ONDANSETRON HCL 4 MG/2ML IJ SOLN
4.0000 mg | Freq: Once | INTRAMUSCULAR | Status: AC
  Administered 2022-02-02: 4 mg via INTRAVENOUS
  Filled 2022-02-02: qty 2

## 2022-02-02 MED ORDER — FENTANYL CITRATE (PF) 100 MCG/2ML IJ SOLN
INTRAMUSCULAR | Status: DC | PRN
  Administered 2022-02-02: 100 ug via INTRAVENOUS

## 2022-02-02 MED ORDER — BUPROPION HCL 75 MG PO TABS
75.0000 mg | ORAL_TABLET | Freq: Three times a day (TID) | ORAL | Status: DC
  Administered 2022-02-03: 75 mg via ORAL
  Filled 2022-02-02 (×2): qty 1

## 2022-02-02 MED ORDER — PROPOFOL 10 MG/ML IV BOLUS
INTRAVENOUS | Status: AC
  Filled 2022-02-02: qty 20

## 2022-02-02 MED ORDER — POTASSIUM CHLORIDE 10 MEQ/100ML IV SOLN
10.0000 meq | INTRAVENOUS | Status: AC
  Administered 2022-02-02 – 2022-02-03 (×6): 10 meq via INTRAVENOUS
  Filled 2022-02-02 (×5): qty 100

## 2022-02-02 SURGICAL SUPPLY — 45 items
BAG PRESSURE INF REUSE 3000 (BAG) IMPLANT
CLIP LIGATING HEM O LOK PURPLE (MISCELLANEOUS) ×1 IMPLANT
COVER TIP SHEARS 8 DVNC (MISCELLANEOUS) ×1 IMPLANT
COVER TIP SHEARS 8MM DA VINCI (MISCELLANEOUS) ×1
DERMABOND ADVANCED .7 DNX12 (GAUZE/BANDAGES/DRESSINGS) ×1 IMPLANT
DRAPE ARM DVNC X/XI (DISPOSABLE) ×4 IMPLANT
DRAPE COLUMN DVNC XI (DISPOSABLE) ×1 IMPLANT
DRAPE DA VINCI XI ARM (DISPOSABLE) ×4
DRAPE DA VINCI XI COLUMN (DISPOSABLE) ×1
ELECT CAUTERY BLADE 6.4 (BLADE) ×1 IMPLANT
GLOVE ORTHO TXT STRL SZ7.5 (GLOVE) ×2 IMPLANT
GOWN STRL REUS W/ TWL LRG LVL3 (GOWN DISPOSABLE) ×2 IMPLANT
GOWN STRL REUS W/ TWL XL LVL3 (GOWN DISPOSABLE) ×2 IMPLANT
GOWN STRL REUS W/TWL LRG LVL3 (GOWN DISPOSABLE) ×2
GOWN STRL REUS W/TWL XL LVL3 (GOWN DISPOSABLE) ×2
GRASPER SUT TROCAR 14GX15 (MISCELLANEOUS) IMPLANT
IRRIGATION STRYKERFLOW (MISCELLANEOUS) IMPLANT
IRRIGATOR STRYKERFLOW (MISCELLANEOUS)
IRRIGATOR SUCT 8 DISP DVNC XI (IRRIGATION / IRRIGATOR) IMPLANT
IRRIGATOR SUCTION 8MM XI DISP (IRRIGATION / IRRIGATOR)
IV NS IRRIG 3000ML ARTHROMATIC (IV SOLUTION) IMPLANT
KIT PINK PAD W/HEAD ARE REST (MISCELLANEOUS) ×1 IMPLANT
KIT PINK PAD W/HEAD ARM REST (MISCELLANEOUS) ×1 IMPLANT
KIT TURNOVER KIT A (KITS) ×1 IMPLANT
LABEL OR SOLS (LABEL) ×1 IMPLANT
MANIFOLD NEPTUNE II (INSTRUMENTS) ×1 IMPLANT
NDL INSUFFLATION 14GA 120MM (NEEDLE) IMPLANT
NEEDLE HYPO 22GX1.5 SAFETY (NEEDLE) ×1 IMPLANT
NEEDLE INSUFFLATION 14GA 120MM (NEEDLE) IMPLANT
NS IRRIG 500ML POUR BTL (IV SOLUTION) ×1 IMPLANT
PACK LAP CHOLECYSTECTOMY (MISCELLANEOUS) ×1 IMPLANT
SEAL CANN UNIV 5-8 DVNC XI (MISCELLANEOUS) ×4 IMPLANT
SEAL XI 5MM-8MM UNIVERSAL (MISCELLANEOUS) ×4
SET TUBE SMOKE EVAC HIGH FLOW (TUBING) ×1 IMPLANT
SOLUTION ELECTROLUBE (MISCELLANEOUS) ×1 IMPLANT
SPIKE FLUID TRANSFER (MISCELLANEOUS) ×1 IMPLANT
SUT MNCRL 4-0 (SUTURE) ×1
SUT MNCRL 4-0 27XMFL (SUTURE) ×1
SUT VICRYL 0 UR6 27IN ABS (SUTURE) ×1 IMPLANT
SUTURE MNCRL 4-0 27XMF (SUTURE) ×1 IMPLANT
SYS BAG RETRIEVAL 10MM (BASKET) ×1
SYSTEM BAG RETRIEVAL 10MM (BASKET) ×1 IMPLANT
TRAP FLUID SMOKE EVACUATOR (MISCELLANEOUS) ×1 IMPLANT
TROCAR Z-THREAD FIOS 11X100 BL (TROCAR) IMPLANT
WATER STERILE IRR 500ML POUR (IV SOLUTION) ×1 IMPLANT

## 2022-02-02 NOTE — Assessment & Plan Note (Signed)
Patient has been unable to take her antihypertensive medications due to persistent nausea and vomiting. Place patient on IV hydralazine for systolic blood pressure greater than 152mHg

## 2022-02-02 NOTE — Transfer of Care (Signed)
Immediate Anesthesia Transfer of Care Note  Patient: Shirley Evans  Procedure(s) Performed: XI ROBOTIC ASSISTED LAPAROSCOPIC CHOLECYSTECTOMY (Abdomen) INDOCYANINE GREEN FLUORESCENCE IMAGING (ICG) (Abdomen)  Patient Location: PACU  Anesthesia Type:General  Level of Consciousness: drowsy and patient cooperative  Airway & Oxygen Therapy: Patient Spontanous Breathing and Patient connected to face mask oxygen  Post-op Assessment: Report given to RN and Post -op Vital signs reviewed and stable  Post vital signs: Reviewed and stable  Last Vitals:  Vitals Value Taken Time  BP 120/80 02/02/22 1744  Temp    Pulse 62 02/02/22 1746  Resp 23 02/02/22 1746  SpO2 98 % 02/02/22 1746  Vitals shown include unvalidated device data.  Last Pain:  Vitals:   02/02/22 1558  TempSrc:   PainSc: 8          Complications: No notable events documented.

## 2022-02-02 NOTE — ED Notes (Signed)
Pt leaves department with all belongings and signed consent form.

## 2022-02-02 NOTE — Assessment & Plan Note (Signed)
Continue sertraline 

## 2022-02-02 NOTE — H&P (View-Only) (Signed)
Chief Complaint: Nausea/vomiting.   History of Present Illness Shirley Evans is a 56 y.o. female with an MRCP for abnormal LFTs about a month ago.  Gets sick rather frequently, seems to think it is more associated with tomato and spicy foods anything else.  When asked how to help her liver get better, she indicates she could quit drinking as much.  Otherwise poor historian.  She now presents to the ED after an office visit yesterday, indicating she cannot keep anything down.  Intractable nausea and vomiting, nonbilious, no history of melena or hematemesis..   Past Medical History     Past Medical History:  Diagnosis Date   Cancer ([place])      cervical   COPD (chronic obstructive pulmonary disease) (HCC)     GERD (gastroesophageal reflux disease)     History of Helicobacter pylori infection     Hypertension     Menopausal state     Osteopenia     Postmenopausal atrophic vaginitis               Past Surgical History:  Procedure Laterality Date   BREAST BIOPSY Bilateral      neg   TOTAL HIP ARTHROPLASTY Left 12/22/2020    Procedure: TOTAL HIP ARTHROPLASTY ANTERIOR APPROACH;  Surgeon: Hessie Knows, MD;  Location: [facility] ORS;  Service: Orthopedics;  Laterality: Left;          Allergies  Allergen Reactions   Demerol [Meperidine] Nausea Only and Nausea And Vomiting   Codeine Nausea Only            Current Outpatient Medications  Medication Sig Dispense Refill   albuterol (VENTOLIN HFA) 108 (90 Base) MCG/ACT inhaler Inhale into the lungs.       cholecalciferol (VITAMIN D3) 25 MCG (1000 UNIT) tablet Take 1,000 Units by mouth daily.       cyclobenzaprine (FLEXERIL) 10 MG tablet Take 10 mg by mouth daily.       dicyclomine (BENTYL) 10 MG capsule Take 1 capsule (10 mg total) by mouth 3 (three) times daily as needed (abd pain). 15 capsule 0   lidocaine (LIDODERM) 5 % Place onto the skin.       lisinopril-hydrochlorothiazide (ZESTORETIC) 20-12.5 MG tablet Take 1 tablet by mouth daily.        loperamide (IMODIUM) 2 MG capsule Take 1 capsule (2 mg total) by mouth as needed for diarrhea or loose stools. 30 capsule 0   magnesium gluconate (MAGONATE) 500 MG tablet Take 1 tablet (500 mg total) by mouth 2 (two) times daily. 60 tablet 1   omeprazole (PRILOSEC) 20 MG capsule Take 1 capsule (20 mg total) by mouth 2 (two) times daily before a meal. 60 capsule 11   ondansetron (ZOFRAN-ODT) 4 MG disintegrating tablet Take 1 tablet (4 mg total) by mouth every 8 (eight) hours as needed for nausea or vomiting. 20 tablet 0   oxyCODONE-acetaminophen (PERCOCET) 5-325 MG tablet Take 1 tablet by mouth every 8 (eight) hours as needed for severe pain. 15 tablet 0   oxyCODONE-acetaminophen (PERCOCET) 5-325 MG tablet Take 1 tablet by mouth every 4 (four) hours as needed for severe pain. 10 tablet 0   sertraline (ZOLOFT) 100 MG tablet Take 100 mg by mouth daily.       valACYclovir (VALTREX) 1000 MG tablet Take 1,000 mg by mouth daily.       albuterol (PROVENTIL) (2.5 MG/3ML) 0.083% nebulizer solution Inhale into the lungs.       buPROPion (WELLBUTRIN) 75  MG tablet Take 75 mg by mouth 3 (three) times daily.       rosuvastatin (CRESTOR) 40 MG tablet Take 40 mg by mouth daily.        No current facility-administered medications for this visit.      Family History      Family History  Problem Relation Age of Onset   Diabetes Mother     Hypertension Mother     Breast cancer Paternal Aunt 37   Hypertension Father          Social History Social History         Tobacco Use   Smoking status: Former      Packs/day: 1.00      Years: 30.00      Total pack years: 30.00      Types: Cigarettes   Smokeless tobacco: Never  Vaping Use   Vaping Use: Never used  Substance Use Topics   Alcohol use: Not Currently      Alcohol/week: 8.0 standard drinks of alcohol      Types: 8 Cans of beer per week   Drug use: Yes      Types: Marijuana      Comment: CBD  gummy, prescribed oxy          Review of  Systems  Constitutional:  Positive for chills, fever, malaise/fatigue and weight loss.  HENT: Negative.    Eyes: Negative.   Respiratory:  Positive for shortness of breath and wheezing.   Cardiovascular: Negative.   Gastrointestinal:  Positive for abdominal pain, diarrhea, heartburn, nausea and vomiting.  Genitourinary: Negative.   Skin: Negative.   Neurological:  Positive for headaches.  Psychiatric/Behavioral:  Positive for depression.         Physical Exam Blood pressure (!) 190/151, pulse (!) 121, temperature 97.8 F (36.6 C), temperature source Oral, height '5\' 3"'$  (1.6 m), weight 139 lb 12.8 oz (63.4 kg), SpO2 98 %. Last Weight  Most recent update: 02/01/2022  1:33 PM      Weight  63.4 kg (139 lb 12.8 oz)                     CONSTITUTIONAL: Well developed, and nourished, appropriately responsive and aware without distress.   EYES: Sclera non-icteric.   EARS, NOSE, MOUTH AND THROAT:  The oropharynx is clear. Oral mucosa is pink and moist.    Hearing is intact to voice.  NECK: Trachea is midline, and there is no jugular venous distension.  LYMPH NODES:  Lymph nodes in the neck are not appreciated. RESPIRATORY:  Lungs are clear, and breath sounds are equal bilaterally. Normal respiratory effort without pathologic use of accessory muscles. CARDIOVASCULAR: Heart is regular in rate and rhythm.  Well perfused.  GI: The abdomen is  soft, nontender, and nondistended. There were no palpable masses. I did not appreciate hepatosplenomegaly. There were normal bowel sounds. MUSCULOSKELETAL:  Symmetrical muscle tone appreciated in all four extremities.    SKIN: Skin turgor is normal. No pathologic skin lesions appreciated.  NEUROLOGIC:  Motor and sensation appear grossly normal.  Cranial nerves are grossly without defect. PSYCH:  Alert and oriented to person, place and time. Affect is appropriate for situation.   Data Reviewed I have personally reviewed what is currently available of the  patient's imaging, recent labs and medical records.   Labs:      Latest Ref Rng & Units 01/03/2022    9:25 AM 12/12/2021   10:23 AM 08/31/2021  4:22 AM  CBC  WBC 4.0 - 10.5 K/uL 13.5  11.3  9.0   Hemoglobin 12.0 - 15.0 g/dL 16.8  14.7  14.5   Hematocrit 36.0 - 46.0 % 47.6  41.8  43.5   Platelets 150 - 400 K/uL 343  277  208         Latest Ref Rng & Units 01/03/2022    9:25 AM 12/12/2021   10:23 AM 09/01/2021    4:16 AM  CMP  Glucose 70 - 99 mg/dL 236  113  119   BUN 6 - 20 mg/dL '19  19  18   '$ Creatinine 0.44 - 1.00 mg/dL 1.72  0.64  1.05   Sodium 135 - 145 mmol/L 135  135  135   Potassium 3.5 - 5.1 mmol/L 3.9  3.6  4.3   Chloride 98 - 111 mmol/L 94  100  98   CO2 22 - 32 mmol/L '21  17  30   '$ Calcium 8.9 - 10.3 mg/dL 10.6  10.1  9.9   Total Protein 6.5 - 8.1 g/dL 8.6  7.8     Total Bilirubin 0.3 - 1.2 mg/dL 3.5  2.7     Alkaline Phos 38 - 126 U/L 130  134     AST 15 - 41 U/L 118  357     ALT 0 - 44 U/L 46  176           Imaging: Radiological images reviewed:   CLINICAL DATA:  Abdominal pain.  Elevated LFTs.   EXAM: ULTRASOUND ABDOMEN LIMITED RIGHT UPPER QUADRANT   COMPARISON:  MRI 01/03/2022.  Ultrasound 01/03/2022   FINDINGS: Gallbladder:   Sludge and small stones are noted in the lumen of the gallbladder. No gallbladder wall thickening or pericholecystic fluid. No sonographic Murphy sign.   Common bile duct:   Diameter: 2 mm   Liver:   No focal lesion identified. Within normal limits in parenchymal echogenicity. Portal vein is patent on color Doppler imaging with normal direction of blood flow towards the liver.   Other: None.   IMPRESSION: Sludge and small stones in the lumen of the gallbladder without biliary dilatation. No sonographic findings to suggest acute cholecystitis.     Electronically Signed   By: Misty Stanley M.D.   On: 02/02/2022 10:02  CLINICAL DATA:  Elevated liver function studies.  Cholelithiasis.   EXAM: MRI ABDOMEN WITHOUT  AND WITH CONTRAST (INCLUDING MRCP)   TECHNIQUE: Multiplanar multisequence MR imaging of the abdomen was performed both before and after the administration of intravenous contrast. Heavily T2-weighted images of the biliary and pancreatic ducts were obtained, and three-dimensional MRCP images were rendered by post processing.   CONTRAST:  12m GADAVIST GADOBUTROL 1 MMOL/ML IV SOLN   COMPARISON:  CT and ultrasound examinations, same date.   FINDINGS: Lower chest: The lung bases are clear of an acute process. No pleural or pericardial effusion.   Hepatobiliary: Diffuse and fairly marked fatty infiltration of the liver but no hepatic lesions or intrahepatic biliary dilatation. Several small layering gallstones are noted but no gallbladder distension, gallbladder thickening, mucosal enhancement and more pericholecystic inflammatory changes to suggest acute cholecystitis. Normal caliber and course of the common bile duct. No common bile duct stones are identified.   Pancreas:  No mass, inflammation or ductal dilatation.   Spleen:  Normal size.  No focal lesions.   Adrenals/Urinary Tract: Adrenal glands and kidneys are unremarkable. No worrisome renal lesions or hydronephrosis.   Stomach/Bowel: The stomach,  duodenum, visualized small bowel visualized colon are unremarkable   Vascular/Lymphatic: The aorta and branch vessels are patent. The major venous structures are patent. No mesenteric or retroperitoneal mass or adenopathy.   Other:  No ascites or abdominal wall hernia.   Musculoskeletal: No significant bony findings. Remote lower thoracic compression fractures and advanced degenerative disc disease at L5-S1.   IMPRESSION: 1. Diffuse and fairly marked fatty infiltration of the liver but no hepatic lesions or intrahepatic biliary dilatation. 2. Several small layering gallstones in the gallbladder but no MR findings for acute cholecystitis. 3. Normal caliber and course of the  common bile duct. No common bile duct stones. 4. Remote lower thoracic compression fractures and advanced degenerative disc disease at L5-S1.     Electronically Signed   By: Marijo Sanes M.D.   On: 01/03/2022 13:39 Within last 24 hrs: No results found.   Assessment Intracable n/v,  Chronic calculus cholecystitis with abnormal LFTs.        Patient Active Problem List    Diagnosis Date Noted   Chronic diarrhea 08/28/2021   Thrombocytopenia ([place]) 08/27/2021   Hypophosphatemia 08/27/2021   AKI (acute kidney injury) ([place]) 08/25/2021   Vertebral fracture, osteoporotic, initial encounter ([place]) 08/25/2021   Acute gastroenteritis 08/25/2021   Depression 08/25/2021   Sepsis ([place]) 08/25/2021   Overweight (BMI 25.0-29.9) 08/11/2021   Intractable nausea and vomiting 08/10/2021   Hypertensive urgency 08/10/2021   Dyslipidemia 08/10/2021   GERD (gastroesophageal reflux disease) 08/10/2021   Alcohol abuse 08/10/2021   Elevated LFTs 08/10/2021   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   This was discussed thoroughly.  Optimal plan is for robotic cholecystectomy utilizing ICG imaging. Risks and benefits have been discussed with the patient which include but are not limited to anesthesia, bleeding, infection, biliary ductal injury, resulting in leak or stenosis, other associated unanticipated injuries affiliated with laparoscopic surgery.   Reviewed that removing the gallbladder will only address the symptoms related to the gallbladder itself.  I believe there is the desire to proceed, accepting the risks with understanding.  Questions elicited and  answered to satisfaction.    No guarantees ever expressed or implied.     Face-to-face time spent with the patient and accompanying care providers(if present) was 40 minutes, with more than 50% of the time spent counseling, educating, and coordinating care of the patient.     These notes generated with voice recognition software. I apologize for typographical errors.

## 2022-02-02 NOTE — Assessment & Plan Note (Signed)
Pre renal secondary to GI losses from nausea, vomiting and diarrhea Hydrate patient Repeat renal parameters in a.m.

## 2022-02-02 NOTE — Assessment & Plan Note (Addendum)
Patient presents for evaluation of refractory nausea and vomiting and has had several episodes in the past. She has transaminitis and had a recent MRCP which showed diffuse and fairly marked fatty infiltration of the liver but no hepatic lesions or intrahepatic biliary dilatation. Several small layering gallstones in the gallbladder but no MR findings for acute cholecystitis. Normal caliber and course of the common bile duct. No common bile duct stones. Supportive care with IV fluid hydration, antiemetics and IV PPI We will consult surgery to evaluate patient for possible gallbladder disease

## 2022-02-02 NOTE — Anesthesia Postprocedure Evaluation (Signed)
Anesthesia Post Note  Patient: Shirley Evans  Procedure(s) Performed: XI ROBOTIC ASSISTED LAPAROSCOPIC CHOLECYSTECTOMY (Abdomen) INDOCYANINE GREEN FLUORESCENCE IMAGING (ICG) (Abdomen)  Patient location during evaluation: PACU Anesthesia Type: General Level of consciousness: awake and alert Pain management: pain level controlled Vital Signs Assessment: post-procedure vital signs reviewed and stable Respiratory status: spontaneous breathing, nonlabored ventilation, respiratory function stable and patient connected to nasal cannula oxygen Cardiovascular status: blood pressure returned to baseline and stable Postop Assessment: no apparent nausea or vomiting Anesthetic complications: no   No notable events documented.   Last Vitals:  Vitals:   02/02/22 1830 02/02/22 1835  BP: 98/70   Pulse: 69 79  Resp: 12 17  Temp:    SpO2: 96% 93%    Last Pain:  Vitals:   02/02/22 1835  TempSrc:   PainSc: 4                  Arita Miss

## 2022-02-02 NOTE — ED Triage Notes (Signed)
Pt here with emesis and diarrhea since Sun. Pt also having fevers at home. Pt also c/o generalized weakness.

## 2022-02-02 NOTE — ED Notes (Signed)
Pt remains with elevated BP - notified provider Dr. Harvest Dark
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
## 2022-02-02 NOTE — ED Notes (Addendum)
[place] at bedside - pt reports she vomited x 1, nausea resolved at this time.  BP remains high. Provider updated on current BP

## 2022-02-02 NOTE — Consult Note (Signed)
Chief Complaint: Nausea/vomiting.   History of Present Illness Shirley Evans is a 56 y.o. female with an MRCP for abnormal LFTs about a month ago.  Gets sick rather frequently, seems to think it is more associated with tomato and spicy foods anything else.  When asked how to help her liver get better, she indicates she could quit drinking as much.  Otherwise poor historian.  She now presents to the ED after an office visit yesterday, indicating she cannot keep anything down.  Intractable nausea and vomiting, nonbilious, no history of melena or hematemesis..   Past Medical History     Past Medical History:  Diagnosis Date   Cancer ([place])      cervical   COPD (chronic obstructive pulmonary disease) (HCC)     GERD (gastroesophageal reflux disease)     History of Helicobacter pylori infection     Hypertension     Menopausal state     Osteopenia     Postmenopausal atrophic vaginitis               Past Surgical History:  Procedure Laterality Date   BREAST BIOPSY Bilateral      neg   TOTAL HIP ARTHROPLASTY Left 12/22/2020    Procedure: TOTAL HIP ARTHROPLASTY ANTERIOR APPROACH;  Surgeon: Hessie Knows, MD;  Location: [facility] ORS;  Service: Orthopedics;  Laterality: Left;          Allergies  Allergen Reactions   Demerol [Meperidine] Nausea Only and Nausea And Vomiting   Codeine Nausea Only            Current Outpatient Medications  Medication Sig Dispense Refill   albuterol (VENTOLIN HFA) 108 (90 Base) MCG/ACT inhaler Inhale into the lungs.       cholecalciferol (VITAMIN D3) 25 MCG (1000 UNIT) tablet Take 1,000 Units by mouth daily.       cyclobenzaprine (FLEXERIL) 10 MG tablet Take 10 mg by mouth daily.       dicyclomine (BENTYL) 10 MG capsule Take 1 capsule (10 mg total) by mouth 3 (three) times daily as needed (abd pain). 15 capsule 0   lidocaine (LIDODERM) 5 % Place onto the skin.       lisinopril-hydrochlorothiazide (ZESTORETIC) 20-12.5 MG tablet Take 1 tablet by mouth daily.        loperamide (IMODIUM) 2 MG capsule Take 1 capsule (2 mg total) by mouth as needed for diarrhea or loose stools. 30 capsule 0   magnesium gluconate (MAGONATE) 500 MG tablet Take 1 tablet (500 mg total) by mouth 2 (two) times daily. 60 tablet 1   omeprazole (PRILOSEC) 20 MG capsule Take 1 capsule (20 mg total) by mouth 2 (two) times daily before a meal. 60 capsule 11   ondansetron (ZOFRAN-ODT) 4 MG disintegrating tablet Take 1 tablet (4 mg total) by mouth every 8 (eight) hours as needed for nausea or vomiting. 20 tablet 0   oxyCODONE-acetaminophen (PERCOCET) 5-325 MG tablet Take 1 tablet by mouth every 8 (eight) hours as needed for severe pain. 15 tablet 0   oxyCODONE-acetaminophen (PERCOCET) 5-325 MG tablet Take 1 tablet by mouth every 4 (four) hours as needed for severe pain. 10 tablet 0   sertraline (ZOLOFT) 100 MG tablet Take 100 mg by mouth daily.       valACYclovir (VALTREX) 1000 MG tablet Take 1,000 mg by mouth daily.       albuterol (PROVENTIL) (2.5 MG/3ML) 0.083% nebulizer solution Inhale into the lungs.       buPROPion (WELLBUTRIN) 75  MG tablet Take 75 mg by mouth 3 (three) times daily.       rosuvastatin (CRESTOR) 40 MG tablet Take 40 mg by mouth daily.        No current facility-administered medications for this visit.      Family History      Family History  Problem Relation Age of Onset   Diabetes Mother     Hypertension Mother     Breast cancer Paternal Aunt 64   Hypertension Father          Social History Social History         Tobacco Use   Smoking status: Former      Packs/day: 1.00      Years: 30.00      Total pack years: 30.00      Types: Cigarettes   Smokeless tobacco: Never  Vaping Use   Vaping Use: Never used  Substance Use Topics   Alcohol use: Not Currently      Alcohol/week: 8.0 standard drinks of alcohol      Types: 8 Cans of beer per week   Drug use: Yes      Types: Marijuana      Comment: CBD  gummy, prescribed oxy          Review of  Systems  Constitutional:  Positive for chills, fever, malaise/fatigue and weight loss.  HENT: Negative.    Eyes: Negative.   Respiratory:  Positive for shortness of breath and wheezing.   Cardiovascular: Negative.   Gastrointestinal:  Positive for abdominal pain, diarrhea, heartburn, nausea and vomiting.  Genitourinary: Negative.   Skin: Negative.   Neurological:  Positive for headaches.  Psychiatric/Behavioral:  Positive for depression.         Physical Exam Blood pressure (!) 190/151, pulse (!) 121, temperature 97.8 F (36.6 C), temperature source Oral, height '5\' 3"'$  (1.6 m), weight 139 lb 12.8 oz (63.4 kg), SpO2 98 %. Last Weight  Most recent update: 02/01/2022  1:33 PM      Weight  63.4 kg (139 lb 12.8 oz)                     CONSTITUTIONAL: Well developed, and nourished, appropriately responsive and aware without distress.   EYES: Sclera non-icteric.   EARS, NOSE, MOUTH AND THROAT:  The oropharynx is clear. Oral mucosa is pink and moist.    Hearing is intact to voice.  NECK: Trachea is midline, and there is no jugular venous distension.  LYMPH NODES:  Lymph nodes in the neck are not appreciated. RESPIRATORY:  Lungs are clear, and breath sounds are equal bilaterally. Normal respiratory effort without pathologic use of accessory muscles. CARDIOVASCULAR: Heart is regular in rate and rhythm.  Well perfused.  GI: The abdomen is  soft, nontender, and nondistended. There were no palpable masses. I did not appreciate hepatosplenomegaly. There were normal bowel sounds. MUSCULOSKELETAL:  Symmetrical muscle tone appreciated in all four extremities.    SKIN: Skin turgor is normal. No pathologic skin lesions appreciated.  NEUROLOGIC:  Motor and sensation appear grossly normal.  Cranial nerves are grossly without defect. PSYCH:  Alert and oriented to person, place and time. Affect is appropriate for situation.   Data Reviewed I have personally reviewed what is currently available of the  patient's imaging, recent labs and medical records.   Labs:      Latest Ref Rng & Units 01/03/2022    9:25 AM 12/12/2021   10:23 AM 08/31/2021  4:22 AM  CBC  WBC 4.0 - 10.5 K/uL 13.5  11.3  9.0   Hemoglobin 12.0 - 15.0 g/dL 16.8  14.7  14.5   Hematocrit 36.0 - 46.0 % 47.6  41.8  43.5   Platelets 150 - 400 K/uL 343  277  208         Latest Ref Rng & Units 01/03/2022    9:25 AM 12/12/2021   10:23 AM 09/01/2021    4:16 AM  CMP  Glucose 70 - 99 mg/dL 236  113  119   BUN 6 - 20 mg/dL '19  19  18   '$ Creatinine 0.44 - 1.00 mg/dL 1.72  0.64  1.05   Sodium 135 - 145 mmol/L 135  135  135   Potassium 3.5 - 5.1 mmol/L 3.9  3.6  4.3   Chloride 98 - 111 mmol/L 94  100  98   CO2 22 - 32 mmol/L '21  17  30   '$ Calcium 8.9 - 10.3 mg/dL 10.6  10.1  9.9   Total Protein 6.5 - 8.1 g/dL 8.6  7.8     Total Bilirubin 0.3 - 1.2 mg/dL 3.5  2.7     Alkaline Phos 38 - 126 U/L 130  134     AST 15 - 41 U/L 118  357     ALT 0 - 44 U/L 46  176           Imaging: Radiological images reviewed:   CLINICAL DATA:  Abdominal pain.  Elevated LFTs.   EXAM: ULTRASOUND ABDOMEN LIMITED RIGHT UPPER QUADRANT   COMPARISON:  MRI 01/03/2022.  Ultrasound 01/03/2022   FINDINGS: Gallbladder:   Sludge and small stones are noted in the lumen of the gallbladder. No gallbladder wall thickening or pericholecystic fluid. No sonographic Murphy sign.   Common bile duct:   Diameter: 2 mm   Liver:   No focal lesion identified. Within normal limits in parenchymal echogenicity. Portal vein is patent on color Doppler imaging with normal direction of blood flow towards the liver.   Other: None.   IMPRESSION: Sludge and small stones in the lumen of the gallbladder without biliary dilatation. No sonographic findings to suggest acute cholecystitis.     Electronically Signed   By: Misty Stanley M.D.   On: 02/02/2022 10:02  CLINICAL DATA:  Elevated liver function studies.  Cholelithiasis.   EXAM: MRI ABDOMEN WITHOUT  AND WITH CONTRAST (INCLUDING MRCP)   TECHNIQUE: Multiplanar multisequence MR imaging of the abdomen was performed both before and after the administration of intravenous contrast. Heavily T2-weighted images of the biliary and pancreatic ducts were obtained, and three-dimensional MRCP images were rendered by post processing.   CONTRAST:  59m GADAVIST GADOBUTROL 1 MMOL/ML IV SOLN   COMPARISON:  CT and ultrasound examinations, same date.   FINDINGS: Lower chest: The lung bases are clear of an acute process. No pleural or pericardial effusion.   Hepatobiliary: Diffuse and fairly marked fatty infiltration of the liver but no hepatic lesions or intrahepatic biliary dilatation. Several small layering gallstones are noted but no gallbladder distension, gallbladder thickening, mucosal enhancement and more pericholecystic inflammatory changes to suggest acute cholecystitis. Normal caliber and course of the common bile duct. No common bile duct stones are identified.   Pancreas:  No mass, inflammation or ductal dilatation.   Spleen:  Normal size.  No focal lesions.   Adrenals/Urinary Tract: Adrenal glands and kidneys are unremarkable. No worrisome renal lesions or hydronephrosis.   Stomach/Bowel: The stomach,  duodenum, visualized small bowel visualized colon are unremarkable   Vascular/Lymphatic: The aorta and branch vessels are patent. The major venous structures are patent. No mesenteric or retroperitoneal mass or adenopathy.   Other:  No ascites or abdominal wall hernia.   Musculoskeletal: No significant bony findings. Remote lower thoracic compression fractures and advanced degenerative disc disease at L5-S1.   IMPRESSION: 1. Diffuse and fairly marked fatty infiltration of the liver but no hepatic lesions or intrahepatic biliary dilatation. 2. Several small layering gallstones in the gallbladder but no MR findings for acute cholecystitis. 3. Normal caliber and course of the  common bile duct. No common bile duct stones. 4. Remote lower thoracic compression fractures and advanced degenerative disc disease at L5-S1.     Electronically Signed   By: Marijo Sanes M.D.   On: 01/03/2022 13:39 Within last 24 hrs: No results found.   Assessment Intracable n/v,  Chronic calculus cholecystitis with abnormal LFTs.        Patient Active Problem List    Diagnosis Date Noted   Chronic diarrhea 08/28/2021   Thrombocytopenia ([place]) 08/27/2021   Hypophosphatemia 08/27/2021   AKI (acute kidney injury) ([place]) 08/25/2021   Vertebral fracture, osteoporotic, initial encounter ([place]) 08/25/2021   Acute gastroenteritis 08/25/2021   Depression 08/25/2021   Sepsis ([place]) 08/25/2021   Overweight (BMI 25.0-29.9) 08/11/2021   Intractable nausea and vomiting 08/10/2021   Hypertensive urgency 08/10/2021   Dyslipidemia 08/10/2021   GERD (gastroesophageal reflux disease) 08/10/2021   Alcohol abuse 08/10/2021   Elevated LFTs 08/10/2021   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   This was discussed thoroughly.  Optimal plan is for robotic cholecystectomy utilizing ICG imaging. Risks and benefits have been discussed with the patient which include but are not limited to anesthesia, bleeding, infection, biliary ductal injury, resulting in leak or stenosis, other associated unanticipated injuries affiliated with laparoscopic surgery.   Reviewed that removing the gallbladder will only address the symptoms related to the gallbladder itself.  I believe there is the desire to proceed, accepting the risks with understanding.  Questions elicited and  answered to satisfaction.    No guarantees ever expressed or implied.     Face-to-face time spent with the patient and accompanying care providers(if present) was 40 minutes, with more than 50% of the time spent counseling, educating, and coordinating care of the patient.     These notes generated with voice recognition software. I apologize for typographical errors.

## 2022-02-02 NOTE — Op Note (Signed)
Robotic cholecystectomy with Indocyamine Green Ductal Imaging.   Pre-operative Diagnosis: Chronic calculus cholecystitis  Post-operative Diagnosis:  Same.  Procedure: Robotic assisted laparoscopic cholecystectomy with Indocyamine Green Ductal Imaging.   Surgeon: Ronny Bacon, M.D., FACS  Anesthesia: General. with endotracheal tube  Findings: Fatty adhesions to the dorsal aspect of the gallbladder consistent with chronic disease.  Estimated Blood Loss: 15 mL         Drains: None         Specimens: Gallbladder           Complications: none  Procedure Details  The patient was seen again in the Holding Room.  1.25 mg dose of ICG was administered intravenously.   The benefits, complications, treatment options, risks and expected outcomes were again reviewed with the patient. The likelihood of improving the patient's symptoms with return to their baseline status is good.  The patient and/or family concurred with the proposed plan, giving informed consent, again alternatives reviewed.  The patient was taken to Operating Room, identified, and the procedure verified as robotic assisted laparoscopic cholecystectomy.  Prior to the induction of general anesthesia, antibiotic prophylaxis was administered. VTE prophylaxis was in place. General endotracheal anesthesia was then administered and tolerated well. The patient was positioned in the supine position.  After the induction, the abdomen was prepped with Chloraprep and draped in the sterile fashion.  A Time Out was held and the above information confirmed.  Right para-umbilical local infiltration with quarter percent Marcaine with epinephrine is utilized.  Made a 12 mm incision on the right periumbilical site, I advanced an optical 57m port under direct visualization into the peritoneal cavity.  Once the peritoneum was penetrated, insufflation was initiated.  The trocar was then advanced into the abdominal cavity under direct  visualization. Pneumoperitoneum was then continued utilizing CO2 at 15 mmHg or less and tolerated well without any adverse changes in the patient's vital signs.  Two 8.5-mm ports were placed in the left lower quadrant and laterally, and one to the right lower quadrant, all under direct vision. All skin incisions  were infiltrated with a local anesthetic agent before making the incision and placing the trocars.  The patient was positioned  in reverse Trendelenburg, tilted the patient's left side down.  Da Vinci XI robot was then positioned on to the patient's left side, and docked.  The gallbladder was identified, the fundus grasped via the arm 4 Prograsp and retracted cephalad. Adhesions were lysed with scissors and cautery.  The infundibulum was identified grasped and retracted laterally, exposing the peritoneum overlying the triangle of Calot. This was then opened and dissected using cautery & scissors. An extended critical view of the cystic duct and cystic artery was obtained, aided by the ICG via FireFly which improved localization of the ductal anatomy.    The cystic duct was clearly identified and dissected to isolation.   Artery well isolated and clipped, and the cystic duct was triple clipped and divided with scissors, as close to the gallbladder neck as feasible, thus leaving two on the remaining stump.  The specimen side of the artery is sealed with bipolar and divided with monopolar scissors.   The gallbladder was taken from the gallbladder fossa in a retrograde fashion with the electrocautery. The gallbladder was removed and placed in an Endocatch bag.  The liver bed is inspected. Hemostasis was confirmed.  The robot was undocked and moved away from the operative field. No irrigation was utilized. The gallbladder and Endocatch sac were then removed  through the infraumbilical port site, without any dilatation needed..   Inspection of the right upper quadrant was performed. No bleeding, bile  duct injury or leak, or bowel injury was noted. The infra-umbilical port site fascia was closed with interrumpted 0 Vicryl sutures using PMI/cone under direct visualization. Pneumoperitoneum was released and ports removed.  4-0 subcuticular Monocryl was used to close the skin. Dermabond was  applied.  The patient was then extubated and brought to the recovery room in stable condition. Sponge, lap, and needle counts were correct at closure and at the conclusion of the case.               Ronny Bacon, M.D., [facility] 02/02/2022 5:44 PM

## 2022-02-02 NOTE — ED Provider Notes (Signed)
[facility] Provider Note    Event Date/Time   First MD Initiated Contact with Patient 02/02/22 [phone]     (approximate)  History   Chief Complaint: Emesis  HPI  Shirley Evans is a 56 y.o. female with a past medical history of COPD, gastric reflux, hypertension, presents to the emergency department for nausea vomiting and diarrhea.  According to the patient for the past year or so every month or every other month she experiences episodes of vomiting and diarrhea to if she gets dehydrated and needs to come to the emergency department.  Patient has been told in the past that this could be due to her gallbladder she has seen a surgeon but they have not yet scheduled her cholecystectomy.  Patient states since Sunday (5 days) she has been experiencing nausea vomiting diarrhea as well as weakness.  Patient states chills at home but has not measured a temperature.  No dysuria.  No chest pain.  States vague abdominal discomfort but no focal pain.  Physical Exam   Triage Vital Signs: ED Triage Vitals  Enc Vitals Group     BP 02/02/22 0803 (!) 167/130     Pulse Rate 02/02/22 0802 (!) 127     Resp 02/02/22 0802 (!) 22     Temp --      Temp src --      SpO2 02/02/22 0802 98 %     Weight 02/02/22 0802 139 lb 12.4 oz (63.4 kg)     Height 02/02/22 0802 '5\' 3"'$  (1.6 m)     Head Circumference --      Peak Flow --      Pain Score 02/02/22 0802 10     Pain Loc --      Pain Edu? --      Excl. in Stem? --     Most recent vital signs: Vitals:   02/02/22 0802 02/02/22 0803  BP:  (!) 167/130  Pulse: (!) 127   Resp: (!) 22   SpO2: 98%     General: Awake, no distress.  CV:  Good peripheral perfusion.  Regular rhythm rate around 120 bpm. Resp:  Normal effort.  Equal breath sounds bilaterally.  Abd:  No distention.  Soft, mild diffuse tenderness without focal tenderness identified.  No rebound or guarding.    ED Results / Procedures / Treatments   EKG  EKG viewed  and interpreted by myself shows sinus tachycardia at 124 bpm with a narrow QRS, normal axis, normal intervals, nonspecific ST changes.  RADIOLOGY  Right upper quadrant ultrasound shows sludge but no signs of acute cholecystitis.   MEDICATIONS ORDERED IN ED: Medications  sodium chloride 0.9 % bolus 1,000 mL (has no administration in time range)  ondansetron (ZOFRAN) injection 4 mg (has no administration in time range)  HYDROmorphone (DILAUDID) injection 0.5 mg (has no administration in time range)     IMPRESSION / MDM / ASSESSMENT AND PLAN / ED COURSE  I reviewed the triage vital signs and the nursing notes.  Patient's presentation is most consistent with acute presentation with potential threat to life or bodily function.  Patient presents emergency department for nausea vomiting and diarrhea.  Patient states this has been an ongoing issue every month or every other month for the past year or more.  Patient states this current episode has been going on for 4 or 5 days and now she feels dehydrated.  Is complaining of vague abdominal discomfort but no focal pain.  Patient states she has seen a surgeon about having her gallbladder removed but that has not been scheduled as of yet.  On exam patient has mild diffuse tenderness without any focal area of tenderness identified especially in the right upper quadrant no significant tenderness.  We will check labs including LFTs lipase.  We will treat pain nausea IV hydrate and continue to closely monitor.  Patient agreeable to plan of care.  Patient's labs have resulted showing elevated LFTs and total bilirubin on her chemistry.  Patient CBC does show a moderate leukocytosis 14,900 and appears to be somewhat hemoconcentrated with a hemoglobin of 17.3.  Patient receiving a second liter of IV fluids.  We will obtain a right upper quadrant ultrasound which shows sludge but no signs of cholecystitis or CBD dilation.  Patient saw Dr. Christian Mate Monday for her  gallbladder and they are planning to schedule an outpatient cholecystectomy.  However given the patient's worsening nausea vomiting and now worsening LFT elevation in white blood cell count.  I spoke to Dr. Christian Mate who has been down to see the patient.  He believes the patient would benefit from admission to the hospital for cholecystectomy and continued treatment.  He is not entirely sure that the patient's gallbladder is the root of her discomfort and vomiting episodes but does believe cholecystectomy is warranted.  Patient wishes to have the gallbladder removed.  Patient still somewhat nauseated we will dose Reglan and continue with IV hydration.  We will discuss with the hospitalist for admission versus consultation.  FINAL CLINICAL IMPRESSION(S) / ED DIAGNOSES   Nausea vomiting diarrhea Weakness Elevated LFTs Biliary colic  Note:  This document was prepared using Dragon voice recognition software and may include unintentional dictation errors.   Harvest Dark, MD 02/02/22 1145

## 2022-02-02 NOTE — Anesthesia Procedure Notes (Signed)
Procedure Name: Intubation Date/Time: 02/02/2022 4:40 PM  Performed by: Jerrye Noble, CRNAPre-anesthesia Checklist: Patient identified, Emergency Drugs available, Suction available and Patient being monitored Patient Re-evaluated:Patient Re-evaluated prior to induction Oxygen Delivery Method: Circle system utilized Preoxygenation: Pre-oxygenation with 100% oxygen Induction Type: IV induction Ventilation: Mask ventilation without difficulty Laryngoscope Size: McGraph and 3 Grade View: Grade I Tube type: Oral Tube size: 6.5 mm Number of attempts: 1 Airway Equipment and Method: Stylet and Oral airway Placement Confirmation: ETT inserted through vocal cords under direct vision, positive ETCO2 and breath sounds checked- equal and bilateral Secured at: 20 cm Tube secured with: Tape Dental Injury: Teeth and Oropharynx as per pre-operative assessment

## 2022-02-02 NOTE — ED Notes (Signed)
Spoke with admitting, Dr. Francine Graven regarding patient high BP   Per MD wait until 1 hours post HTN meds to give IV meds for BP.

## 2022-02-02 NOTE — Interval H&P Note (Signed)
History and Physical Interval Note:  02/02/2022 4:10 PM  Shirley Evans  has presented today for surgery, with the diagnosis of cholecystitis.  The various methods of treatment have been discussed with the patient and family. After consideration of risks, benefits and other options for treatment, the patient has consented to  Procedure(s): XI ROBOTIC ASSISTED LAPAROSCOPIC CHOLECYSTECTOMY (N/A) [place] (ICG) (N/A) as a surgical intervention.  The patient's history has been reviewed, patient examined, no change in status, stable for surgery.  I have reviewed the patient's chart and labs.  Questions were answered to the patient's satisfaction.     Ronny Bacon

## 2022-02-02 NOTE — Assessment & Plan Note (Signed)
Place patient on IV PPI 

## 2022-02-02 NOTE — Assessment & Plan Note (Signed)
Secondary to GI losses from nausea, vomiting and diarrhea with concomitant diuretic use Supplement potassium Check magnesium levels

## 2022-02-02 NOTE — ED Notes (Signed)
Pt reports improvement in nausea after IV Reglan - BP remains high but patient able to keep oral BP medications down.  No needs at this time. Patient resting with eyes closed in stretcher.

## 2022-02-02 NOTE — H&P (Signed)
History and Physical    Patient: Shirley Evans ZWC:[id number] DOB: Feb 11, 1966 DOA: 02/02/2022 DOS: the patient was seen and examined on 02/02/2022 PCP: Romualdo Bolk, FNP  Patient coming from: Home  Chief Complaint:  Chief Complaint  Patient presents with   Emesis   HPI: Shirley Evans is a 56 y.o. female with medical history significant for COPD, hypertension, GERD who presents to the emergency room for evaluation of nausea, vomiting and diarrhea for about 5 days.  She has had subjective fever and chills.  She also complains of generalized weakness. Patient states that she has had intermittent symptoms for over a year and has lost a significant amount of weight.  She is unable to tell me if her symptoms are related to meals.  She had an MRCP done as an outpatient for evaluation of transaminitis which showed diffuse and fairly marked fatty infiltration of the liver but no hepatic lesions or intrahepatic biliary dilatation. Several small layering gallstones in the gallbladder but no MR findings for acute cholecystitis. Normal caliber and course of the common bile duct. No common bile duct stones. She has been unable to tolerate any oral intake and has not been able to take any of her oral meds.  She complains of abdominal pain mostly in the periumbilical area and right upper quadrant.  She denies having any chest pain, no shortness of breath, no headache, no dizziness, no lightheadedness, no urinary symptoms, no blurred vision or focal deficit. Labs show transaminitis and elevated alkaline phosphatase levels, white count of 14.9 Gallbladder ultrasound shows sludge and small stones in the lumen of the gallbladder without biliary dilatation. No sonographic findings to suggest acute cholecystitis.  Review of Systems: As mentioned in the history of present illness. All other systems reviewed and are negative. Past Medical History:  Diagnosis Date   Cancer ([place])    cervical   COPD  (chronic obstructive pulmonary disease) (HCC)    GERD (gastroesophageal reflux disease)    History of Helicobacter pylori infection    Hypertension    Menopausal state    Osteopenia    Postmenopausal atrophic vaginitis    Past Surgical History:  Procedure Laterality Date   BREAST BIOPSY Bilateral    neg   TOTAL HIP ARTHROPLASTY Left 12/22/2020   Procedure: TOTAL HIP ARTHROPLASTY ANTERIOR APPROACH;  Surgeon: Hessie Knows, MD;  Location: [facility] ORS;  Service: Orthopedics;  Laterality: Left;   Social History:  reports that she has quit smoking. Her smoking use included cigarettes. She has a 30.00 pack-year smoking history. She has never used smokeless tobacco. She reports that she does not currently use alcohol after a past usage of about 8.0 standard drinks of alcohol per week. She reports current drug use. Drug: Marijuana.  Allergies  Allergen Reactions   Demerol [Meperidine] Nausea Only and Nausea And Vomiting   Codeine Nausea Only    Family History  Problem Relation Age of Onset   Diabetes Mother    Hypertension Mother    Breast cancer Paternal Aunt 8   Hypertension Father     Prior to Admission medications   Medication Sig Start Date End Date Taking? Authorizing Provider  albuterol (PROVENTIL) (2.5 MG/3ML) 0.083% nebulizer solution Inhale into the lungs. 07/09/19 01/30/22  [provider]  albuterol (VENTOLIN HFA) 108 (90 Base) MCG/ACT inhaler Inhale into the lungs. 07/09/19   [provider]  buPROPion (WELLBUTRIN) 75 MG tablet Take 75 mg by mouth 3 (three) times daily. 06/19/19 12/14/21  [provider]  cholecalciferol (VITAMIN D3) 25 MCG (1000 UNIT) tablet Take 1,000 Units by mouth daily.    [provider]  cyclobenzaprine (FLEXERIL) 10 MG tablet Take 10 mg by mouth daily. 06/19/19   [provider]  dicyclomine (BENTYL) 10 MG capsule Take 1 capsule (10 mg total) by mouth 3 (three) times daily as needed (abd pain). 12/12/21   Nance Pear, MD  lidocaine (LIDODERM) 5 % Place onto the skin. 04/01/19   [provider]  lisinopril-hydrochlorothiazide (ZESTORETIC) 20-12.5 MG tablet Take 1 tablet by mouth daily. 11/29/21   [provider]  loperamide (IMODIUM) 2 MG capsule Take 1 capsule (2 mg total) by mouth as needed for diarrhea or loose stools. 08/12/21   Annita Brod, MD  magnesium gluconate (MAGONATE) 500 MG tablet Take 1 tablet (500 mg total) by mouth 2 (two) times daily. 09/01/21   Cherene Altes, MD  omeprazole (PRILOSEC) 20 MG capsule Take 1 capsule (20 mg total) by mouth 2 (two) times daily before a meal. 08/12/21 08/07/22  Annita Brod, MD  ondansetron (ZOFRAN-ODT) 4 MG disintegrating tablet Take 1 tablet (4 mg total) by mouth every 8 (eight) hours as needed for nausea or vomiting. 01/03/22   Carrie Mew, MD  oxyCODONE-acetaminophen (PERCOCET) 5-325 MG tablet Take 1 tablet by mouth every 8 (eight) hours as needed for severe pain. 12/21/21   Geronimo Boot, PA-C  oxyCODONE-acetaminophen (PERCOCET) 5-325 MG tablet Take 1 tablet by mouth every 4 (four) hours as needed for severe pain. 01/30/22   Meade Maw, MD  rosuvastatin (CRESTOR) 40 MG tablet Take 40 mg by mouth daily. 06/19/19 12/14/21  [provider]  sertraline (ZOLOFT) 100 MG tablet Take 100 mg by mouth daily. 06/19/19   [provider]  valACYclovir (VALTREX) 1000 MG tablet Take 1,000 mg by mouth daily. 11/08/20   [provider]    Physical Exam: Vitals:   02/02/22 1030 02/02/22 1146 02/02/22 1147 02/02/22 1200  BP: (!) 162/122 (!) 153/137 (!) 153/137 (!) 161/125  Pulse: (!) 107     Resp: '20  20 11  '$ Temp:   97.7 F (36.5 C)   TempSrc:   Oral   SpO2:   98%   Weight:      Height:       Physical Exam Vitals and nursing note reviewed.  Constitutional:      Appearance: Normal appearance.  HENT:     Head: Normocephalic and atraumatic.     Nose: Nose normal.     Mouth/Throat:     Mouth: Mucous  membranes are dry.  Eyes:     Conjunctiva/sclera: Conjunctivae normal.  Cardiovascular:     Rate and Rhythm: Normal rate and regular rhythm.  Pulmonary:     Effort: Pulmonary effort is normal.     Breath sounds: Normal breath sounds.  Abdominal:     General: Abdomen is flat. Bowel sounds are normal.     Palpations: Abdomen is soft.     Tenderness: There is abdominal tenderness.     Comments: Tender in the right upper quadrant and periumbilical  Musculoskeletal:        General: Normal range of motion.     Cervical back: Normal range of motion and neck supple.  Skin:    General: Skin is warm and dry.  Neurological:     General: No focal deficit present.     Mental Status: She is oriented to person, place, and time.  Psychiatric:  Mood and Affect: Mood normal.        Behavior: Behavior normal.     Data Reviewed: Relevant notes from primary care and specialist visits, past discharge summaries as available in EHR, including Care Everywhere. Prior diagnostic testing as pertinent to current admission diagnoses Updated medications and problem lists for reconciliation ED course, including vitals, labs, imaging, treatment and response to treatment Triage notes, nursing and pharmacy notes and ED provider's notes Notable results as noted in HPI Labs reviewed.  Sodium 134, potassium 3.1, chloride 88, bicarb 27, glucose 234, BUN 7, creatinine 1.11, calcium 8.7, total protein 8.2, albumin 3.9, AST 195, ALT 124, alkaline phos is 244, total bilirubin 3.9, lipase 37, white count 14.9, hemoglobin 17.3, hematocrit 48.4, platelet count 274 Twelve-lead EKG reviewed by me shows sinus tachycardia with ST depression in the lateral leads There are no new results to review at this time.  Assessment and Plan: * Refractory nausea and vomiting Patient presents for evaluation of refractory nausea and vomiting and has had several episodes in the past. She has transaminitis and had a recent MRCP which  showed diffuse and fairly marked fatty infiltration of the liver but no hepatic lesions or intrahepatic biliary dilatation. Several small layering gallstones in the gallbladder but no MR findings for acute cholecystitis. Normal caliber and course of the common bile duct. No common bile duct stones. Supportive care with IV fluid hydration, antiemetics and IV PPI We will consult surgery to evaluate patient for possible gallbladder disease  AKI (acute kidney injury) ([place]) Pre renal secondary to GI losses from nausea, vomiting and diarrhea Hydrate patient Repeat renal parameters in a.m.  Hypokalemia Secondary to GI losses from nausea, vomiting and diarrhea with concomitant diuretic use Supplement potassium Check magnesium levels  Hypertensive urgency Patient has been unable to take her antihypertensive medications due to persistent nausea and vomiting. Place patient on IV hydralazine for systolic blood pressure greater than 132mHg  GERD (gastroesophageal reflux disease) Place patient on IV PPI  Depression Continue sertraline      Advance Care Planning:   Code Status: Full Code   Consults: Surgery  Family Communication: Greater than 50% of time was spent discussing patient's condition and plan of care with her in detail.  She verbalizes understanding and agrees to the plan  Severity of Illness: The appropriate patient status for this patient is INPATIENT. Inpatient status is judged to be reasonable and necessary in order to provide the required intensity of service to ensure the patient's safety. The patient's presenting symptoms, physical exam findings, and initial radiographic and laboratory data in the context of their chronic comorbidities is felt to place them at high risk for further clinical deterioration. Furthermore, it is not anticipated that the patient will be medically stable for discharge from the hospital within 2 midnights of admission.   * I certify that at the point  of admission it is my clinical judgment that the patient will require inpatient hospital care spanning beyond 2 midnights from the point of admission due to high intensity of service, high risk for further deterioration and high frequency of surveillance required.*  Author: TCollier Bullock MD 02/02/2022 1:04 PM  For on call review [email or web address]

## 2022-02-02 NOTE — Progress Notes (Signed)
Request for medical clearance has been faxed to Hassell Halim office at [facility].

## 2022-02-03 DIAGNOSIS — R112 Nausea with vomiting, unspecified: Secondary | ICD-10-CM | POA: Diagnosis not present

## 2022-02-03 DIAGNOSIS — K8010 Calculus of gallbladder with chronic cholecystitis without obstruction: Secondary | ICD-10-CM | POA: Diagnosis not present

## 2022-02-03 LAB — GASTROINTESTINAL PANEL BY PCR, STOOL (REPLACES STOOL CULTURE)

## 2022-02-03 LAB — BASIC METABOLIC PANEL
Anion gap: 12 (ref 5–15)
BUN: 7 mg/dL (ref 6–20)
CO2: 23 mmol/L (ref 22–32)
Calcium: 7.2 mg/dL — ABNORMAL LOW (ref 8.9–10.3)
Chloride: 98 mmol/L (ref 98–111)
Creatinine, Ser: 0.83 mg/dL (ref 0.44–1.00)
GFR, Estimated: >60 mL/min (ref >60)
Glucose, Bld: 121 mg/dL — ABNORMAL HIGH (ref 70–99)
Potassium: 4 mmol/L (ref 3.5–5.1)
Sodium: 133 mmol/L — ABNORMAL LOW (ref 135–145)

## 2022-02-03 LAB — CBC
HCT: 40.1 % (ref 36.0–46.0)
Hemoglobin: 14 g/dL (ref 12.0–15.0)
MCH: 35.3 pg — ABNORMAL HIGH (ref 26.0–34.0)
MCHC: 34.9 g/dL (ref 30.0–36.0)
MCV: 101 fL — ABNORMAL HIGH (ref 80.0–100.0)
Platelets: 195 10*3/uL (ref 150–400)
RBC: 3.97 MIL/uL (ref 3.87–5.11)
RDW: 13 % (ref 11.5–15.5)
WBC: 14.1 10*3/uL — ABNORMAL HIGH (ref 4.0–10.5)
nRBC: 0 % (ref 0.0–0.2)

## 2022-02-03 LAB — MAGNESIUM: Magnesium: 1.6 mg/dL — ABNORMAL LOW (ref 1.7–2.4)

## 2022-02-03 LAB — HIV ANTIBODY (ROUTINE TESTING W REFLEX): HIV Screen 4th Generation wRfx: NONREACTIVE

## 2022-02-03 MED ORDER — LISINOPRIL 20 MG PO TABS
20.0000 mg | ORAL_TABLET | Freq: Every day | ORAL | Status: DC
  Administered 2022-02-03: 20 mg via ORAL
  Filled 2022-02-03: qty 1

## 2022-02-03 MED ORDER — LISINOPRIL 20 MG PO TABS: 20.0000 mg | ORAL_TABLET | Freq: Every day | ORAL | 0 refills | Status: AC

## 2022-02-03 MED ORDER — MAGNESIUM SULFATE 2 GM/50ML IV SOLN
2.0000 g | Freq: Once | INTRAVENOUS | Status: AC
  Administered 2022-02-03: 2 g via INTRAVENOUS
  Filled 2022-02-03: qty 50

## 2022-02-03 NOTE — Care Management CC44 (Signed)
Condition Code 44 Documentation Completed  Patient Details  Name: ANAHID ESKELSON MRN: [id number] Date of Birth: [birth year]   Condition Code 44 given:  Yes Patient signature on Condition Code 44 notice:  Yes Documentation of 2 MD's agreement:  Yes Code 44 added to claim:  Yes  Reviewed with patient via phone, copy printed for patient's records.    Alayla Dethlefs E Tyler Robidoux, LCSW 02/03/2022, 12:26 PM

## 2022-02-03 NOTE — Plan of Care (Signed)

## 2022-02-03 NOTE — Discharge Summary (Signed)
Physician Discharge Summary  [name] IOX:[id number] DOB: February 18, 1966 DOA: 02/02/2022  PCP: Romualdo Bolk, FNP  Admit date: 02/02/2022 Discharge date: 02/03/2022  Admitted From: home Discharge disposition: home   Recommendations for Outpatient Follow-Up:   GS follow up s/p GB removal BMP 1 week   Discharge Diagnosis:   Principal Problem:   Refractory nausea and vomiting Active Problems:   AKI (acute kidney injury) ([place])   Hypokalemia   Hypertensive urgency   Elevated liver function tests   GERD (gastroesophageal reflux disease)   Depression   CCC (chronic calculous cholecystitis)    Discharge Condition: Improved.  Diet recommendation: soft  Wound care: None.  Code status: Full.   History of Present Illness:   Shirley Evans is a 56 y.o. female with medical history significant for COPD, hypertension, GERD who presents to the emergency room for evaluation of nausea, vomiting and diarrhea for about 5 days.  She has had subjective fever and chills.  She also complains of generalized weakness. Patient states that she has had intermittent symptoms for over a year and has lost a significant amount of weight.  She is unable to tell me if her symptoms are related to meals.  She had an MRCP done as an outpatient for evaluation of transaminitis which showed diffuse and fairly marked fatty infiltration of the liver but no hepatic lesions or intrahepatic biliary dilatation. Several small layering gallstones in the gallbladder but no MR findings for acute cholecystitis. Normal caliber and course of the common bile duct. No common bile duct stones. She has been unable to tolerate any oral intake and has not been able to take any of her oral meds.  She complains of abdominal pain mostly in the periumbilical area and right upper quadrant.  She denies having any chest pain, no shortness of breath, no headache, no dizziness, no lightheadedness, no urinary symptoms, no  blurred vision or focal deficit. Labs show transaminitis and elevated alkaline phosphatase levels, white count of 14.9 Gallbladder ultrasound shows sludge and small stones in the lumen of the gallbladder without biliary dilatation. No sonographic findings to suggest acute cholecystitis.   Hospital Course by Problem:   Chronic calculus cholecystitis  S/p Robotic assisted laparoscopic cholecystectomy with Indocyamine Green Ductal Imaging.    AKI (acute kidney injury) ([place]) -resolved.   Hypokalemia/hypomagnesemia -replete   Hypertensive urgency -resume home meds minus HCTZ as low K and Mg   GERD (gastroesophageal reflux disease) -PPI   Depression Continue sertraline    Medical Consultants:   GS   Discharge Exam:   Vitals:   02/03/22 0804 02/03/22 1240  BP: (!) 124/93 (!) 129/93  Pulse: 67 80  Resp: 18 19  Temp: 98.3 F (36.8 C) 98.3 F (36.8 C)  SpO2: 100% 99%   Vitals:   02/03/22 0355 02/03/22 0548 02/03/22 0804 02/03/22 1240  BP: (!) 153/115 (!) 152/110 (!) 124/93 (!) 129/93  Pulse: 72  67 80  Resp: '17  18 19  '$ Temp: 98.2 F (36.8 C)  98.3 F (36.8 C) 98.3 F (36.8 C)  TempSrc:   Oral Oral  SpO2: 100%  100% 99%  Weight:      Height:        General exam: Appears calm and comfortable.    The results of significant diagnostics from this hospitalization (including imaging, microbiology, ancillary and laboratory) are listed below for reference.     Procedures and Diagnostic Studies:   US ABDOMEN LIMITED RUQ (LIVER/GB)  Result Date: 02/02/2022 CLINICAL DATA:  Abdominal pain.  Elevated LFTs. EXAM: ULTRASOUND ABDOMEN LIMITED RIGHT UPPER QUADRANT COMPARISON:  MRI 01/03/2022.  Ultrasound 01/03/2022 FINDINGS: Gallbladder: Sludge and small stones are noted in the lumen of the gallbladder. No gallbladder wall thickening or pericholecystic fluid. No sonographic Murphy sign. Common bile duct: Diameter: 2 mm Liver: No focal lesion identified. Within normal limits  in parenchymal echogenicity. Portal vein is patent on color Doppler imaging with normal direction of blood flow towards the liver. Other: None. IMPRESSION: Sludge and small stones in the lumen of the gallbladder without biliary dilatation. No sonographic findings to suggest acute cholecystitis. Electronically Signed   By: Misty Stanley M.D.   On: 02/02/2022 10:02     Labs:   Basic Metabolic Panel: Recent Labs  Lab 02/02/22 0830 02/02/22 1939 02/03/22 0426 02/03/22 0435  NA 134* 134*  --  133*  K 3.1* 2.6*  --  4.0  CL 88* 94*  --  98  CO2 27 25  --  23  GLUCOSE 234* 205*  --  121*  BUN 7 7  --  7  CREATININE 1.11* 0.87  --  0.83  CALCIUM 8.7* 7.2*  --  7.2*  MG 0.9*  --  1.6*  --    GFR Estimated Creatinine Clearance: 67.1 mL/min (by C-G formula based on SCr of 0.83 mg/dL). Liver Function Tests: Recent Labs  Lab 02/02/22 0830 02/02/22 1939  AST 195* 140*  ALT 124* 78*  ALKPHOS 244* 168*  BILITOT 3.9* 2.0*  PROT 8.2* 6.2*  ALBUMIN 3.9 2.9*   Recent Labs  Lab 02/02/22 0830  LIPASE 37   No results for input(s): "AMMONIA" in the last 168 hours. Coagulation profile Recent Labs  Lab 02/02/22 1939  INR 1.1    CBC: Recent Labs  Lab 02/02/22 0830 02/02/22 1939 02/03/22 0435  WBC 14.9* 14.3* 14.1*  NEUTROABS  --  12.7*  --   HGB 17.3* 14.3 14.0  HCT 48.4* 40.5 40.1  MCV 97.0 99.3 101.0*  PLT 274 196 195   Cardiac Enzymes: No results for input(s): "CKTOTAL", "CKMB", "CKMBINDEX", "TROPONINI" in the last 168 hours. BNP: Invalid input(s): "POCBNP" CBG: No results for input(s): "GLUCAP" in the last 168 hours. D-Dimer No results for input(s): "DDIMER" in the last 72 hours. Hgb A1c No results for input(s): "HGBA1C" in the last 72 hours. Lipid Profile No results for input(s): "CHOL", "HDL", "LDLCALC", "TRIG", "CHOLHDL", "LDLDIRECT" in the last 72 hours. Thyroid function studies No results for input(s): "TSH", "T4TOTAL", "T3FREE", "THYROIDAB" in the last 72  hours.  Invalid input(s): "FREET3" Anemia work up No results for input(s): "VITAMINB12", "FOLATE", "FERRITIN", "TIBC", "IRON", "RETICCTPCT" in the last 72 hours. Microbiology Recent Results (from the past 240 hour(s))  Gastrointestinal Panel by PCR , Stool     Status: None   Collection Time: 02/03/22  9:15 AM   Specimen: STOOL  Result Value Ref Range Status   Campylobacter species NOT DETECTED NOT DETECTED Final   Plesimonas shigelloides NOT DETECTED NOT DETECTED Final   Salmonella species NOT DETECTED NOT DETECTED Final   Yersinia enterocolitica NOT DETECTED NOT DETECTED Final   Vibrio species NOT DETECTED NOT DETECTED Final   Vibrio cholerae NOT DETECTED NOT DETECTED Final   Enteroaggregative E coli (EAEC) NOT DETECTED NOT DETECTED Final   Enteropathogenic E coli (EPEC) NOT DETECTED NOT DETECTED Final   Enterotoxigenic E coli (ETEC) NOT DETECTED NOT DETECTED Final   Shiga like toxin producing E coli (STEC) NOT DETECTED  NOT DETECTED Final   Shigella/Enteroinvasive E coli (EIEC) NOT DETECTED NOT DETECTED Final   Cryptosporidium NOT DETECTED NOT DETECTED Final   Cyclospora cayetanensis NOT DETECTED NOT DETECTED Final   Entamoeba histolytica NOT DETECTED NOT DETECTED Final   Giardia lamblia NOT DETECTED NOT DETECTED Final   Adenovirus F40/41 NOT DETECTED NOT DETECTED Final   Astrovirus NOT DETECTED NOT DETECTED Final   Norovirus GI/GII NOT DETECTED NOT DETECTED Final   Rotavirus A NOT DETECTED NOT DETECTED Final   Sapovirus (I, II, IV, and V) NOT DETECTED NOT DETECTED Final    Comment: Performed at [facility], [address] [place], [place] [address]     Discharge Instructions:   Discharge Instructions     Discharge instructions   Complete by: As directed    Soft diet   Increase activity slowly   Complete by: As directed       Allergies as of 02/03/2022       Reactions   Demerol [meperidine] Nausea Only, Nausea And Vomiting   Codeine Nausea Only         Medication List     STOP taking these medications    lisinopril-hydrochlorothiazide 20-12.5 MG tablet Commonly known as: ZESTORETIC       TAKE these medications    albuterol 108 (90 Base) MCG/ACT inhaler Commonly known as: VENTOLIN HFA Inhale into the lungs.   albuterol (2.5 MG/3ML) 0.083% nebulizer solution Commonly known as: PROVENTIL Inhale into the lungs.   buPROPion 75 MG tablet Commonly known as: WELLBUTRIN Take 75 mg by mouth 3 (three) times daily.   cholecalciferol 25 MCG (1000 UNIT) tablet Commonly known as: VITAMIN D3 Take 1,000 Units by mouth daily.   cyclobenzaprine 10 MG tablet Commonly known as: FLEXERIL Take 10 mg by mouth 2 (two) times daily.   dicyclomine 10 MG capsule Commonly known as: BENTYL Take 1 capsule (10 mg total) by mouth 3 (three) times daily as needed (abd pain).   lidocaine 5 % Commonly known as: [place] onto the skin.   lisinopril 20 MG tablet Commonly known as: ZESTRIL Take 1 tablet (20 mg total) by mouth daily. Start taking on: February 04, 2022   loperamide 2 MG capsule Commonly known as: IMODIUM Take 1 capsule (2 mg total) by mouth as needed for diarrhea or loose stools.   magnesium gluconate 500 MG tablet Commonly known as: MAGONATE Take 1 tablet (500 mg total) by mouth 2 (two) times daily.   omeprazole 40 MG capsule Commonly known as: PRILOSEC Take 40 mg by mouth 2 (two) times daily. What changed: Another medication with the same name was removed. Continue taking this medication, and follow the directions you see here.   ondansetron 4 MG disintegrating tablet Commonly known as: ZOFRAN-ODT Take 1 tablet (4 mg total) by mouth every 8 (eight) hours as needed for nausea or vomiting.   oxyCODONE-acetaminophen 5-325 MG tablet Commonly known as: Percocet Take 1 tablet by mouth every 4 (four) hours as needed for severe pain. What changed: Another medication with the same name was removed. Continue taking this  medication, and follow the directions you see here.   rosuvastatin 40 MG tablet Commonly known as: CRESTOR Take 40 mg by mouth daily.   sertraline 100 MG tablet Commonly known as: ZOLOFT Take 100 mg by mouth daily.   valACYclovir 1000 MG tablet Commonly known as: VALTREX Take 1,000 mg by mouth daily.          Time coordinating discharge: 45 min  Signed:  Geradine Girt DO  [facility] 02/03/2022, 12:49 PM

## 2022-02-03 NOTE — Discharge Instructions (Signed)
Have held the HCTZ portion of you BP medications due to low K and low Mg- will defer to PCP to restart if needed

## 2022-02-03 NOTE — Progress Notes (Signed)
02/03/2022  Subjective: Patient is 1 Day Post-Op status post robotic assisted cholecystectomy with Dr. Christian Mate yesterday.  No acute events overnight.  Patient denies any worsening pain.  Tolerating full liquid diet.  Pain is controlled.  Vital signs: Temp:  [96.9 F (36.1 C)-98.4 F (36.9 C)] 98.3 F (36.8 C) (11/11 1240) Pulse Rate:  [59-88] 80 (11/11 1240) Resp:  [12-23] 19 (11/11 1240) BP: (94-153)/(70-115) 129/93 (11/11 1240) SpO2:  [93 %-100 %] 99 % (11/11 1240) Weight:  [63.4 kg] 63.4 kg (11/10 1558)   Intake/Output: 11/10 0701 - 11/11 0700 In: 8563 [P.O.:420; I.V.:267.9; IV Piggyback:661.1] Out: 0  Last BM Date : 02/03/22  Physical Exam: Constitutional: No acute distress Abdomen: Soft, nondistended, appropriately sore to palpation.  Incisions are clean, dry, intact.  Labs:  Recent Labs    02/02/22 1939 02/03/22 0435  WBC 14.3* 14.1*  HGB 14.3 14.0  HCT 40.5 40.1  PLT 196 195   Recent Labs    02/02/22 1939 02/03/22 0435  NA 134* 133*  K 2.6* 4.0  CL 94* 98  CO2 25 23  GLUCOSE 205* 121*  BUN 7 7  CREATININE 0.87 0.83  CALCIUM 7.2* 7.2*   Recent Labs    02/02/22 1939  LABPROT 13.8  INR 1.1    Imaging: No results found.  Assessment/Plan: This is a 56 y.o. female s/p robotic assisted cholecystectomy.  - Patient is doing well today.  We will advance her to a soft diet for lunch.  If she does well with this, she may be able to DC home later today. - Follow-up with Dr. Christian Mate in 2 weeks.   Melvyn Neth, [facility]

## 2022-02-03 NOTE — Progress Notes (Signed)
Discharge instructions reviewed, patient verbalized understanding. No acute distress noted or verbalized. Patient discharged home via wheelchair to personal vehicle.

## 2022-02-05 ENCOUNTER — Telehealth: Payer: Self-pay | Admitting: *Deleted

## 2022-02-05 ENCOUNTER — Telehealth: Payer: Self-pay

## 2022-02-05 NOTE — Telephone Encounter (Signed)
LVM for pt to call office to set up appt with Dr. Christian Mate in 2 weeks.

## 2022-02-05 NOTE — Telephone Encounter (Signed)
Patient called and had surgery on 02/02/22 gallbladder and is calling to see if she can get some pain medication sent in to her pharmacy. She uses [facility] on [address].

## 2022-02-06 LAB — SURGICAL PATHOLOGY

## 2022-02-06 NOTE — Telephone Encounter (Signed)
Patient called back and advise her to take tylenol and ibuprofen and if that doesn't help then she can call back in the morning. She has one pain medication left from Dr Izora Ribas.

## 2022-02-07 ENCOUNTER — Other Ambulatory Visit: Payer: Medicare HMO

## 2022-02-12 ENCOUNTER — Observation Stay
Admission: EM | Admit: 2022-02-12 | Discharge: 2022-02-14 | Disposition: A | Discharge status: Home / Self Care | Payer: Medicare HMO | Attending: Internal Medicine | Admitting: Internal Medicine

## 2022-02-12 ENCOUNTER — Encounter: Payer: Self-pay | Admitting: Emergency Medicine

## 2022-02-12 ENCOUNTER — Emergency Department: Payer: Medicare HMO

## 2022-02-12 ENCOUNTER — Ambulatory Visit: Admit: 2022-02-12 | Payer: Medicare HMO | Admitting: Surgery

## 2022-02-12 ENCOUNTER — Other Ambulatory Visit: Payer: Self-pay

## 2022-02-12 DIAGNOSIS — I10 Essential (primary) hypertension: Secondary | ICD-10-CM | POA: Diagnosis not present

## 2022-02-12 DIAGNOSIS — I160 Hypertensive urgency: Secondary | ICD-10-CM | POA: Diagnosis not present

## 2022-02-12 DIAGNOSIS — Z79899 Other long term (current) drug therapy: Secondary | ICD-10-CM | POA: Insufficient documentation

## 2022-02-12 DIAGNOSIS — Z8541 Personal history of malignant neoplasm of cervix uteri: Secondary | ICD-10-CM | POA: Diagnosis not present

## 2022-02-12 DIAGNOSIS — R1110 Vomiting, unspecified: Secondary | ICD-10-CM | POA: Diagnosis not present

## 2022-02-12 DIAGNOSIS — Z96642 Presence of left artificial hip joint: Secondary | ICD-10-CM | POA: Diagnosis not present

## 2022-02-12 DIAGNOSIS — Z87891 Personal history of nicotine dependence: Secondary | ICD-10-CM | POA: Diagnosis not present

## 2022-02-12 DIAGNOSIS — R7989 Other specified abnormal findings of blood chemistry: Secondary | ICD-10-CM | POA: Insufficient documentation

## 2022-02-12 DIAGNOSIS — J449 Chronic obstructive pulmonary disease, unspecified: Secondary | ICD-10-CM | POA: Insufficient documentation

## 2022-02-12 DIAGNOSIS — R1084 Generalized abdominal pain: Secondary | ICD-10-CM | POA: Diagnosis not present

## 2022-02-12 DIAGNOSIS — R112 Nausea with vomiting, unspecified: Principal | ICD-10-CM | POA: Insufficient documentation

## 2022-02-12 DIAGNOSIS — E876 Hypokalemia: Secondary | ICD-10-CM | POA: Insufficient documentation

## 2022-02-12 DIAGNOSIS — N179 Acute kidney failure, unspecified: Secondary | ICD-10-CM | POA: Insufficient documentation

## 2022-02-12 LAB — URINALYSIS, ROUTINE W REFLEX MICROSCOPIC
Bilirubin Urine: NEGATIVE
Glucose, UA: 150 mg/dL — AB
Ketones, ur: NEGATIVE mg/dL
Nitrite: NEGATIVE
Protein, ur: 100 mg/dL — AB
Specific Gravity, Urine: 1.024 (ref 1.005–1.030)
pH: 9 — ABNORMAL HIGH (ref 5.0–8.0)

## 2022-02-12 LAB — COMPREHENSIVE METABOLIC PANEL
ALT: 53 U/L — ABNORMAL HIGH (ref 0–44)
AST: 148 U/L — ABNORMAL HIGH (ref 15–41)
Albumin: 3.6 g/dL (ref 3.5–5.0)
Alkaline Phosphatase: 313 U/L — ABNORMAL HIGH (ref 38–126)
Anion gap: 23 — ABNORMAL HIGH (ref 5–15)
BUN: 12 mg/dL (ref 6–20)
CO2: 29 mmol/L (ref 22–32)
Calcium: 8.5 mg/dL — ABNORMAL LOW (ref 8.9–10.3)
Chloride: 86 mmol/L — ABNORMAL LOW (ref 98–111)
Creatinine, Ser: 1.09 mg/dL — ABNORMAL HIGH (ref 0.44–1.00)
GFR, Estimated: 59 mL/min — ABNORMAL LOW (ref >60)
Glucose, Bld: 227 mg/dL — ABNORMAL HIGH (ref 70–99)
Potassium: 3.1 mmol/L — ABNORMAL LOW (ref 3.5–5.1)
Sodium: 138 mmol/L (ref 135–145)
Total Bilirubin: 4.3 mg/dL — ABNORMAL HIGH (ref 0.3–1.2)
Total Protein: 8 g/dL (ref 6.5–8.1)

## 2022-02-12 LAB — CBC WITH DIFFERENTIAL/PLATELET
Abs Immature Granulocytes: 0.06 10*3/uL (ref 0.00–0.07)
Basophils Absolute: 0 10*3/uL (ref 0.0–0.1)
Basophils Relative: 0 %
Eosinophils Absolute: 0 10*3/uL (ref 0.0–0.5)
Eosinophils Relative: 0 %
HCT: 45.9 % (ref 36.0–46.0)
Hemoglobin: 16.2 g/dL — ABNORMAL HIGH (ref 12.0–15.0)
Immature Granulocytes: 0 %
Lymphocytes Relative: 6 %
Lymphs Abs: 0.9 10*3/uL (ref 0.7–4.0)
MCH: 34.8 pg — ABNORMAL HIGH (ref 26.0–34.0)
MCHC: 35.3 g/dL (ref 30.0–36.0)
MCV: 98.5 fL (ref 80.0–100.0)
Monocytes Absolute: 1.1 10*3/uL — ABNORMAL HIGH (ref 0.1–1.0)
Monocytes Relative: 7 %
Neutro Abs: 12.7 10*3/uL — ABNORMAL HIGH (ref 1.7–7.7)
Neutrophils Relative %: 87 %
Platelets: 283 10*3/uL (ref 150–400)
RBC: 4.66 MIL/uL (ref 3.87–5.11)
RDW: 14.1 % (ref 11.5–15.5)
WBC: 14.8 10*3/uL — ABNORMAL HIGH (ref 4.0–10.5)
nRBC: 0 % (ref 0.0–0.2)

## 2022-02-12 LAB — LIPASE, BLOOD: Lipase: 46 U/L (ref 11–51)

## 2022-02-12 LAB — LACTIC ACID, PLASMA
Lactic Acid, Venous: 3.7 mmol/L (ref 0.5–1.9)
Lactic Acid, Venous: 2.7 mmol/L (ref 0.5–1.9)

## 2022-02-12 LAB — ACETAMINOPHEN LEVEL: Acetaminophen (Tylenol), Serum: <10 ug/mL — ABNORMAL LOW (ref 10–30)

## 2022-02-12 LAB — ETHANOL: Alcohol, Ethyl (B): <10 mg/dL (ref <10)

## 2022-02-12 SURGERY — CHOLECYSTECTOMY, ROBOT-ASSISTED, LAPAROSCOPIC
Anesthesia: General

## 2022-02-12 MED ORDER — CYCLOBENZAPRINE HCL 10 MG PO TABS
10.0000 mg | ORAL_TABLET | Freq: Two times a day (BID) | ORAL | Status: DC
  Administered 2022-02-12 – 2022-02-14 (×4): 10 mg via ORAL
  Filled 2022-02-12 (×5): qty 1

## 2022-02-12 MED ORDER — POTASSIUM CHLORIDE 10 MEQ/100ML IV SOLN
10.0000 meq | INTRAVENOUS | Status: AC
  Administered 2022-02-12 – 2022-02-13 (×4): 10 meq via INTRAVENOUS
  Filled 2022-02-12 (×2): qty 100

## 2022-02-12 MED ORDER — ONDANSETRON HCL 4 MG PO TABS
4.0000 mg | ORAL_TABLET | Freq: Four times a day (QID) | ORAL | Status: DC | PRN
  Administered 2022-02-13: 4 mg via ORAL
  Filled 2022-02-12: qty 1

## 2022-02-12 MED ORDER — ONDANSETRON HCL 4 MG/2ML IJ SOLN
4.0000 mg | Freq: Once | INTRAMUSCULAR | Status: AC
  Administered 2022-02-12: 4 mg via INTRAVENOUS
  Filled 2022-02-12: qty 2

## 2022-02-12 MED ORDER — HYDROMORPHONE HCL 1 MG/ML IJ SOLN
1.0000 mg | INTRAMUSCULAR | Status: DC | PRN
  Administered 2022-02-12 – 2022-02-14 (×5): 1 mg via INTRAVENOUS
  Filled 2022-02-12 (×5): qty 1

## 2022-02-12 MED ORDER — LORAZEPAM 2 MG/ML IJ SOLN
0.5000 mg | Freq: Once | INTRAMUSCULAR | Status: AC
  Administered 2022-02-12: 0.5 mg via INTRAVENOUS
  Filled 2022-02-12: qty 1

## 2022-02-12 MED ORDER — ACETAMINOPHEN 650 MG RE SUPP: 650.0000 mg | Freq: Four times a day (QID) | RECTAL | Status: DC | PRN

## 2022-02-12 MED ORDER — IOHEXOL 300 MG/ML  SOLN
100.0000 mL | Freq: Once | INTRAMUSCULAR | Status: AC | PRN
  Administered 2022-02-12: 100 mL via INTRAVENOUS

## 2022-02-12 MED ORDER — SODIUM CHLORIDE 0.9% FLUSH
3.0000 mL | Freq: Two times a day (BID) | INTRAVENOUS | Status: DC
  Administered 2022-02-12 – 2022-02-13 (×3): 3 mL via INTRAVENOUS

## 2022-02-12 MED ORDER — SODIUM CHLORIDE 0.9 % IV SOLN: Freq: Once | INTRAVENOUS | Status: AC

## 2022-02-12 MED ORDER — SERTRALINE HCL 50 MG PO TABS
100.0000 mg | ORAL_TABLET | Freq: Every day | ORAL | Status: DC
  Administered 2022-02-13 – 2022-02-14 (×2): 100 mg via ORAL
  Filled 2022-02-12 (×2): qty 2

## 2022-02-12 MED ORDER — ENOXAPARIN SODIUM 40 MG/0.4ML IJ SOSY
40.0000 mg | PREFILLED_SYRINGE | INTRAMUSCULAR | Status: DC
  Administered 2022-02-12 – 2022-02-13 (×2): 40 mg via SUBCUTANEOUS
  Filled 2022-02-12 (×2): qty 0.4

## 2022-02-12 MED ORDER — KETOROLAC TROMETHAMINE 15 MG/ML IJ SOLN
15.0000 mg | Freq: Once | INTRAMUSCULAR | Status: AC
  Administered 2022-02-12: 15 mg via INTRAVENOUS
  Filled 2022-02-12: qty 1

## 2022-02-12 MED ORDER — SODIUM CHLORIDE 0.9 % IV BOLUS
1000.0000 mL | Freq: Once | INTRAVENOUS | Status: AC
  Administered 2022-02-12: 1000 mL via INTRAVENOUS

## 2022-02-12 MED ORDER — PANTOPRAZOLE SODIUM 40 MG PO TBEC
40.0000 mg | DELAYED_RELEASE_TABLET | Freq: Every day | ORAL | Status: DC
  Administered 2022-02-12 – 2022-02-14 (×3): 40 mg via ORAL
  Filled 2022-02-12 (×3): qty 1

## 2022-02-12 MED ORDER — GADOBUTROL 1 MMOL/ML IV SOLN
6.0000 mL | Freq: Once | INTRAVENOUS | Status: AC | PRN
  Administered 2022-02-12: 6 mL via INTRAVENOUS

## 2022-02-12 MED ORDER — VALACYCLOVIR HCL 500 MG PO TABS
1000.0000 mg | ORAL_TABLET | Freq: Every day | ORAL | Status: DC
  Administered 2022-02-13 – 2022-02-14 (×2): 1000 mg via ORAL
  Filled 2022-02-12 (×2): qty 2

## 2022-02-12 MED ORDER — ACETAMINOPHEN 325 MG PO TABS: 650.0000 mg | ORAL_TABLET | Freq: Four times a day (QID) | ORAL | Status: DC | PRN

## 2022-02-12 MED ORDER — ONDANSETRON HCL 4 MG/2ML IJ SOLN
4.0000 mg | Freq: Four times a day (QID) | INTRAMUSCULAR | Status: DC | PRN
  Administered 2022-02-13: 4 mg via INTRAVENOUS
  Filled 2022-02-12: qty 2

## 2022-02-12 MED ORDER — MORPHINE SULFATE (PF) 4 MG/ML IV SOLN
4.0000 mg | Freq: Once | INTRAVENOUS | Status: AC
  Administered 2022-02-12: 4 mg via INTRAVENOUS
  Filled 2022-02-12: qty 1

## 2022-02-12 MED ORDER — LACTATED RINGERS IV SOLN: INTRAVENOUS | Status: AC

## 2022-02-12 MED ORDER — POLYETHYLENE GLYCOL 3350 17 G PO PACK: 17.0000 g | PACK | Freq: Every day | ORAL | Status: DC | PRN

## 2022-02-12 MED ORDER — LABETALOL HCL 5 MG/ML IV SOLN
10.0000 mg | INTRAVENOUS | Status: DC | PRN
  Administered 2022-02-12 – 2022-02-13 (×2): 10 mg via INTRAVENOUS
  Filled 2022-02-12 (×2): qty 4

## 2022-02-12 MED ORDER — ALBUTEROL SULFATE (2.5 MG/3ML) 0.083% IN NEBU: 3.0000 mL | INHALATION_SOLUTION | Freq: Four times a day (QID) | RESPIRATORY_TRACT | Status: DC | PRN

## 2022-02-12 NOTE — ED Triage Notes (Signed)
Pt presents to the ED via POV due to pain, vomiting and diarrhea. Pt states she had her gallbladder taken out 10 days ago. Pt A&Ox4. Pt is currently vomiting in triage.

## 2022-02-12 NOTE — ED Provider Notes (Signed)
MRCP is in the for retained stone, discussed with Dr. Hampton Abbot who feels this is not related to surgery, will d/w hospitalist for admission given cont vomiting   Lavonia Drafts, MD 02/12/22 [phone]

## 2022-02-12 NOTE — Assessment & Plan Note (Signed)
Patient has a history of chronically elevated LFTs in the setting of ongoing alcohol use and chronic cholecystitis.  General surgery has evaluated and does not feel elevation is secondary to postop complications.  MRCP with no retained stone.  -Trend LFTs during admission

## 2022-02-12 NOTE — Consult Note (Signed)
Date of Consultation:  02/12/2022  Requesting Physician:  Lavonia Drafts, MD  Reason for Consultation:  Nausea, vomiting.  History of Present Illness: Shirley Evans is a 56 y.o. female s/p robotic assisted cholecystectomy on 02/02/22 with Dr. Christian Mate.  She has history of liver disease, EtOH and drub abuse, COPD, HTN.  She reports having nausea and emesis since 11/16, which progressed to diarrhea on 11/18.  She's been unable to keep much po intake down except fluids.  Also reports upper abdominal pain, that she attributes to the persistent dry heaving.  In the ED, workup showed hypokalemia, hypochloremia, elevated LFTs with tbili 4.3, AST 148, ALT 53, Alk Phos 313, and WBC of 14.8.  She had a CT scan of abdomen/pelvis which showed some post-surgical changes in the RUQ, but no significant fluid.  Given her elevated LFTs, she also had an MRCP which showed also post-op changes, no choledocholithiasis.  Her nausea persisted and she is being admitted to medical team.  Past Medical History: Past Medical History:  Diagnosis Date   Cancer ([place])    cervical   COPD (chronic obstructive pulmonary disease) ([facility])    GERD (gastroesophageal reflux disease)    History of Helicobacter pylori infection    Hypertension    Menopausal state    Osteopenia    Postmenopausal atrophic vaginitis      Past Surgical History: Past Surgical History:  Procedure Laterality Date   BREAST BIOPSY Bilateral    neg   TOTAL HIP ARTHROPLASTY Left 12/22/2020   Procedure: TOTAL HIP ARTHROPLASTY ANTERIOR APPROACH;  Surgeon: Hessie Knows, MD;  Location: [facility] ORS;  Service: Orthopedics;  Laterality: Left;    Home Medications: Prior to Admission medications   Medication Sig Start Date End Date Taking? Authorizing Provider  lidocaine (LIDODERM) 5 % Place onto the skin. 04/01/19  Yes [provider]  ondansetron (ZOFRAN-ODT) 4 MG disintegrating tablet Take 1 tablet (4 mg total) by mouth every 8 (eight) hours as  needed for nausea or vomiting. 01/03/22  Yes Carrie Mew, MD  albuterol (PROVENTIL) (2.5 MG/3ML) 0.083% nebulizer solution Inhale into the lungs. 07/09/19 01/30/22  [provider]  albuterol (VENTOLIN HFA) 108 (90 Base) MCG/ACT inhaler Inhale into the lungs. 07/09/19   [provider]  buPROPion (WELLBUTRIN) 75 MG tablet Take 75 mg by mouth 3 (three) times daily. 06/19/19 12/14/21  [provider]  cholecalciferol (VITAMIN D3) 25 MCG (1000 UNIT) tablet Take 1,000 Units by mouth daily.    [provider]  cyclobenzaprine (FLEXERIL) 10 MG tablet Take 10 mg by mouth 2 (two) times daily. 06/19/19   [provider]  dicyclomine (BENTYL) 10 MG capsule Take 1 capsule (10 mg total) by mouth 3 (three) times daily as needed (abd pain). 12/12/21   Nance Pear, MD  lisinopril (ZESTRIL) 20 MG tablet Take 1 tablet (20 mg total) by mouth daily. 02/04/22   Geradine Girt, DO  loperamide (IMODIUM) 2 MG capsule Take 1 capsule (2 mg total) by mouth as needed for diarrhea or loose stools. 08/12/21   Annita Brod, MD  magnesium gluconate (MAGONATE) 500 MG tablet Take 1 tablet (500 mg total) by mouth 2 (two) times daily. 09/01/21   Cherene Altes, MD  omeprazole (PRILOSEC) 40 MG capsule Take 40 mg by mouth 2 (two) times daily.    [provider]  oxyCODONE-acetaminophen (PERCOCET) 5-325 MG tablet Take 1 tablet by mouth every 4 (four) hours as needed for severe pain. Patient not taking: Reported on 02/12/2022  01/30/22   Meade Maw, MD  rosuvastatin (CRESTOR) 40 MG tablet Take 40 mg by mouth daily. 06/19/19 12/14/21  [provider]  sertraline (ZOLOFT) 100 MG tablet Take 100 mg by mouth daily. 06/19/19   [provider]  valACYclovir (VALTREX) 1000 MG tablet Take 1,000 mg by mouth daily. 11/08/20   [provider]    Allergies: Allergies  Allergen Reactions   Demerol [Meperidine] Nausea Only and Nausea And Vomiting    Codeine Nausea Only    Social History:  reports that she has quit smoking. Her smoking use included cigarettes. She has a 30.00 pack-year smoking history. She has never used smokeless tobacco. She reports that she does not currently use alcohol after a past usage of about 8.0 standard drinks of alcohol per week. She reports current drug use. Drug: Marijuana.   Family History: Family History  Problem Relation Age of Onset   Diabetes Mother    Hypertension Mother    Breast cancer Paternal Aunt 18   Hypertension Father     Review of Systems: Review of Systems  Constitutional:  Negative for chills and fever.  HENT:  Negative for hearing loss.   Respiratory:  Negative for shortness of breath.   Cardiovascular:  Negative for chest pain.  Gastrointestinal:  Positive for abdominal pain, nausea and vomiting. Negative for diarrhea.  Genitourinary:  Negative for dysuria.  Musculoskeletal:  Negative for myalgias.  Skin:  Negative for rash.  Neurological:  Negative for dizziness.  Psychiatric/Behavioral:  Negative for depression.     Physical Exam BP (!) 188/108 (BP Location: Left Arm)   Pulse 88   Temp 98 F (36.7 C) (Oral)   Resp 18   Ht 5' 3" (1.6 m)   Wt 63 kg   SpO2 96%   BMI 24.60 kg/m  CONSTITUTIONAL: No acute distress. HEENT:  Normocephalic, atraumatic, extraocular motion intact. NECK: Trachea is midline, and there is no jugular venous distension. RESPIRATORY:  Normal respiratory effort without pathologic use of accessory muscles. CARDIOVASCULAR: Regular rhythm and rate. GI: The abdomen is soft, non-distended, with soreness to palpation in upper abdomen and mildly over incisions.  Incisions healing well, with minimal ecchymosis, no evidence of infection. No peritonitis. MUSCULOSKELETAL:  Normal muscle strength and tone in all four extremities.  No peripheral edema or cyanosis. SKIN: Skin turgor is normal. There are no pathologic skin lesions.  NEUROLOGIC:  Motor and  sensation is grossly normal.  Cranial nerves are grossly intact. PSYCH:  Alert and oriented to person, place and time. Affect is normal.  Laboratory Analysis: Results for orders placed or performed during the hospital encounter of 02/12/22 (from the past 24 hour(s))  Urinalysis, Routine w reflex microscopic Urine, Clean Catch     Status: Abnormal   Collection Time: 02/12/22 12:23 PM  Result Value Ref Range   Color, Urine YELLOW (A) YELLOW   APPearance HAZY (A) CLEAR   Specific Gravity, Urine 1.024 1.005 - 1.030   pH 9.0 (H) 5.0 - 8.0   Glucose, UA 150 (A) NEGATIVE mg/dL   Hgb urine dipstick SMALL (A) NEGATIVE   Bilirubin Urine NEGATIVE NEGATIVE   Ketones, ur NEGATIVE NEGATIVE mg/dL   Protein, ur 100 (A) NEGATIVE mg/dL   Nitrite NEGATIVE NEGATIVE   Leukocytes,Ua TRACE (A) NEGATIVE   RBC / HPF 0-5 0 - 5 RBC/hpf   WBC, UA 0-5 0 - 5 WBC/hpf   Bacteria, UA RARE (A) NONE SEEN   Squamous Epithelial / LPF 0-5 0 - 5  Mucus PRESENT    Hyaline Casts, UA PRESENT   Acetaminophen level     Status: Abnormal   Collection Time: 02/12/22 12:50 PM  Result Value Ref Range   Acetaminophen (Tylenol), Serum <10 (L) 10 - 30 ug/mL  CBC with Differential     Status: Abnormal   Collection Time: 02/12/22 12:50 PM  Result Value Ref Range   WBC 14.8 (H) 4.0 - 10.5 K/uL   RBC 4.66 3.87 - 5.11 MIL/uL   Hemoglobin 16.2 (H) 12.0 - 15.0 g/dL   HCT 45.9 36.0 - 46.0 %   MCV 98.5 80.0 - 100.0 fL   MCH 34.8 (H) 26.0 - 34.0 pg   MCHC 35.3 30.0 - 36.0 g/dL   RDW 14.1 11.5 - 15.5 %   Platelets 283 150 - 400 K/uL   nRBC 0.0 0.0 - 0.2 %   Neutrophils Relative % 87 %   Neutro Abs 12.7 (H) 1.7 - 7.7 K/uL   Lymphocytes Relative 6 %   Lymphs Abs 0.9 0.7 - 4.0 K/uL   Monocytes Relative 7 %   Monocytes Absolute 1.1 (H) 0.1 - 1.0 K/uL   Eosinophils Relative 0 %   Eosinophils Absolute 0.0 0.0 - 0.5 K/uL   Basophils Relative 0 %   Basophils Absolute 0.0 0.0 - 0.1 K/uL   Immature Granulocytes 0 %   Abs Immature  Granulocytes 0.06 0.00 - 0.07 K/uL  Comprehensive metabolic panel     Status: Abnormal   Collection Time: 02/12/22 12:50 PM  Result Value Ref Range   Sodium 138 135 - 145 mmol/L   Potassium 3.1 (L) 3.5 - 5.1 mmol/L   Chloride 86 (L) 98 - 111 mmol/L   CO2 29 22 - 32 mmol/L   Glucose, Bld 227 (H) 70 - 99 mg/dL   BUN 12 6 - 20 mg/dL   Creatinine, Ser 1.09 (H) 0.44 - 1.00 mg/dL   Calcium 8.5 (L) 8.9 - 10.3 mg/dL   Total Protein 8.0 6.5 - 8.1 g/dL   Albumin 3.6 3.5 - 5.0 g/dL   AST 148 (H) 15 - 41 U/L   ALT 53 (H) 0 - 44 U/L   Alkaline Phosphatase 313 (H) 38 - 126 U/L   Total Bilirubin 4.3 (H) 0.3 - 1.2 mg/dL   GFR, Estimated 59 (L) >60 mL/min   Anion gap 23 (H) 5 - 15  Lipase, blood     Status: None   Collection Time: 02/12/22 12:50 PM  Result Value Ref Range   Lipase 46 11 - 51 U/L    Imaging: MR ABDOMEN MRCP W WO CONTAST  Result Date: 02/12/2022 CLINICAL DATA:  Elevated LFTs. Concern for retained biliary calculus. EXAM: MRI ABDOMEN WITHOUT AND WITH CONTRAST (INCLUDING MRCP) TECHNIQUE: Multiplanar multisequence MR imaging of the abdomen was performed both before and after the administration of intravenous contrast. Heavily T2-weighted images of the biliary and pancreatic ducts were obtained, and three-dimensional MRCP images were rendered by post processing. CONTRAST:  1m GADAVIST GADOBUTROL 1 MMOL/ML IV SOLN COMPARISON:  January 03, 2022. CT of the abdomen and pelvis of November of 2023. FINDINGS: Lower chest: Incidental imaging of the lung bases is unremarkable to the extent evaluated on this abdominal MRI. Hepatobiliary: Mild hepatomegaly and marked hepatic steatosis as on recent imaging. Mild caudate hypertrophy. Post cholecystectomy. Trace fluid in the gallbladder fossa and trace stranding. Small amount of fluid measuring approximately 12 x 3 mm in the gallbladder fossa. Trace fluid adjacent to the inferior RIGHT hemiliver. Biliary tree  with 7 mm caliber of the common bile duct in  the pancreas, 8 mm in the more central common bile duct above the pancreas. Mild intrahepatic biliary duct dilation. No filling defect within the common bile duct. Tapered appearance of the duct distally on high-resolution biliary images. No focal, suspicious hepatic lesion.  Portal vein is patent. Pancreas: Normal intrinsic T1 signal. No ductal dilation or sign of inflammation. No focal lesion. Spleen:  Normal size and contour. Adrenals/Urinary Tract: Adrenal glands are normal. There is symmetric renal enhancement without hydronephrosis, suspicious renal lesion or perinephric stranding. Stomach/Bowel: Visualized portions within the abdomen are unremarkable. Vascular/Lymphatic: No pathologically enlarged lymph nodes identified. No abdominal aortic aneurysm demonstrated. Other:  None Musculoskeletal: No suspicious bone lesions identified. IMPRESSION: 1. Post cholecystectomy. Trace fluid in the gallbladder fossa and trace fluid adjacent to the inferior RIGHT hemiliver, associated with mild stranding in the gallbladder fossa. Findings are nonspecific given recent surgery and the fluid is quite small in terms of overall volume. If there is concern for biliary leak HIDA scan could be helpful for further evaluation. 2. Biliary tree is more dilated than on previous imaging but without filling defect to suggest retained calculus. Correlate with any relief in abdominal symptoms that could indicate recently passed biliary calculus. 3. Mild hepatomegaly and severe hepatic steatosis as on recent imaging. Mild caudate hypertrophy. Patient may be a risk for developing chronic liver disease/cirrhosis and shows caudate hypertrophy which is a morphologic feature that can be associated with these findings. Hepatology follow-up may be warranted. Electronically Signed   By: Zetta Bills M.D.   On: 02/12/2022 17:31   MR 3D Recon At Scanner  Result Date: 02/12/2022 CLINICAL DATA:  Elevated LFTs. Concern for retained biliary  calculus. EXAM: MRI ABDOMEN WITHOUT AND WITH CONTRAST (INCLUDING MRCP) TECHNIQUE: Multiplanar multisequence MR imaging of the abdomen was performed both before and after the administration of intravenous contrast. Heavily T2-weighted images of the biliary and pancreatic ducts were obtained, and three-dimensional MRCP images were rendered by post processing. CONTRAST:  62m GADAVIST GADOBUTROL 1 MMOL/ML IV SOLN COMPARISON:  January 03, 2022. CT of the abdomen and pelvis of November of 2023. FINDINGS: Lower chest: Incidental imaging of the lung bases is unremarkable to the extent evaluated on this abdominal MRI. Hepatobiliary: Mild hepatomegaly and marked hepatic steatosis as on recent imaging. Mild caudate hypertrophy. Post cholecystectomy. Trace fluid in the gallbladder fossa and trace stranding. Small amount of fluid measuring approximately 12 x 3 mm in the gallbladder fossa. Trace fluid adjacent to the inferior RIGHT hemiliver. Biliary tree with 7 mm caliber of the common bile duct in the pancreas, 8 mm in the more central common bile duct above the pancreas. Mild intrahepatic biliary duct dilation. No filling defect within the common bile duct. Tapered appearance of the duct distally on high-resolution biliary images. No focal, suspicious hepatic lesion.  Portal vein is patent. Pancreas: Normal intrinsic T1 signal. No ductal dilation or sign of inflammation. No focal lesion. Spleen:  Normal size and contour. Adrenals/Urinary Tract: Adrenal glands are normal. There is symmetric renal enhancement without hydronephrosis, suspicious renal lesion or perinephric stranding. Stomach/Bowel: Visualized portions within the abdomen are unremarkable. Vascular/Lymphatic: No pathologically enlarged lymph nodes identified. No abdominal aortic aneurysm demonstrated. Other:  None Musculoskeletal: No suspicious bone lesions identified. IMPRESSION: 1. Post cholecystectomy. Trace fluid in the gallbladder fossa and trace fluid adjacent  to the inferior RIGHT hemiliver, associated with mild stranding in the gallbladder fossa. Findings are nonspecific given recent surgery and  the fluid is quite small in terms of overall volume. If there is concern for biliary leak HIDA scan could be helpful for further evaluation. 2. Biliary tree is more dilated than on previous imaging but without filling defect to suggest retained calculus. Correlate with any relief in abdominal symptoms that could indicate recently passed biliary calculus. 3. Mild hepatomegaly and severe hepatic steatosis as on recent imaging. Mild caudate hypertrophy. Patient may be a risk for developing chronic liver disease/cirrhosis and shows caudate hypertrophy which is a morphologic feature that can be associated with these findings. Hepatology follow-up may be warranted. Electronically Signed   By: Zetta Bills M.D.   On: 02/12/2022 17:31   CT Abdomen Pelvis W Contrast  Result Date: 02/12/2022 CLINICAL DATA:  Acute generalized abdominal pain. Status post cholecystectomy. EXAM: CT ABDOMEN AND PELVIS WITH CONTRAST TECHNIQUE: Multidetector CT imaging of the abdomen and pelvis was performed using the standard protocol following bolus administration of intravenous contrast. RADIATION DOSE REDUCTION: This exam was performed according to the departmental dose-optimization program which includes automated exposure control, adjustment of the mA and/or kV according to patient size and/or use of iterative reconstruction technique. CONTRAST:  190m OMNIPAQUE IOHEXOL 300 MG/ML  SOLN COMPARISON:  January 03, 2022. FINDINGS: Lower chest: No acute abnormality. Hepatobiliary: Hepatic steatosis. Status post cholecystectomy. No biliary dilatation is noted. Pancreas: Unremarkable. No pancreatic ductal dilatation or surrounding inflammatory changes. Spleen: Normal in size without focal abnormality. Adrenals/Urinary Tract: Adrenal glands appear normal. Nonobstructive left renal calculus is noted. No  hydronephrosis or renal obstruction is noted. Urinary bladder is unremarkable. Stomach/Bowel: Stomach is within normal limits. Appendix appears normal. No evidence of bowel wall thickening, distention, or inflammatory changes. Vascular/Lymphatic: Aortic atherosclerosis. No enlarged abdominal or pelvic lymph nodes. Reproductive: Uterus and bilateral adnexa are unremarkable. Other: No abdominal wall hernia or abnormality. No abdominopelvic ascites. Musculoskeletal: Status post left total hip arthroplasty. Stable old T10 and T12 compression fractures. No acute osseous abnormality is noted. IMPRESSION: Hepatic steatosis. Nonobstructive left renal calculus. No hydronephrosis or renal obstruction is noted. Aortic Atherosclerosis (ICD10-I70.0). Electronically Signed   By: JMarijo ConceptionM.D.   On: 02/12/2022 13:55    Assessment and Plan: This is a 56y.o. female s/p robotic cholecystectomy, with nausea/vomiting.  --Discussed with the patient the findings on her CT and MRCP.  There're only post-surgical changes on both images, and no evidence of choledocholithiasis or biloma.  The patient does have liver disease and with her dehydration from emesis, may be contributing to some elevation over more recent labs. Exam is not showing peritonitis and overall not significantly tender.  Do not think there're any surgical needs at this point.  OK with diet as tolerated from surgical standpoint, IV fluids, nausea/pain medication. --Will discuss with Dr. RChristian Matein AM.  I spent 45 minutes dedicated to the care of this patient on the date of this encounter to include pre-visit review of records, face-to-face time with the patient discussing diagnosis and management, and any post-visit coordination of care.   JMelvyn Neth MD [facility] Pg:  [phone]

## 2022-02-12 NOTE — Assessment & Plan Note (Addendum)
Patient presenting with 4-day history of intractable nausea and vomiting of unknown etiology.  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  MRCP was obtained with no evidence of retained stone and general surgery was consulted; they do not feel current presentation is related to recent cholecystectomy.  Differential includes viral gastroenteritis versus gastroparesis.  Differential also includes withdrawal, although patient denies any substance use recently, she does have a history of AUD and cocaine use disorder.  - IV fluid resuscitation - Zofran 4 mg every 6 hours - Trial one-time dose of Ativan - Consider addition of Reglan if nonresponsive - Urine drug screen pending

## 2022-02-12 NOTE — Assessment & Plan Note (Signed)
From GI losses  -IV potassium supplementation

## 2022-02-12 NOTE — Assessment & Plan Note (Signed)
Blood pressure markedly on admission although patient is not experiencing any symptoms other than nausea and vomiting.  Unlikely that vomiting is secondary to hypertensive emergency.  Given she is unable to tolerate p.o. medications at this time, will need to use IV and restart her home medications when she can tolerate p.o. intake.  -IV labetalol as needed - Restart home medications when able to

## 2022-02-12 NOTE — ED Notes (Signed)
Dr Charleen Kirks notifies this RN aware of Lactic acid result, will order another bolis, also PRN BP meds.

## 2022-02-12 NOTE — ED Provider Notes (Signed)
[facility] Provider Note    Event Date/Time   First MD Initiated Contact with Patient 02/12/22 1207     (approximate)   History   Emesis   HPI  Shirley Evans is a 56 y.o. female with a past medical history of COPD, hypertension GERD, recent robotic assisted laparoscopic cholecystectomy with indocyanine green ductal imaging on 02/02/2022.  She reports that she has had pain since then, but beginning on Thursday she developed nausea and vomiting.  She reports that she has been taking the Tylenol as prescribed, but feels that her symptoms are worsening.  She reports that she has not been able to have anything by mouth since Thursday.  She denies fevers.  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   Chronic diarrhea 08/28/2021   Thrombocytopenia ([place]) 08/27/2021   Hypophosphatemia 08/27/2021   AKI (acute kidney injury) ([place]) 08/25/2021   Vertebral fracture, osteoporotic, initial encounter ([place]) 08/25/2021   Acute gastroenteritis 08/25/2021   Depression 08/25/2021   Sepsis ([place]) 08/25/2021   Overweight (BMI 25.0-29.9) 08/11/2021   Intractable nausea and vomiting 08/10/2021   Hypertensive urgency 08/10/2021   Dyslipidemia 08/10/2021   GERD (gastroesophageal reflux disease) 08/10/2021   Alcohol abuse 08/10/2021   Elevated liver function tests 08/10/2021   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/22/2020   Acute alcohol intoxication delirium with mild use disorder ([place]) 12/22/2020   Cocaine abuse ([place]) 12/22/2020   Closed fracture of left hip ([place]) 63/03/6008   Alcoholic intoxication without complication ([place]) 93/23/5573   Hypomagnesemia 12/21/2020   Hypokalemia 12/21/2020   Hypertension           Physical Exam   Triage Vital Signs: ED Triage Vitals  Enc Vitals Group     BP 02/12/22 1204 (!) 176/146     Pulse Rate 02/12/22 1204 (!) 136     Resp  02/12/22 1204 20     Temp 02/12/22 1204 97.7 F (36.5 C)     Temp Source 02/12/22 1204 Oral     SpO2 02/12/22 1204 96 %     Weight 02/12/22 1205 138 lb 14.2 oz (63 kg)     Height 02/12/22 1205 '5\' 3"'$  (1.6 m)     Head Circumference --      Peak Flow --      Pain Score 02/12/22 1205 10     Pain Loc --      Pain Edu? --      Excl. in [place]? --     Most recent vital signs: Vitals:   02/12/22 1409 02/12/22 1508  BP:  (!) 199/118  Pulse: 98 90  Resp:  18  Temp:  98 F (36.7 C)  SpO2: 95% 95%    Physical Exam Vitals and nursing note reviewed.  Constitutional:      General: Awake and alert. No acute distress.    Appearance: Normal appearance. The patient is normal weight.  HENT:     Head: Normocephalic and atraumatic.     Mouth: Mucous membranes are moist.  Eyes:     General: PERRL. Normal EOMs        Right eye: No discharge.        Left eye: No discharge.     Conjunctiva/sclera: Conjunctivae normal.  Cardiovascular:     Rate and Rhythm: Tachycardic rate and regular rhythm.     Pulses: Normal pulses.     Heart sounds:  Normal heart sounds Pulmonary:     Effort: Pulmonary effort is normal. No respiratory distress.     Breath sounds: Normal breath sounds.  Abdominal:     Abdomen is soft. There is no abdominal tenderness. No rebound or guarding. No distention.  Well-healed surgical scars noted to abdomen, no surrounding erythema, no wound dehiscence or drainage. Musculoskeletal:        General: No swelling. Normal range of motion.     Cervical back: Normal range of motion and neck supple.  Skin:    General: Skin is warm and dry.     Capillary Refill: Capillary refill takes less than 2 seconds.     Findings: No rash.  Neurological:     Mental Status: The patient is awake and alert.      ED Results / Procedures / Treatments   Labs (all labs ordered are listed, but only abnormal results are displayed) Labs Reviewed  ACETAMINOPHEN LEVEL - Abnormal; Notable for the  following components:      Result Value   Acetaminophen (Tylenol), Serum <10 (*)    All other components within normal limits  CBC WITH DIFFERENTIAL/PLATELET - Abnormal; Notable for the following components:   WBC 14.8 (*)    Hemoglobin 16.2 (*)    MCH 34.8 (*)    Neutro Abs 12.7 (*)    Monocytes Absolute 1.1 (*)    All other components within normal limits  COMPREHENSIVE METABOLIC PANEL - Abnormal; Notable for the following components:   Potassium 3.1 (*)    Chloride 86 (*)    Glucose, Bld 227 (*)    Creatinine, Ser 1.09 (*)    Calcium 8.5 (*)    AST 148 (*)    ALT 53 (*)    Alkaline Phosphatase 313 (*)    Total Bilirubin 4.3 (*)    GFR, Estimated 59 (*)    Anion gap 23 (*)    All other components within normal limits  URINALYSIS, ROUTINE W REFLEX MICROSCOPIC - Abnormal; Notable for the following components:   Color, Urine YELLOW (*)    APPearance HAZY (*)    pH 9.0 (*)    Glucose, UA 150 (*)    Hgb urine dipstick SMALL (*)    Protein, ur 100 (*)    Leukocytes,Ua TRACE (*)    Bacteria, UA RARE (*)    All other components within normal limits  LIPASE, BLOOD     EKG     RADIOLOGY I independently reviewed and interpreted imaging and agree with radiologists findings.     PROCEDURES:  Critical Care performed:   Procedures   MEDICATIONS ORDERED IN ED: Medications  sodium chloride 0.9 % bolus 1,000 mL (0 mLs Intravenous Stopped 02/12/22 1432)  ondansetron (ZOFRAN) injection 4 mg (4 mg Intravenous Given 02/12/22 1254)  iohexol (OMNIPAQUE) 300 MG/ML solution 100 mL (100 mLs Intravenous Contrast Given 02/12/22 1335)  ketorolac (TORADOL) 15 MG/ML injection 15 mg (15 mg Intravenous Given 02/12/22 1407)  morphine (PF) 4 MG/ML injection 4 mg (4 mg Intravenous Given 02/12/22 1429)  ondansetron (ZOFRAN) injection 4 mg (4 mg Intravenous Given 02/12/22 1518)     IMPRESSION / MDM / ASSESSMENT AND PLAN / ED COURSE  I reviewed the triage vital signs and the nursing  notes.   Differential diagnosis includes, but is not limited to, postsurgical pain, gastroparesis, abscess, perforation.  Patient is awake and alert, tachycardic to 136 on arrival, though normotensive and afebrile.  She appears to be uncomfortable.  Her abdomen  is tender diffusely.  Labs obtained reveal elevated LFTs, worse than previous including elevated bilirubin.  Contained for retained stone postoperatively.  CT scan obtained initially demonstrates no acute surgical complication.  It did reveal a nonobstructive left renal calculus. UA does not appear to be overtly infected.  Discussed findings with patient, she agrees to undergo MRCP.   Patient passed off to oncoming provider, Dr. Corky Downs, awaiting MRCP and final disposition.  Patient was discussed with Dr. Karma Greaser who agrees with assessment and plan.  Patient's presentation is most consistent with acute presentation with potential threat to life or bodily function.    FINAL CLINICAL IMPRESSION(S) / ED DIAGNOSES   Final diagnoses:  Generalized abdominal pain  Nausea and vomiting, unspecified vomiting type  LFTs abnormal     Rx / DC Orders   ED Discharge Orders     None        Note:  This document was prepared using Dragon voice recognition software and may include unintentional dictation errors.   Emeline Gins 02/12/22 1537    Hinda Kehr, MD 02/12/22 [phone]

## 2022-02-12 NOTE — Assessment & Plan Note (Signed)
In the setting of vomiting and dehydration.  -IV fluid resuscitation - Repeat BMP in the a.m.

## 2022-02-12 NOTE — H&P (Signed)
History and Physical    Patient: Shirley Evans BWI:[id number] DOB: 1965/05/27 DOA: 02/12/2022 DOS: the patient was seen and examined on 02/12/2022 PCP: Romualdo Bolk, FNP  Patient coming from: Home  Chief Complaint:  Chief Complaint  Patient presents with   Emesis   HPI: Shirley Evans is a 56 y.o. female with medical history significant of chronic calculus cholecystitis s/p lap chole on 11/10, hypertension, COPD, who presents to the ED with nausea and vomiting.  Ms. Schoenberger states that after her cholecystectomy, she has been experiencing generalized abdominal pain.  She followed up with general surgery and her PCP for this pain.  On 11/16, she began to experience sudden onset nausea with vomiting.  Since then, her abdominal pain has increased in nature.  Due to severe persistent vomiting, she has been unable to tolerate any p.o. intake.  Emesis has been watery in nature with no black or red.  She denies any fever, chills, chest pain, shortness of breath, diarrhea, lower extremity swelling.  She denies any recent alcohol or drug use.  Per chart review, patient presented on 11/10 with intractable nausea and vomiting, found to have chronic calculus cholecystitis and underwent robotic assistant lap chole.Hospitalization was complicated by acute kidney injury and  hypertensive urgency.  ED course: On arrival to the ED, patient was hypertensive at 176/46 with heart rate of 136.  She was afebrile at 97.7.  Initial blood work demonstrated elevated creatinine 1.09, anion gap of 23, alkaline phosphatase at 313, AST 148, ALT 53, and total bilirubin of 4.3.  WBC elevated 14.8.  MRCP was completed that did not show any retained stone.  General surgery consulted; does not feel current presentation is due to recent cholecystectomy.  Due to intractable vomiting, [facility] contacted for admission.  Review of Systems: As mentioned in the history of present illness. All other systems reviewed and are  negative.  Past Medical History:  Diagnosis Date   Cancer ([place])    cervical   COPD (chronic obstructive pulmonary disease) ([facility])    GERD (gastroesophageal reflux disease)    History of Helicobacter pylori infection    Hypertension    Menopausal state    Osteopenia    Postmenopausal atrophic vaginitis    Past Surgical History:  Procedure Laterality Date   BREAST BIOPSY Bilateral    neg   TOTAL HIP ARTHROPLASTY Left 12/22/2020   Procedure: TOTAL HIP ARTHROPLASTY ANTERIOR APPROACH;  Surgeon: Hessie Knows, MD;  Location: [facility] ORS;  Service: Orthopedics;  Laterality: Left;   Social History:  reports that she has quit smoking. Her smoking use included cigarettes. She has a 30.00 pack-year smoking history. She has never used smokeless tobacco. She reports that she does not currently use alcohol after a past usage of about 8.0 standard drinks of alcohol per week. She reports current drug use. Drug: Marijuana.  Allergies  Allergen Reactions   Demerol [Meperidine] Nausea Only and Nausea And Vomiting   Codeine Nausea Only    Family History  Problem Relation Age of Onset   Diabetes Mother    Hypertension Mother    Breast cancer Paternal Aunt 52   Hypertension Father     Prior to Admission medications   Medication Sig Start Date End Date Taking? Authorizing Provider  lidocaine (LIDODERM) 5 % Place onto the skin. 04/01/19  Yes [provider]  ondansetron (ZOFRAN-ODT) 4 MG disintegrating tablet Take 1 tablet (4 mg total) by mouth every 8 (eight) hours as needed for nausea or vomiting.  01/03/22  Yes Carrie Mew, MD  albuterol (PROVENTIL) (2.5 MG/3ML) 0.083% nebulizer solution Inhale into the lungs. 07/09/19 01/30/22  [provider]  albuterol (VENTOLIN HFA) 108 (90 Base) MCG/ACT inhaler Inhale into the lungs. 07/09/19   [provider]  buPROPion (WELLBUTRIN) 75 MG tablet Take 75 mg by mouth 3 (three) times daily. 06/19/19 12/14/21  [provider]   cholecalciferol (VITAMIN D3) 25 MCG (1000 UNIT) tablet Take 1,000 Units by mouth daily.    [provider]  cyclobenzaprine (FLEXERIL) 10 MG tablet Take 10 mg by mouth 2 (two) times daily. 06/19/19   [provider]  dicyclomine (BENTYL) 10 MG capsule Take 1 capsule (10 mg total) by mouth 3 (three) times daily as needed (abd pain). 12/12/21   Nance Pear, MD  lisinopril (ZESTRIL) 20 MG tablet Take 1 tablet (20 mg total) by mouth daily. 02/04/22   Geradine Girt, DO  loperamide (IMODIUM) 2 MG capsule Take 1 capsule (2 mg total) by mouth as needed for diarrhea or loose stools. 08/12/21   Annita Brod, MD  magnesium gluconate (MAGONATE) 500 MG tablet Take 1 tablet (500 mg total) by mouth 2 (two) times daily. 09/01/21   Cherene Altes, MD  omeprazole (PRILOSEC) 40 MG capsule Take 40 mg by mouth 2 (two) times daily.    [provider]  oxyCODONE-acetaminophen (PERCOCET) 5-325 MG tablet Take 1 tablet by mouth every 4 (four) hours as needed for severe pain. Patient not taking: Reported on 02/12/2022 01/30/22   Meade Maw, MD  rosuvastatin (CRESTOR) 40 MG tablet Take 40 mg by mouth daily. 06/19/19 12/14/21  [provider]  sertraline (ZOLOFT) 100 MG tablet Take 100 mg by mouth daily. 06/19/19   [provider]  valACYclovir (VALTREX) 1000 MG tablet Take 1,000 mg by mouth daily. 11/08/20   [provider]    Physical Exam: Vitals:   02/12/22 1409 02/12/22 1508 02/12/22 1815 02/12/22 2019  BP:  (!) 199/118 (!) 188/108 (!) 209/136  Pulse: 98 90 88 98  Resp:  18 18 (!) 22  Temp:  98 F (36.7 C)  98.1 F (36.7 C)  TempSrc:  Oral    SpO2: 95% 95% 96% 95%  Weight:      Height:       Physical Exam Vitals and nursing note reviewed.  Constitutional:      General: She is not in acute distress.    Appearance: She is normal weight. She is not toxic-appearing.  HENT:     Head: Normocephalic and atraumatic.     Mouth/Throat:      Mouth: Mucous membranes are moist.     Pharynx: Oropharynx is clear.  Eyes:     Conjunctiva/sclera: Conjunctivae normal.     Pupils: Pupils are equal, round, and reactive to light.  Cardiovascular:     Rate and Rhythm: Normal rate and regular rhythm.     Heart sounds: No murmur heard.    No gallop.  Pulmonary:     Effort: Pulmonary effort is normal. No respiratory distress.     Breath sounds: Normal breath sounds. No stridor. No wheezing, rhonchi or rales.  Abdominal:     General: Bowel sounds are normal. There is no distension.     Palpations: Abdomen is soft.     Tenderness: There is abdominal tenderness (Diffuse throughout, but worse on left upper and lower quadrants.). There is no guarding.  Musculoskeletal:     Cervical back: Neck supple.  Right lower leg: No edema.     Left lower leg: No edema.  Skin:    General: Skin is warm and dry.  Neurological:     General: No focal deficit present.     Mental Status: She is alert and oriented to person, place, and time. Mental status is at baseline.  Psychiatric:        Mood and Affect: Mood normal.        Behavior: Behavior normal.    Data Reviewed: CBC with WBC of 14.8, hemoglobin of 16.2, and platelets of 283.CMP with potassium 3.1, chloride of 86, glucose of 227, creatinine of 1.09, AST 148, ALT 53, alkaline phosphatase 313, total bilirubin 4.3, with GFR 59 and anion gap of 23.  Urinalysis with elevated pH, glucosuria, hematuria, proteinuria, and rare bacteria.  Lipase within normal limits.  Lactic acid elevated at 3.7.  Ethanol levels normal.  EKG personally reviewed.  Sinus rhythm with rate of 128.  Diffuse ST depression noted in leads II, V4, V5, V6.  Compared to prior EKG, appears unchanged.  MR ABDOMEN MRCP W WO CONTAST  Result Date: 02/12/2022 CLINICAL DATA:  Elevated LFTs. Concern for retained biliary calculus. EXAM: MRI ABDOMEN WITHOUT AND WITH CONTRAST (INCLUDING MRCP) TECHNIQUE: Multiplanar multisequence MR imaging  of the abdomen was performed both before and after the administration of intravenous contrast. Heavily T2-weighted images of the biliary and pancreatic ducts were obtained, and three-dimensional MRCP images were rendered by post processing. CONTRAST:  56m GADAVIST GADOBUTROL 1 MMOL/ML IV SOLN COMPARISON:  January 03, 2022. CT of the abdomen and pelvis of November of 2023. FINDINGS: Lower chest: Incidental imaging of the lung bases is unremarkable to the extent evaluated on this abdominal MRI. Hepatobiliary: Mild hepatomegaly and marked hepatic steatosis as on recent imaging. Mild caudate hypertrophy. Post cholecystectomy. Trace fluid in the gallbladder fossa and trace stranding. Small amount of fluid measuring approximately 12 x 3 mm in the gallbladder fossa. Trace fluid adjacent to the inferior RIGHT hemiliver. Biliary tree with 7 mm caliber of the common bile duct in the pancreas, 8 mm in the more central common bile duct above the pancreas. Mild intrahepatic biliary duct dilation. No filling defect within the common bile duct. Tapered appearance of the duct distally on high-resolution biliary images. No focal, suspicious hepatic lesion.  Portal vein is patent. Pancreas: Normal intrinsic T1 signal. No ductal dilation or sign of inflammation. No focal lesion. Spleen:  Normal size and contour. Adrenals/Urinary Tract: Adrenal glands are normal. There is symmetric renal enhancement without hydronephrosis, suspicious renal lesion or perinephric stranding. Stomach/Bowel: Visualized portions within the abdomen are unremarkable. Vascular/Lymphatic: No pathologically enlarged lymph nodes identified. No abdominal aortic aneurysm demonstrated. Other:  None Musculoskeletal: No suspicious bone lesions identified. IMPRESSION: 1. Post cholecystectomy. Trace fluid in the gallbladder fossa and trace fluid adjacent to the inferior RIGHT hemiliver, associated with mild stranding in the gallbladder fossa. Findings are nonspecific  given recent surgery and the fluid is quite small in terms of overall volume. If there is concern for biliary leak HIDA scan could be helpful for further evaluation. 2. Biliary tree is more dilated than on previous imaging but without filling defect to suggest retained calculus. Correlate with any relief in abdominal symptoms that could indicate recently passed biliary calculus. 3. Mild hepatomegaly and severe hepatic steatosis as on recent imaging. Mild caudate hypertrophy. Patient may be a risk for developing chronic liver disease/cirrhosis and shows caudate hypertrophy which is a morphologic feature that can be associated  with these findings. Hepatology follow-up may be warranted. Electronically Signed   By: Zetta Bills M.D.   On: 02/12/2022 17:31   MR 3D Recon At Scanner  Result Date: 02/12/2022 CLINICAL DATA:  Elevated LFTs. Concern for retained biliary calculus. EXAM: MRI ABDOMEN WITHOUT AND WITH CONTRAST (INCLUDING MRCP) TECHNIQUE: Multiplanar multisequence MR imaging of the abdomen was performed both before and after the administration of intravenous contrast. Heavily T2-weighted images of the biliary and pancreatic ducts were obtained, and three-dimensional MRCP images were rendered by post processing. CONTRAST:  70m GADAVIST GADOBUTROL 1 MMOL/ML IV SOLN COMPARISON:  January 03, 2022. CT of the abdomen and pelvis of November of 2023. FINDINGS: Lower chest: Incidental imaging of the lung bases is unremarkable to the extent evaluated on this abdominal MRI. Hepatobiliary: Mild hepatomegaly and marked hepatic steatosis as on recent imaging. Mild caudate hypertrophy. Post cholecystectomy. Trace fluid in the gallbladder fossa and trace stranding. Small amount of fluid measuring approximately 12 x 3 mm in the gallbladder fossa. Trace fluid adjacent to the inferior RIGHT hemiliver. Biliary tree with 7 mm caliber of the common bile duct in the pancreas, 8 mm in the more central common bile duct above the  pancreas. Mild intrahepatic biliary duct dilation. No filling defect within the common bile duct. Tapered appearance of the duct distally on high-resolution biliary images. No focal, suspicious hepatic lesion.  Portal vein is patent. Pancreas: Normal intrinsic T1 signal. No ductal dilation or sign of inflammation. No focal lesion. Spleen:  Normal size and contour. Adrenals/Urinary Tract: Adrenal glands are normal. There is symmetric renal enhancement without hydronephrosis, suspicious renal lesion or perinephric stranding. Stomach/Bowel: Visualized portions within the abdomen are unremarkable. Vascular/Lymphatic: No pathologically enlarged lymph nodes identified. No abdominal aortic aneurysm demonstrated. Other:  None Musculoskeletal: No suspicious bone lesions identified. IMPRESSION: 1. Post cholecystectomy. Trace fluid in the gallbladder fossa and trace fluid adjacent to the inferior RIGHT hemiliver, associated with mild stranding in the gallbladder fossa. Findings are nonspecific given recent surgery and the fluid is quite small in terms of overall volume. If there is concern for biliary leak HIDA scan could be helpful for further evaluation. 2. Biliary tree is more dilated than on previous imaging but without filling defect to suggest retained calculus. Correlate with any relief in abdominal symptoms that could indicate recently passed biliary calculus. 3. Mild hepatomegaly and severe hepatic steatosis as on recent imaging. Mild caudate hypertrophy. Patient may be a risk for developing chronic liver disease/cirrhosis and shows caudate hypertrophy which is a morphologic feature that can be associated with these findings. Hepatology follow-up may be warranted. Electronically Signed   By: GZetta BillsM.D.   On: 02/12/2022 17:31   CT Abdomen Pelvis W Contrast  Result Date: 02/12/2022 CLINICAL DATA:  Acute generalized abdominal pain. Status post cholecystectomy. EXAM: CT ABDOMEN AND PELVIS WITH CONTRAST  TECHNIQUE: Multidetector CT imaging of the abdomen and pelvis was performed using the standard protocol following bolus administration of intravenous contrast. RADIATION DOSE REDUCTION: This exam was performed according to the departmental dose-optimization program which includes automated exposure control, adjustment of the mA and/or kV according to patient size and/or use of iterative reconstruction technique. CONTRAST:  1076mOMNIPAQUE IOHEXOL 300 MG/ML  SOLN COMPARISON:  January 03, 2022. FINDINGS: Lower chest: No acute abnormality. Hepatobiliary: Hepatic steatosis. Status post cholecystectomy. No biliary dilatation is noted. Pancreas: Unremarkable. No pancreatic ductal dilatation or surrounding inflammatory changes. Spleen: Normal in size without focal abnormality. Adrenals/Urinary Tract: Adrenal glands appear normal. Nonobstructive left  renal calculus is noted. No hydronephrosis or renal obstruction is noted. Urinary bladder is unremarkable. Stomach/Bowel: Stomach is within normal limits. Appendix appears normal. No evidence of bowel wall thickening, distention, or inflammatory changes. Vascular/Lymphatic: Aortic atherosclerosis. No enlarged abdominal or pelvic lymph nodes. Reproductive: Uterus and bilateral adnexa are unremarkable. Other: No abdominal wall hernia or abnormality. No abdominopelvic ascites. Musculoskeletal: Status post left total hip arthroplasty. Stable old T10 and T12 compression fractures. No acute osseous abnormality is noted. IMPRESSION: Hepatic steatosis. Nonobstructive left renal calculus. No hydronephrosis or renal obstruction is noted. Aortic Atherosclerosis (ICD10-I70.0). Electronically Signed   By: Marijo Conception M.D.   On: 02/12/2022 13:55    There are no new results to review at this time.  Assessment and Plan: * Intractable vomiting Patient presenting with 4-day history of intractable nausea and vomiting of unknown etiology.  She was previously hospitalized for the same  symptoms found to have chronic calculus cholecystitis.  MRCP was obtained with no evidence of retained stone and general surgery was consulted; they do not feel current presentation is related to recent cholecystectomy.  Differential includes viral gastroenteritis versus gastroparesis.  Differential also includes withdrawal, although patient denies any substance use recently, she does have a history of AUD and cocaine use disorder.  - IV fluid resuscitation - Zofran 4 mg every 6 hours - Trial one-time dose of Ativan - Consider addition of Reglan if nonresponsive - Urine drug screen pending  Hypertensive urgency Blood pressure markedly on admission although patient is not experiencing any symptoms other than nausea and vomiting.  Unlikely that vomiting is secondary to hypertensive emergency.  Given she is unable to tolerate p.o. medications at this time, will need to use IV and restart her home medications when she can tolerate p.o. intake.  -IV labetalol as needed - Restart home medications when able to  AKI (acute kidney injury) ([place]) In the setting of vomiting and dehydration.  -IV fluid resuscitation - Repeat BMP in the a.m.  Elevated liver function tests Patient has a history of chronically elevated LFTs in the setting of ongoing alcohol use and chronic cholecystitis.  General surgery has evaluated and does not feel elevation is secondary to postop complications.  MRCP with no retained stone.  -Trend LFTs during admission  Hypokalemia From GI losses  -IV potassium supplementation  Advance Care Planning:   Code Status: Full Code  Consults: General surgery  Family Communication: No family at bedside  Severity of Illness: The appropriate patient status for this patient is OBSERVATION. Observation status is judged to be reasonable and necessary in order to provide the required intensity of service to ensure the patient's safety. The patient's presenting symptoms, physical exam  findings, and initial radiographic and laboratory data in the context of their medical condition is felt to place them at decreased risk for further clinical deterioration. Furthermore, it is anticipated that the patient will be medically stable for discharge from the hospital within 2 midnights of admission.   Author: Jose Persia, MD 02/12/2022 9:16 PM  For on call review [email or web address].

## 2022-02-13 ENCOUNTER — Encounter: Payer: Self-pay | Admitting: Internal Medicine

## 2022-02-13 DIAGNOSIS — R1110 Vomiting, unspecified: Secondary | ICD-10-CM | POA: Diagnosis not present

## 2022-02-13 DIAGNOSIS — E876 Hypokalemia: Secondary | ICD-10-CM | POA: Diagnosis not present

## 2022-02-13 DIAGNOSIS — I160 Hypertensive urgency: Secondary | ICD-10-CM | POA: Diagnosis not present

## 2022-02-13 DIAGNOSIS — R112 Nausea with vomiting, unspecified: Secondary | ICD-10-CM | POA: Diagnosis not present

## 2022-02-13 LAB — CBC
HCT: 36.9 % (ref 36.0–46.0)
Hemoglobin: 12.8 g/dL (ref 12.0–15.0)
MCH: 34.7 pg — ABNORMAL HIGH (ref 26.0–34.0)
MCHC: 34.7 g/dL (ref 30.0–36.0)
MCV: 100 fL (ref 80.0–100.0)
Platelets: 204 10*3/uL (ref 150–400)
RBC: 3.69 MIL/uL — ABNORMAL LOW (ref 3.87–5.11)
RDW: 14 % (ref 11.5–15.5)
WBC: 12.9 10*3/uL — ABNORMAL HIGH (ref 4.0–10.5)
nRBC: 0 % (ref 0.0–0.2)

## 2022-02-13 LAB — URINE DRUG SCREEN, QUALITATIVE (ARMC ONLY)
Amphetamines, Ur Screen: NOT DETECTED
Barbiturates, Ur Screen: NOT DETECTED
Benzodiazepine, Ur Scrn: NOT DETECTED
Cannabinoid 50 Ng, Ur ~~LOC~~: NOT DETECTED
Cocaine Metabolite,Ur ~~LOC~~: NOT DETECTED
MDMA (Ecstasy)Ur Screen: NOT DETECTED
Methadone Scn, Ur: NOT DETECTED
Opiate, Ur Screen: POSITIVE — AB
Phencyclidine (PCP) Ur S: NOT DETECTED
Tricyclic, Ur Screen: NOT DETECTED

## 2022-02-13 LAB — COMPREHENSIVE METABOLIC PANEL
ALT: 45 U/L — ABNORMAL HIGH (ref 0–44)
AST: 154 U/L — ABNORMAL HIGH (ref 15–41)
Albumin: 3 g/dL — ABNORMAL LOW (ref 3.5–5.0)
Alkaline Phosphatase: 232 U/L — ABNORMAL HIGH (ref 38–126)
Anion gap: 12 (ref 5–15)
BUN: 15 mg/dL (ref 6–20)
CO2: 30 mmol/L (ref 22–32)
Calcium: 7 mg/dL — ABNORMAL LOW (ref 8.9–10.3)
Chloride: 92 mmol/L — ABNORMAL LOW (ref 98–111)
Creatinine, Ser: 0.88 mg/dL (ref 0.44–1.00)
GFR, Estimated: >60 mL/min (ref >60)
Glucose, Bld: 109 mg/dL — ABNORMAL HIGH (ref 70–99)
Potassium: 3.5 mmol/L (ref 3.5–5.1)
Sodium: 134 mmol/L — ABNORMAL LOW (ref 135–145)
Total Bilirubin: 4.1 mg/dL — ABNORMAL HIGH (ref 0.3–1.2)
Total Protein: 6.5 g/dL (ref 6.5–8.1)

## 2022-02-13 LAB — HEMOGLOBIN A1C
Hgb A1c MFr Bld: 5.1 % (ref 4.8–5.6)
Mean Plasma Glucose: 99.67 mg/dL

## 2022-02-13 LAB — LACTIC ACID, PLASMA: Lactic Acid, Venous: 1.5 mmol/L (ref 0.5–1.9)

## 2022-02-13 LAB — PHOSPHORUS: Phosphorus: 4.7 mg/dL — ABNORMAL HIGH (ref 2.5–4.6)

## 2022-02-13 LAB — MAGNESIUM: Magnesium: 0.7 mg/dL — CL (ref 1.7–2.4)

## 2022-02-13 MED ORDER — ACETAMINOPHEN 650 MG RE SUPP: 650.0000 mg | Freq: Four times a day (QID) | RECTAL | Status: DC | PRN

## 2022-02-13 MED ORDER — ACETAMINOPHEN 325 MG PO TABS
650.0000 mg | ORAL_TABLET | Freq: Four times a day (QID) | ORAL | Status: DC | PRN
  Administered 2022-02-13: 650 mg via ORAL
  Filled 2022-02-13: qty 2

## 2022-02-13 MED ORDER — MAGNESIUM GLUCONATE 500 MG PO TABS
500.0000 mg | ORAL_TABLET | Freq: Two times a day (BID) | ORAL | Status: DC
  Administered 2022-02-13 – 2022-02-14 (×2): 500 mg via ORAL
  Filled 2022-02-13 (×2): qty 1

## 2022-02-13 MED ORDER — KCL-LACTATED RINGERS 20 MEQ/L IV SOLN
INTRAVENOUS | Status: DC
  Filled 2022-02-13 (×2): qty 1000

## 2022-02-13 MED ORDER — BUPROPION HCL 75 MG PO TABS
75.0000 mg | ORAL_TABLET | Freq: Three times a day (TID) | ORAL | Status: DC
  Administered 2022-02-13 – 2022-02-14 (×2): 75 mg via ORAL
  Filled 2022-02-13 (×3): qty 1

## 2022-02-13 MED ORDER — LISINOPRIL 20 MG PO TABS
20.0000 mg | ORAL_TABLET | Freq: Every day | ORAL | Status: DC
  Administered 2022-02-13 – 2022-02-14 (×2): 20 mg via ORAL
  Filled 2022-02-13 (×2): qty 1

## 2022-02-13 MED ORDER — MAGNESIUM SULFATE 4 GM/100ML IV SOLN
4.0000 g | Freq: Once | INTRAVENOUS | Status: AC
  Administered 2022-02-13: 4 g via INTRAVENOUS
  Filled 2022-02-13: qty 100

## 2022-02-13 MED ORDER — POTASSIUM CHLORIDE 2 MEQ/ML IV SOLN
INTRAVENOUS | Status: AC
  Filled 2022-02-13 (×2): qty 1000

## 2022-02-13 MED ORDER — SODIUM CHLORIDE 0.9 % IV SOLN
12.5000 mg | Freq: Four times a day (QID) | INTRAVENOUS | Status: AC | PRN
  Administered 2022-02-13: 12.5 mg via INTRAVENOUS
  Filled 2022-02-13: qty 12.5

## 2022-02-13 MED ORDER — LACTATED RINGERS IV BOLUS
1000.0000 mL | Freq: Once | INTRAVENOUS | Status: AC
  Administered 2022-02-13: 1000 mL via INTRAVENOUS

## 2022-02-13 NOTE — Progress Notes (Addendum)
Progress Note    TOCARRA GASSEN  WPY:[id number] DOB: 1966/02/21  DOA: 02/12/2022 PCP: Romualdo Bolk, FNP      Brief Narrative:    Medical records reviewed and are as summarized below:  Shirley Evans is a 56 y.o. female with medical history significant of chronic calculus cholecystitis s/p lap chole on 11/10, hypertension, COPD, thoracic kyphosis from multiple compression fractures and lumbar spinal stenosis under the care of Dr. Cari Caraway (neurosurgeon) history of alcohol and marijuana use but has not used alcohol for 2 weeks.  She presented to the emergency department with nausea and vomiting.  No hematemesis.   Shirley Evans states that after her cholecystectomy, she has been experiencing generalized abdominal pain.  She followed up with general surgery and her PCP for this pain.  On 11/16, she began to experience sudden onset nausea with vomiting.  Since then, her abdominal pain has increased in nature.  Due to severe persistent vomiting, she has been unable to tolerate any p.o. intake.  Intractable nausea and vomiting was treated with antiemetics and IV fluids.  She had hypokalemia and hypomagnesemia that were repleted.  Lactic acidosis was likely due to volume depletion.  She also had hypertensive urgency which improved with antihypertensives.      Assessment/Plan:   Principal Problem:   Intractable vomiting Active Problems:   Hypertensive urgency   AKI (acute kidney injury) (HCC)   Hypomagnesemia   Elevated liver function tests   Hypokalemia    Body mass index is 24.6 kg/m.   Intractable nausea and vomiting with abdominal pain: Etiology unclear.  No evidence of biliary stones on MRCP.  She was evaluated by general surgeon because of recent cholecystectomy.  Surgeon has signed off because her symptoms not related to recent surgery.  Hypokalemia and severe hypomagnesemia: Replete potassium and magnesium intravenously.  Monitor levels.  Hypertensive  urgency: BP is better.  Resume home lisinopril  Severe hepatic steatosis, elevated liver enzymes: Patient has been advised to avoid alcohol.  AKI, lactic acidosis: Resolved.  This was likely from volume depletion     Diet Order             Diet clear liquid Room service appropriate? Yes; Fluid consistency: Thin  Diet effective now                            Consultants: General surgeon  Procedures: None    Medications:    buPROPion  75 mg Oral TID   cyclobenzaprine  10 mg Oral BID   enoxaparin (LOVENOX) injection  40 mg Subcutaneous Q24H   lisinopril  20 mg Oral Daily   magnesium gluconate  500 mg Oral BID   pantoprazole  40 mg Oral Daily   sertraline  100 mg Oral Daily   sodium chloride flush  3 mL Intravenous Q12H   valACYclovir  1,000 mg Oral Daily   Continuous Infusions:  lactated ringers 1,000 mL with potassium chloride 20 mEq/L Pediatric IV infusion     promethazine (PHENERGAN) injection (IM or IVPB)       Anti-infectives (From admission, onward)    Start     Dose/Rate Route Frequency Ordered Stop   02/13/22 1000  valACYclovir (VALTREX) tablet 1,000 mg        1,000 mg Oral Daily 02/12/22 2116                Family Communication/Anticipated D/C date and plan/Code Status  DVT prophylaxis: enoxaparin (LOVENOX) injection 40 mg Start: 02/12/22 2200     Code Status: Full Code  Family Communication: None Disposition Plan: Plan to discharge home tomorrow   Status is: Observation The patient will require care spanning > 2 midnights and should be moved to inpatient because: Nausea and vomiting       Subjective:   c/o nausea, abdominal pain, burning throat from recent vomiting.  She also complains of chronic back pain  Objective:    Vitals:   02/13/22 0200 02/13/22 0828 02/13/22 1211 02/13/22 1615  BP: (!) 132/105 (!) 166/114 (!) 144/105 (!) 145/111  Pulse: 81 81 79 88  Resp: '18 20 18 18  '$ Temp: 98.1 F (36.7 C) 97.7  F (36.5 C) 97.7 F (36.5 C) 98.3 F (36.8 C)  TempSrc: Oral Oral Oral Oral  SpO2: 94% 92% 93% (!) 87%  Weight:      Height:       No data found.   Intake/Output Summary (Last 24 hours) at 02/13/2022 1650 Last data filed at 02/13/2022 1300 Gross per 24 hour  Intake 1543.84 ml  Output --  Net 1543.84 ml   Filed Weights   02/12/22 1205  Weight: 63 kg    Exam:  GEN: NAD SKIN: Warm and dry EYES: No pallor or icterus ENT: MMM CV: RRR PULM: CTA B ABD: soft, ND, mild mid abdominal tenderness without rebound tenderness or guarding, +BS CNS: AAO x 3, non focal EXT: No edema or tenderness        Data Reviewed:   I have personally reviewed following labs and imaging studies:  Labs: Labs show the following:   Basic Metabolic Panel: Recent Labs  Lab 02/12/22 1250 02/13/22 0538  NA 138 134*  K 3.1* 3.5  CL 86* 92*  CO2 29 30  GLUCOSE 227* 109*  BUN 12 15  CREATININE 1.09* 0.88  CALCIUM 8.5* 7.0*  MG  --  0.7*  PHOS  --  4.7*   GFR Estimated Creatinine Clearance: 63 mL/min (by C-G formula based on SCr of 0.88 mg/dL). Liver Function Tests: Recent Labs  Lab 02/12/22 1250 02/13/22 0538  AST 148* 154*  ALT 53* 45*  ALKPHOS 313* 232*  BILITOT 4.3* 4.1*  PROT 8.0 6.5  ALBUMIN 3.6 3.0*   Recent Labs  Lab 02/12/22 1250  LIPASE 46   No results for input(s): "AMMONIA" in the last 168 hours. Coagulation profile No results for input(s): "INR", "PROTIME" in the last 168 hours.  CBC: Recent Labs  Lab 02/12/22 1250 02/13/22 0538  WBC 14.8* 12.9*  NEUTROABS 12.7*  --   HGB 16.2* 12.8  HCT 45.9 36.9  MCV 98.5 100.0  PLT 283 204   Cardiac Enzymes: No results for input(s): "CKTOTAL", "CKMB", "CKMBINDEX", "TROPONINI" in the last 168 hours. BNP (last 3 results) No results for input(s): "PROBNP" in the last 8760 hours. CBG: No results for input(s): "GLUCAP" in the last 168 hours. D-Dimer: No results for input(s): "DDIMER" in the last 72 hours. Hgb  A1c: Recent Labs    02/12/22 2147  HGBA1C 5.1   Lipid Profile: No results for input(s): "CHOL", "HDL", "LDLCALC", "TRIG", "CHOLHDL", "LDLDIRECT" in the last 72 hours. Thyroid function studies: No results for input(s): "TSH", "T4TOTAL", "T3FREE", "THYROIDAB" in the last 72 hours.  Invalid input(s): "FREET3" Anemia work up: No results for input(s): "VITAMINB12", "FOLATE", "FERRITIN", "TIBC", "IRON", "RETICCTPCT" in the last 72 hours. Sepsis Labs: Recent Labs  Lab 02/12/22 1250 02/12/22 1929 02/12/22 2147 02/13/22 5916  02/13/22 1134  WBC 14.8*  --   --  12.9*  --   LATICACIDVEN  --  3.7* 2.7*  --  1.5    Microbiology No results found for this or any previous visit (from the past 240 hour(s)).  Procedures and diagnostic studies:  MR ABDOMEN MRCP W WO CONTAST  Result Date: 02/12/2022 CLINICAL DATA:  Elevated LFTs. Concern for retained biliary calculus. EXAM: MRI ABDOMEN WITHOUT AND WITH CONTRAST (INCLUDING MRCP) TECHNIQUE: Multiplanar multisequence MR imaging of the abdomen was performed both before and after the administration of intravenous contrast. Heavily T2-weighted images of the biliary and pancreatic ducts were obtained, and three-dimensional MRCP images were rendered by post processing. CONTRAST:  68m GADAVIST GADOBUTROL 1 MMOL/ML IV SOLN COMPARISON:  January 03, 2022. CT of the abdomen and pelvis of November of 2023. FINDINGS: Lower chest: Incidental imaging of the lung bases is unremarkable to the extent evaluated on this abdominal MRI. Hepatobiliary: Mild hepatomegaly and marked hepatic steatosis as on recent imaging. Mild caudate hypertrophy. Post cholecystectomy. Trace fluid in the gallbladder fossa and trace stranding. Small amount of fluid measuring approximately 12 x 3 mm in the gallbladder fossa. Trace fluid adjacent to the inferior RIGHT hemiliver. Biliary tree with 7 mm caliber of the common bile duct in the pancreas, 8 mm in the more central common bile duct above  the pancreas. Mild intrahepatic biliary duct dilation. No filling defect within the common bile duct. Tapered appearance of the duct distally on high-resolution biliary images. No focal, suspicious hepatic lesion.  Portal vein is patent. Pancreas: Normal intrinsic T1 signal. No ductal dilation or sign of inflammation. No focal lesion. Spleen:  Normal size and contour. Adrenals/Urinary Tract: Adrenal glands are normal. There is symmetric renal enhancement without hydronephrosis, suspicious renal lesion or perinephric stranding. Stomach/Bowel: Visualized portions within the abdomen are unremarkable. Vascular/Lymphatic: No pathologically enlarged lymph nodes identified. No abdominal aortic aneurysm demonstrated. Other:  None Musculoskeletal: No suspicious bone lesions identified. IMPRESSION: 1. Post cholecystectomy. Trace fluid in the gallbladder fossa and trace fluid adjacent to the inferior RIGHT hemiliver, associated with mild stranding in the gallbladder fossa. Findings are nonspecific given recent surgery and the fluid is quite small in terms of overall volume. If there is concern for biliary leak HIDA scan could be helpful for further evaluation. 2. Biliary tree is more dilated than on previous imaging but without filling defect to suggest retained calculus. Correlate with any relief in abdominal symptoms that could indicate recently passed biliary calculus. 3. Mild hepatomegaly and severe hepatic steatosis as on recent imaging. Mild caudate hypertrophy. Patient may be a risk for developing chronic liver disease/cirrhosis and shows caudate hypertrophy which is a morphologic feature that can be associated with these findings. Hepatology follow-up may be warranted. Electronically Signed   By: GZetta BillsM.D.   On: 02/12/2022 17:31   MR 3D Recon At Scanner  Result Date: 02/12/2022 CLINICAL DATA:  Elevated LFTs. Concern for retained biliary calculus. EXAM: MRI ABDOMEN WITHOUT AND WITH CONTRAST (INCLUDING  MRCP) TECHNIQUE: Multiplanar multisequence MR imaging of the abdomen was performed both before and after the administration of intravenous contrast. Heavily T2-weighted images of the biliary and pancreatic ducts were obtained, and three-dimensional MRCP images were rendered by post processing. CONTRAST:  666mGADAVIST GADOBUTROL 1 MMOL/ML IV SOLN COMPARISON:  January 03, 2022. CT of the abdomen and pelvis of November of 2023. FINDINGS: Lower chest: Incidental imaging of the lung bases is unremarkable to the extent evaluated on this abdominal MRI.  Hepatobiliary: Mild hepatomegaly and marked hepatic steatosis as on recent imaging. Mild caudate hypertrophy. Post cholecystectomy. Trace fluid in the gallbladder fossa and trace stranding. Small amount of fluid measuring approximately 12 x 3 mm in the gallbladder fossa. Trace fluid adjacent to the inferior RIGHT hemiliver. Biliary tree with 7 mm caliber of the common bile duct in the pancreas, 8 mm in the more central common bile duct above the pancreas. Mild intrahepatic biliary duct dilation. No filling defect within the common bile duct. Tapered appearance of the duct distally on high-resolution biliary images. No focal, suspicious hepatic lesion.  Portal vein is patent. Pancreas: Normal intrinsic T1 signal. No ductal dilation or sign of inflammation. No focal lesion. Spleen:  Normal size and contour. Adrenals/Urinary Tract: Adrenal glands are normal. There is symmetric renal enhancement without hydronephrosis, suspicious renal lesion or perinephric stranding. Stomach/Bowel: Visualized portions within the abdomen are unremarkable. Vascular/Lymphatic: No pathologically enlarged lymph nodes identified. No abdominal aortic aneurysm demonstrated. Other:  None Musculoskeletal: No suspicious bone lesions identified. IMPRESSION: 1. Post cholecystectomy. Trace fluid in the gallbladder fossa and trace fluid adjacent to the inferior RIGHT hemiliver, associated with mild stranding  in the gallbladder fossa. Findings are nonspecific given recent surgery and the fluid is quite small in terms of overall volume. If there is concern for biliary leak HIDA scan could be helpful for further evaluation. 2. Biliary tree is more dilated than on previous imaging but without filling defect to suggest retained calculus. Correlate with any relief in abdominal symptoms that could indicate recently passed biliary calculus. 3. Mild hepatomegaly and severe hepatic steatosis as on recent imaging. Mild caudate hypertrophy. Patient may be a risk for developing chronic liver disease/cirrhosis and shows caudate hypertrophy which is a morphologic feature that can be associated with these findings. Hepatology follow-up may be warranted. Electronically Signed   By: Zetta Bills M.D.   On: 02/12/2022 17:31   CT Abdomen Pelvis W Contrast  Result Date: 02/12/2022 CLINICAL DATA:  Acute generalized abdominal pain. Status post cholecystectomy. EXAM: CT ABDOMEN AND PELVIS WITH CONTRAST TECHNIQUE: Multidetector CT imaging of the abdomen and pelvis was performed using the standard protocol following bolus administration of intravenous contrast. RADIATION DOSE REDUCTION: This exam was performed according to the departmental dose-optimization program which includes automated exposure control, adjustment of the mA and/or kV according to patient size and/or use of iterative reconstruction technique. CONTRAST:  14m OMNIPAQUE IOHEXOL 300 MG/ML  SOLN COMPARISON:  January 03, 2022. FINDINGS: Lower chest: No acute abnormality. Hepatobiliary: Hepatic steatosis. Status post cholecystectomy. No biliary dilatation is noted. Pancreas: Unremarkable. No pancreatic ductal dilatation or surrounding inflammatory changes. Spleen: Normal in size without focal abnormality. Adrenals/Urinary Tract: Adrenal glands appear normal. Nonobstructive left renal calculus is noted. No hydronephrosis or renal obstruction is noted. Urinary bladder is  unremarkable. Stomach/Bowel: Stomach is within normal limits. Appendix appears normal. No evidence of bowel wall thickening, distention, or inflammatory changes. Vascular/Lymphatic: Aortic atherosclerosis. No enlarged abdominal or pelvic lymph nodes. Reproductive: Uterus and bilateral adnexa are unremarkable. Other: No abdominal wall hernia or abnormality. No abdominopelvic ascites. Musculoskeletal: Status post left total hip arthroplasty. Stable old T10 and T12 compression fractures. No acute osseous abnormality is noted. IMPRESSION: Hepatic steatosis. Nonobstructive left renal calculus. No hydronephrosis or renal obstruction is noted. Aortic Atherosclerosis (ICD10-I70.0). Electronically Signed   By: JMarijo ConceptionM.D.   On: 02/12/2022 13:55               LOS: 0 days   Helayna Dun  [employer], advertising on [email or web address]. If 7PM-7AM, please contact night-coverage at [email or web address]     02/13/2022, 4:50 PM

## 2022-02-13 NOTE — Progress Notes (Signed)
[facility] Day(s): 0.   Interval History:  Patient seen and examined No acute events or new complaints overnight.  Patient reports burning sensation in her throat Does not seem to have significant abdominal pain Complains of cramping in hands  No emesis Leukocytosis improved; 12.9K (was consistently 14K pre-op) Renal function normalized; sCr - 0.88; UO - unmeasured Hypokalemia improved; 3.5 Hypomagnesemia worsened; 0.7 LFTs and Bilirubin chronically elevated; MRCP negative  CLD ordered but not eating   Vital signs in last 24 hours: [min-max] current  Temp:  [97.7 F (36.5 C)-98.3 F (36.8 C)] 97.7 F (36.5 C) (11/21 0828) Pulse Rate:  [81-136] 81 (11/21 0828) Resp:  [15-28] 20 (11/21 0828) BP: (132-209)/(105-146) 166/114 (11/21 0828) SpO2:  [86 %-96 %] 92 % (11/21 0828) Weight:  [63 kg] 63 kg (11/20 1205)     Height: '5\' 3"'$  (160 cm) Weight: 63 kg BMI (Calculated): 24.61   Intake/Output last 2 shifts:  11/20 0701 - 11/21 0700 In: 2183.8 [IV Piggyback:2183.8] Out: -    Physical Exam:  Constitutional: alert, cooperative and no distress  Respiratory: breathing non-labored at rest  Cardiovascular: regular rate and sinus rhythm  Gastrointestinal: soft, non-tender, non-distended, no rebound/guarding  Integumentary: Laparoscopic incisions are CDI with dermabond, no erythema or drainage   Labs:     Latest Ref Rng & Units 02/13/2022    5:38 AM 02/12/2022   12:50 PM 02/03/2022    4:35 AM  CBC  WBC 4.0 - 10.5 K/uL 12.9  14.8  14.1   Hemoglobin 12.0 - 15.0 g/dL 12.8  16.2  14.0   Hematocrit 36.0 - 46.0 % 36.9  45.9  40.1   Platelets 150 - 400 K/uL 204  283  195       Latest Ref Rng & Units 02/13/2022    5:38 AM 02/12/2022   12:50 PM 02/03/2022    4:35 AM  CMP  Glucose 70 - 99 mg/dL 109  227  121   BUN 6 - 20 mg/dL '15  12  7   '$ Creatinine 0.44 - 1.00 mg/dL 0.88  1.09  0.83   Sodium 135 - 145 mmol/L 134  138  133    Potassium 3.5 - 5.1 mmol/L 3.5  3.1  4.0   Chloride 98 - 111 mmol/L 92  86  98   CO2 22 - 32 mmol/L '30  29  23   '$ Calcium 8.9 - 10.3 mg/dL 7.0  8.5  7.2   Total Protein 6.5 - 8.1 g/dL 6.5  8.0    Total Bilirubin 0.3 - 1.2 mg/dL 4.1  4.3    Alkaline Phos 38 - 126 U/L 232  313    AST 15 - 41 U/L 154  148    ALT 0 - 44 U/L 45  53     Imaging studies: No new pertinent imaging studies   Assessment/Plan:  56 y.o. female with intractable nausea/emesis 11 days s/p robotic assisted laparoscopic cholecystectomy for CCC   - No evidence of abscess, biloma, bile injury, nor choledocholithiasis based on ED work up.   - Okay to continue diet trial and advance as tolerated   - Monitor abdominal examination; on-going bowel function   - Pain control prn; antiemetics prn   - Mobilization as tolerated - Further management per primary service  - Nothing to add from general surgery perspective currently. We will remain available if things change or questions/concerns arise.    All of the above findings  and recommendations were discussed with the patient, and the medical team, and all of patient's questions were answered to her expressed satisfaction.  -- Edison Simon, PA-C [facility] 02/13/2022, 8:58 AM M-F: 7am - 4pm

## 2022-02-14 ENCOUNTER — Encounter: Payer: Self-pay | Admitting: Internal Medicine

## 2022-02-14 ENCOUNTER — Observation Stay: Payer: Medicare HMO

## 2022-02-14 DIAGNOSIS — R112 Nausea with vomiting, unspecified: Secondary | ICD-10-CM | POA: Diagnosis not present

## 2022-02-14 DIAGNOSIS — R1110 Vomiting, unspecified: Secondary | ICD-10-CM | POA: Diagnosis not present

## 2022-02-14 LAB — CBC WITH DIFFERENTIAL/PLATELET
Abs Immature Granulocytes: 0.05 K/uL (ref 0.00–0.07)
Basophils Absolute: 0.1 K/uL (ref 0.0–0.1)
Basophils Relative: 1 %
Eosinophils Absolute: 0.2 K/uL (ref 0.0–0.5)
Eosinophils Relative: 1 %
HCT: 36.4 % (ref 36.0–46.0)
Hemoglobin: 12.8 g/dL (ref 12.0–15.0)
Immature Granulocytes: 0 %
Lymphocytes Relative: 21 %
Lymphs Abs: 2.7 K/uL (ref 0.7–4.0)
MCH: 35.8 pg — ABNORMAL HIGH (ref 26.0–34.0)
MCHC: 35.2 g/dL (ref 30.0–36.0)
MCV: 101.7 fL — ABNORMAL HIGH (ref 80.0–100.0)
Monocytes Absolute: 0.8 K/uL (ref 0.1–1.0)
Monocytes Relative: 6 %
Neutro Abs: 8.9 K/uL — ABNORMAL HIGH (ref 1.7–7.7)
Neutrophils Relative %: 71 %
Platelets: 205 K/uL (ref 150–400)
RBC: 3.58 MIL/uL — ABNORMAL LOW (ref 3.87–5.11)
RDW: 13.9 % (ref 11.5–15.5)
WBC: 12.6 K/uL — ABNORMAL HIGH (ref 4.0–10.5)
nRBC: 0 % (ref 0.0–0.2)

## 2022-02-14 LAB — BASIC METABOLIC PANEL WITH GFR
Anion gap: 12 (ref 5–15)
BUN: 21 mg/dL — ABNORMAL HIGH (ref 6–20)
CO2: 30 mmol/L (ref 22–32)
Calcium: 7.2 mg/dL — ABNORMAL LOW (ref 8.9–10.3)
Chloride: 88 mmol/L — ABNORMAL LOW (ref 98–111)
Creatinine, Ser: 1.17 mg/dL — ABNORMAL HIGH (ref 0.44–1.00)
GFR, Estimated: 54 mL/min — ABNORMAL LOW
Glucose, Bld: 70 mg/dL (ref 70–99)
Potassium: 3.3 mmol/L — ABNORMAL LOW (ref 3.5–5.1)
Sodium: 130 mmol/L — ABNORMAL LOW (ref 135–145)

## 2022-02-14 LAB — MAGNESIUM: Magnesium: 1.5 mg/dL — ABNORMAL LOW (ref 1.7–2.4)

## 2022-02-14 LAB — PHOSPHORUS: Phosphorus: 3.2 mg/dL (ref 2.5–4.6)

## 2022-02-14 MED ORDER — VITAMIN D 25 MCG (1000 UNIT) PO TABS
1000.0000 [IU] | ORAL_TABLET | Freq: Every day | ORAL | Status: DC
  Administered 2022-02-14: 1000 [IU] via ORAL
  Filled 2022-02-14: qty 1

## 2022-02-14 MED ORDER — OXYCODONE-ACETAMINOPHEN 5-325 MG PO TABS: 1.0000 | ORAL_TABLET | Freq: Three times a day (TID) | ORAL | 0 refills | Status: DC | PRN

## 2022-02-14 MED ORDER — POTASSIUM CHLORIDE CRYS ER 20 MEQ PO TBCR
40.0000 meq | EXTENDED_RELEASE_TABLET | Freq: Once | ORAL | Status: AC
  Administered 2022-02-14: 40 meq via ORAL
  Filled 2022-02-14: qty 2

## 2022-02-14 MED ORDER — MAGNESIUM SULFATE 2 GM/50ML IV SOLN
2.0000 g | Freq: Once | INTRAVENOUS | Status: AC
  Administered 2022-02-14: 2 g via INTRAVENOUS
  Filled 2022-02-14: qty 50

## 2022-02-14 MED ORDER — ALBUTEROL SULFATE (2.5 MG/3ML) 0.083% IN NEBU: 2.5000 mg | INHALATION_SOLUTION | Freq: Four times a day (QID) | RESPIRATORY_TRACT | 1 refills | Status: AC | PRN

## 2022-02-14 MED ORDER — OXYCODONE-ACETAMINOPHEN 5-325 MG PO TABS
1.0000 | ORAL_TABLET | Freq: Four times a day (QID) | ORAL | Status: DC | PRN
  Administered 2022-02-14: 1 via ORAL
  Filled 2022-02-14: qty 1

## 2022-02-14 NOTE — Discharge Summary (Signed)
Physician Discharge Summary   Patient: Shirley Evans MRN: [id number] DOB: 06-20-1965  Admit date:     02/12/2022  Discharge date: 02/14/22  Discharge Physician: Fritzi Mandes   PCP: Romualdo Bolk, FNP   Recommendations at discharge:   follow-up PCP in 1 to 2 weeks  Discharge Diagnoses: Principal Problem:   Intractable vomiting Active Problems:   Hypertensive urgency   AKI (acute kidney injury) ([place])   Hypomagnesemia   Elevated liver function tests   Hypokalemia   Hospital Course: Shirley Evans is a 56 y.o. female with medical history significant of chronic calculus cholecystitis s/p lap chole on 11/10, hypertension, COPD, thoracic kyphosis from multiple compression fractures and lumbar spinal stenosis under the care of Dr. Cari Caraway (neurosurgeon) history of alcohol and marijuana use but has not used alcohol for 2 weeks.  She presented to the emergency department with nausea and vomiting.  No hematemesis.   [name] states that after her cholecystectomy, she has been experiencing generalized abdominal pain.  She followed up with general surgery and her PCP for this pain.  On 11/16, she began to experience sudden onset nausea with vomiting.   #Intractable nausea and vomiting with abdominal pain: Etiology unclear.  No evidence of biliary stones on MRCP.  She was evaluated by general surgeon because of recent cholecystectomy.  Surgeon has signed off because her symptoms not related to recent surgery. -- Patient tolerating PO diet well.   #Hypokalemia and severe hypomagnesemia:  -- improving.  #Hypertensive urgency: BP is better.  Resume home lisinopril --BP improved   #Severe hepatic steatosis, elevated liver enzymes: Patient has been advised to avoid alcohol.   #AKI, lactic acidosis: Resolved.  This was likely from volume depletion  #Bilateral neck swelling suspect lipoma -- chest x-ray no evidence of subcutaneous emphysema -- asymptomatic. Recommend follow-up  with PCP and continue to monitor the size.  Will DC patient to home with outpatient PCP follow-up        Consultants: surgery Procedures performed: none  Disposition: Home Diet recommendation:  Discharge Diet Orders (From admission, onward)     Start     Ordered   02/14/22 0000  Diet - low sodium heart healthy        02/14/22 1330           Cardiac diet DISCHARGE MEDICATION: Allergies as of 02/14/2022       Reactions   Demerol [meperidine] Nausea Only, Nausea And Vomiting   Codeine Nausea Only        Medication List     STOP taking these medications    dicyclomine 10 MG capsule Commonly known as: BENTYL       TAKE these medications    albuterol 108 (90 Base) MCG/ACT inhaler Commonly known as: VENTOLIN HFA Inhale into the lungs. What changed: Another medication with the same name was changed. Make sure you understand how and when to take each.   albuterol (2.5 MG/3ML) 0.083% nebulizer solution Commonly known as: PROVENTIL Take 3 mLs (2.5 mg total) by nebulization every 6 (six) hours as needed for wheezing or shortness of breath. What changed:  how much to take how to take this when to take this reasons to take this   buPROPion 75 MG tablet Commonly known as: WELLBUTRIN Take 75 mg by mouth 3 (three) times daily.   cholecalciferol 25 MCG (1000 UNIT) tablet Commonly known as: VITAMIN D3 Take 1,000 Units by mouth daily.   cyclobenzaprine 10 MG tablet Commonly known as: FLEXERIL  Take 10 mg by mouth 2 (two) times daily.   lidocaine 5 % Commonly known as: [place] onto the skin.   lisinopril 20 MG tablet Commonly known as: ZESTRIL Take 1 tablet (20 mg total) by mouth daily.   loperamide 2 MG capsule Commonly known as: IMODIUM Take 1 capsule (2 mg total) by mouth as needed for diarrhea or loose stools.   magnesium gluconate 500 MG tablet Commonly known as: MAGONATE Take 1 tablet (500 mg total) by mouth 2 (two) times daily.    omeprazole 40 MG capsule Commonly known as: PRILOSEC Take 40 mg by mouth 2 (two) times daily.   ondansetron 4 MG disintegrating tablet Commonly known as: ZOFRAN-ODT Take 1 tablet (4 mg total) by mouth every 8 (eight) hours as needed for nausea or vomiting.   oxyCODONE-acetaminophen 5-325 MG tablet Commonly known as: PERCOCET/ROXICET Take 1 tablet by mouth every 8 (eight) hours as needed for severe pain. What changed: when to take this   rosuvastatin 40 MG tablet Commonly known as: CRESTOR Take 40 mg by mouth daily.   sertraline 100 MG tablet Commonly known as: ZOLOFT Take 100 mg by mouth daily.   valACYclovir 1000 MG tablet Commonly known as: VALTREX Take 1,000 mg by mouth daily.        Follow-up Information     Romualdo Bolk, FNP. Go on 02/21/2022.   Specialty: Nurse Practitioner Why: '@1'$ :20pm Contact information: [address] [place] [address] [phone]                    Condition at discharge: fair  The results of significant diagnostics from this hospitalization (including imaging, microbiology, ancillary and laboratory) are listed below for reference.   Imaging Studies: DG Chest Port 1 View  Result Date: 02/14/2022 CLINICAL DATA:  COPD. EXAM: PORTABLE CHEST 1 VIEW COMPARISON:  Chest radiographs 08/25/2021 and chest CT 12/07/2021 FINDINGS: The cardiomediastinal silhouette is within normal limits. No airspace consolidation, edema, sizable pleural effusion, pneumothorax is identified. No acute osseous abnormality is seen. IMPRESSION: No active disease. Electronically Signed   By: Logan Bores M.D.   On: 02/14/2022 13:15   MR ABDOMEN MRCP W WO CONTAST  Result Date: 02/12/2022 CLINICAL DATA:  Elevated LFTs. Concern for retained biliary calculus. EXAM: MRI ABDOMEN WITHOUT AND WITH CONTRAST (INCLUDING MRCP) TECHNIQUE: Multiplanar multisequence MR imaging of the abdomen was performed both before and after the administration of intravenous  contrast. Heavily T2-weighted images of the biliary and pancreatic ducts were obtained, and three-dimensional MRCP images were rendered by post processing. CONTRAST:  73m GADAVIST GADOBUTROL 1 MMOL/ML IV SOLN COMPARISON:  January 03, 2022. CT of the abdomen and pelvis of November of 2023. FINDINGS: Lower chest: Incidental imaging of the lung bases is unremarkable to the extent evaluated on this abdominal MRI. Hepatobiliary: Mild hepatomegaly and marked hepatic steatosis as on recent imaging. Mild caudate hypertrophy. Post cholecystectomy. Trace fluid in the gallbladder fossa and trace stranding. Small amount of fluid measuring approximately 12 x 3 mm in the gallbladder fossa. Trace fluid adjacent to the inferior RIGHT hemiliver. Biliary tree with 7 mm caliber of the common bile duct in the pancreas, 8 mm in the more central common bile duct above the pancreas. Mild intrahepatic biliary duct dilation. No filling defect within the common bile duct. Tapered appearance of the duct distally on high-resolution biliary images. No focal, suspicious hepatic lesion.  Portal vein is patent. Pancreas: Normal intrinsic T1 signal. No ductal dilation or sign of  inflammation. No focal lesion. Spleen:  Normal size and contour. Adrenals/Urinary Tract: Adrenal glands are normal. There is symmetric renal enhancement without hydronephrosis, suspicious renal lesion or perinephric stranding. Stomach/Bowel: Visualized portions within the abdomen are unremarkable. Vascular/Lymphatic: No pathologically enlarged lymph nodes identified. No abdominal aortic aneurysm demonstrated. Other:  None Musculoskeletal: No suspicious bone lesions identified. IMPRESSION: 1. Post cholecystectomy. Trace fluid in the gallbladder fossa and trace fluid adjacent to the inferior RIGHT hemiliver, associated with mild stranding in the gallbladder fossa. Findings are nonspecific given recent surgery and the fluid is quite small in terms of overall volume. If there  is concern for biliary leak HIDA scan could be helpful for further evaluation. 2. Biliary tree is more dilated than on previous imaging but without filling defect to suggest retained calculus. Correlate with any relief in abdominal symptoms that could indicate recently passed biliary calculus. 3. Mild hepatomegaly and severe hepatic steatosis as on recent imaging. Mild caudate hypertrophy. Patient may be a risk for developing chronic liver disease/cirrhosis and shows caudate hypertrophy which is a morphologic feature that can be associated with these findings. Hepatology follow-up may be warranted. Electronically Signed   By: Zetta Bills M.D.   On: 02/12/2022 17:31   MR 3D Recon At Scanner  Result Date: 02/12/2022 CLINICAL DATA:  Elevated LFTs. Concern for retained biliary calculus. EXAM: MRI ABDOMEN WITHOUT AND WITH CONTRAST (INCLUDING MRCP) TECHNIQUE: Multiplanar multisequence MR imaging of the abdomen was performed both before and after the administration of intravenous contrast. Heavily T2-weighted images of the biliary and pancreatic ducts were obtained, and three-dimensional MRCP images were rendered by post processing. CONTRAST:  37m GADAVIST GADOBUTROL 1 MMOL/ML IV SOLN COMPARISON:  January 03, 2022. CT of the abdomen and pelvis of November of 2023. FINDINGS: Lower chest: Incidental imaging of the lung bases is unremarkable to the extent evaluated on this abdominal MRI. Hepatobiliary: Mild hepatomegaly and marked hepatic steatosis as on recent imaging. Mild caudate hypertrophy. Post cholecystectomy. Trace fluid in the gallbladder fossa and trace stranding. Small amount of fluid measuring approximately 12 x 3 mm in the gallbladder fossa. Trace fluid adjacent to the inferior RIGHT hemiliver. Biliary tree with 7 mm caliber of the common bile duct in the pancreas, 8 mm in the more central common bile duct above the pancreas. Mild intrahepatic biliary duct dilation. No filling defect within the common  bile duct. Tapered appearance of the duct distally on high-resolution biliary images. No focal, suspicious hepatic lesion.  Portal vein is patent. Pancreas: Normal intrinsic T1 signal. No ductal dilation or sign of inflammation. No focal lesion. Spleen:  Normal size and contour. Adrenals/Urinary Tract: Adrenal glands are normal. There is symmetric renal enhancement without hydronephrosis, suspicious renal lesion or perinephric stranding. Stomach/Bowel: Visualized portions within the abdomen are unremarkable. Vascular/Lymphatic: No pathologically enlarged lymph nodes identified. No abdominal aortic aneurysm demonstrated. Other:  None Musculoskeletal: No suspicious bone lesions identified. IMPRESSION: 1. Post cholecystectomy. Trace fluid in the gallbladder fossa and trace fluid adjacent to the inferior RIGHT hemiliver, associated with mild stranding in the gallbladder fossa. Findings are nonspecific given recent surgery and the fluid is quite small in terms of overall volume. If there is concern for biliary leak HIDA scan could be helpful for further evaluation. 2. Biliary tree is more dilated than on previous imaging but without filling defect to suggest retained calculus. Correlate with any relief in abdominal symptoms that could indicate recently passed biliary calculus. 3. Mild hepatomegaly and severe hepatic steatosis as on recent imaging. Mild  caudate hypertrophy. Patient may be a risk for developing chronic liver disease/cirrhosis and shows caudate hypertrophy which is a morphologic feature that can be associated with these findings. Hepatology follow-up may be warranted. Electronically Signed   By: Zetta Bills M.D.   On: 02/12/2022 17:31   CT Abdomen Pelvis W Contrast  Result Date: 02/12/2022 CLINICAL DATA:  Acute generalized abdominal pain. Status post cholecystectomy. EXAM: CT ABDOMEN AND PELVIS WITH CONTRAST TECHNIQUE: Multidetector CT imaging of the abdomen and pelvis was performed using the  standard protocol following bolus administration of intravenous contrast. RADIATION DOSE REDUCTION: This exam was performed according to the departmental dose-optimization program which includes automated exposure control, adjustment of the mA and/or kV according to patient size and/or use of iterative reconstruction technique. CONTRAST:  157m OMNIPAQUE IOHEXOL 300 MG/ML  SOLN COMPARISON:  January 03, 2022. FINDINGS: Lower chest: No acute abnormality. Hepatobiliary: Hepatic steatosis. Status post cholecystectomy. No biliary dilatation is noted. Pancreas: Unremarkable. No pancreatic ductal dilatation or surrounding inflammatory changes. Spleen: Normal in size without focal abnormality. Adrenals/Urinary Tract: Adrenal glands appear normal. Nonobstructive left renal calculus is noted. No hydronephrosis or renal obstruction is noted. Urinary bladder is unremarkable. Stomach/Bowel: Stomach is within normal limits. Appendix appears normal. No evidence of bowel wall thickening, distention, or inflammatory changes. Vascular/Lymphatic: Aortic atherosclerosis. No enlarged abdominal or pelvic lymph nodes. Reproductive: Uterus and bilateral adnexa are unremarkable. Other: No abdominal wall hernia or abnormality. No abdominopelvic ascites. Musculoskeletal: Status post left total hip arthroplasty. Stable old T10 and T12 compression fractures. No acute osseous abnormality is noted. IMPRESSION: Hepatic steatosis. Nonobstructive left renal calculus. No hydronephrosis or renal obstruction is noted. Aortic Atherosclerosis (ICD10-I70.0). Electronically Signed   By: JMarijo ConceptionM.D.   On: 02/12/2022 13:55   [place]ABDOM[place] LIMITED RUQ (LIVER/GB)  Result Date: 02/02/2022 CLINICAL DATA:  Abdominal pain.  Elevated LFTs. EXAM: ULTRASOUND ABDOMEN LIMITED RIGHT UPPER QUADRANT COMPARISON:  MRI 01/03/2022.  Ultrasound 01/03/2022 FINDINGS: Gallbladder: Sludge and small stones are noted in the lumen of the gallbladder. No gallbladder wall  thickening or pericholecystic fluid. No sonographic Murphy sign. Common bile duct: Diameter: 2 mm Liver: No focal lesion identified. Within normal limits in parenchymal echogenicity. Portal vein is patent on color Doppler imaging with normal direction of blood flow towards the liver. Other: None. IMPRESSION: Sludge and small stones in the lumen of the gallbladder without biliary dilatation. No sonographic findings to suggest acute cholecystitis. Electronically Signed   By: EMisty StanleyM.D.   On: 02/02/2022 10:02    Microbiology: Results for orders placed or performed during the hospital encounter of 02/02/22  Gastrointestinal Panel by PCR , Stool     Status: None   Collection Time: 02/03/22  9:15 AM   Specimen: STOOL  Result Value Ref Range Status   Campylobacter species NOT DETECTED NOT DETECTED Final   Plesimonas shigelloides NOT DETECTED NOT DETECTED Final   Salmonella species NOT DETECTED NOT DETECTED Final   Yersinia enterocolitica NOT DETECTED NOT DETECTED Final   Vibrio species NOT DETECTED NOT DETECTED Final   Vibrio cholerae NOT DETECTED NOT DETECTED Final   Enteroaggregative E coli (EAEC) NOT DETECTED NOT DETECTED Final   Enteropathogenic E coli (EPEC) NOT DETECTED NOT DETECTED Final   Enterotoxigenic E coli (ETEC) NOT DETECTED NOT DETECTED Final   Shiga like toxin producing E coli (STEC) NOT DETECTED NOT DETECTED Final   Shigella/Enteroinvasive E coli (EIEC) NOT DETECTED NOT DETECTED Final   Cryptosporidium NOT DETECTED NOT DETECTED Final   Cyclospora  cayetanensis NOT DETECTED NOT DETECTED Final   Entamoeba histolytica NOT DETECTED NOT DETECTED Final   Giardia lamblia NOT DETECTED NOT DETECTED Final   Adenovirus F40/41 NOT DETECTED NOT DETECTED Final   Astrovirus NOT DETECTED NOT DETECTED Final   Norovirus GI/GII NOT DETECTED NOT DETECTED Final   Rotavirus A NOT DETECTED NOT DETECTED Final   Sapovirus (I, II, IV, and V) NOT DETECTED NOT DETECTED Final    Comment: Performed at  [facility], [address] [place], [place] [address]    Labs: CBC: Recent Labs  Lab 02/12/22 1250 02/13/22 0538 02/14/22 0418  WBC 14.8* 12.9* 12.6*  NEUTROABS 12.7*  --  8.9*  HGB 16.2* 12.8 12.8  HCT 45.9 36.9 36.4  MCV 98.5 100.0 101.7*  PLT 283 204 629   Basic Metabolic Panel: Recent Labs  Lab 02/12/22 1250 02/13/22 0538 02/14/22 0418  NA 138 134* 130*  K 3.1* 3.5 3.3*  CL 86* 92* 88*  CO2 '29 30 30  '$ GLUCOSE 227* 109* 70  BUN 12 15 21*  CREATININE 1.09* 0.88 1.17*  CALCIUM 8.5* 7.0* 7.2*  MG  --  0.7* 1.5*  PHOS  --  4.7* 3.2   Liver Function Tests: Recent Labs  Lab 02/12/22 1250 02/13/22 0538  AST 148* 154*  ALT 53* 45*  ALKPHOS 313* 232*  BILITOT 4.3* 4.1*  PROT 8.0 6.5  ALBUMIN 3.6 3.0*   CBG: No results for input(s): "GLUCAP" in the last 168 hours.  Discharge time spent: greater than 30 minutes.  Signed: Fritzi Mandes, MD [facility] 02/14/2022

## 2022-02-14 NOTE — Discharge Instructions (Signed)
Use your inhaler as before

## 2022-02-14 NOTE — Progress Notes (Signed)
Received MD order to discharge patient to home, reviewed discharge instructions,  prescriptions and home meds with patient and patient verbalized understanding.

## 2022-02-20 ENCOUNTER — Encounter: Payer: Self-pay | Admitting: Physician Assistant

## 2022-02-20 ENCOUNTER — Ambulatory Visit (INDEPENDENT_AMBULATORY_CARE_PROVIDER_SITE_OTHER): Payer: Medicare HMO | Admitting: Physician Assistant

## 2022-02-20 VITALS — BP 138/105 | HR 157 | Wt 134.8 lb

## 2022-02-20 DIAGNOSIS — K8010 Calculus of gallbladder with chronic cholecystitis without obstruction: Secondary | ICD-10-CM

## 2022-02-20 DIAGNOSIS — R112 Nausea with vomiting, unspecified: Secondary | ICD-10-CM

## 2022-02-20 DIAGNOSIS — R197 Diarrhea, unspecified: Secondary | ICD-10-CM

## 2022-02-20 NOTE — Patient Instructions (Addendum)
A referral has been placed with [facility]. They will call you with an appointment.   If you have any concerns or questions, please feel free to call our office.    GENERAL POST-OPERATIVE PATIENT INSTRUCTIONS   WOUND CARE INSTRUCTIONS:  Keep a dry clean dressing on the wound if there is drainage. The initial bandage may be removed after 24 hours.  Once the wound has quit draining you may leave it open to air.  If clothing rubs against the wound or causes irritation and the wound is not draining you may cover it with a dry dressing during the daytime.  Try to keep the wound dry and avoid ointments on the wound unless directed to do so.  If the wound becomes bright red and painful or starts to drain infected material that is not clear, please contact your physician immediately.  If the wound is mildly pink and has a thick firm ridge underneath it, this is normal, and is referred to as a healing ridge.  This will resolve over the next 4-6 weeks.  BATHING: You may shower if you have been informed of this by your surgeon. However, Please do not submerge in a tub, hot tub, or pool until incisions are completely sealed or have been told by your surgeon that you may do so.  DIET:  You may eat any foods that you can tolerate.  It is a good idea to eat a high fiber diet and take in plenty of fluids to prevent constipation.  If you do become constipated you may want to take a mild laxative or take ducolax tablets on a daily basis until your bowel habits are regular.  Constipation can be very uncomfortable, along with straining, after recent surgery.  ACTIVITY:  You are encouraged to cough and deep breath or use your incentive spirometer if you were given one, every 15-30 minutes when awake.  This will help prevent respiratory complications and low grade fevers post-operatively if you had a general anesthetic.  You may want to hug a pillow when coughing and sneezing to add additional support to the surgical  area, if you had abdominal or chest surgery, which will decrease pain during these times.  You are encouraged to walk and engage in light activity for the next two weeks.  You should not lift more than 20 pounds for 6 weeks total after surgery as it could put you at increased risk for complications.  Twenty pounds is roughly equivalent to a plastic bag of groceries. At that time- Listen to your body when lifting, if you have pain when lifting, stop and then try again in a few days. Soreness after doing exercises or activities of daily living is normal as you get back in to your normal routine.  MEDICATIONS:  Try to take narcotic medications and anti-inflammatory medications, such as tylenol, ibuprofen, naprosyn, etc., with food.  This will minimize stomach upset from the medication.  Should you develop nausea and vomiting from the pain medication, or develop a rash, please discontinue the medication and contact your physician.  You should not drive, make important decisions, or operate machinery when taking narcotic pain medication.  SUNBLOCK Use sun block to incision area over the next year if this area will be exposed to sun. This helps decrease scarring and will allow you avoid a permanent darkened area over your incision.  QUESTIONS:  Please feel free to call our office if you have any questions, and we will be glad to  assist you. [phone]

## 2022-02-20 NOTE — Progress Notes (Signed)
[facility] POST-OP OFFICE VISIT  02/20/2022  HPI: Shirley Evans is a 56 y.o. female 18 days s/p robotic assisted laparoscopic cholecystectomy for [facility] with Dr Christian Mate, complicated by readmission for intractable nausea/emesis and work up was negative for any surgical complication.   Unfortunately, her symptoms seem to be rather unchanged compared to pre-operatively. She continues to have unchanged nausea, intermittent emesis, and diarrhea. Again, she states "this is about the same" compared to before surgery. She has ondansetron at home but this does not seem to be helping. She notes "she is only able to tolerate a bottle of water a day." Abdominal pain is improved as side from incisional soreness. No measured fevers.   Vital signs: BP (!) 138/105   Pulse (!) 157   Wt 134 lb 12.8 oz (61.1 kg)   SpO2 100%   BMI 23.88 kg/m    Physical Exam: Constitutional: Well appearing female, NAD Abdomen: Soft, mild incisional soreness, non-distended, no rebound/guarding Skin: Laparoscopic incisions are healing well, no erythema or drainage   Assessment/Plan: This is a 56 y.o. female 18 days s/p robotic assisted laparoscopic cholecystectomy for [facility]   - Unfortunately, it seems she continues to have unchanged symptoms despite cholecystectomy. She had extensive work up as well during recent admission which do not show any complication from cholecystectomy either. At this point, I feel the next best step may be referral to gastroenterology for consideration of endoscopic evaluation. Patient did note she had an appointment for this at [facility] but was cancelled. We can try referral to [facility] as well. Unfortunately, we do not have much more to offer surgically at this moment.    - Pain control prn; antiemetics prn  - Encouraged nutritional supplementation as tolerated  - Reviewed wound care recommendation  - Reviewed lifting restrictions; 4 weeks total  - Reviewed surgical pathology;  [place]  - We will remain available on as needed basis   -- Edison Simon, PA-C [facility] 02/20/2022, 2:41 PM M-F: 7am - 4pm

## 2022-03-15 ENCOUNTER — Ambulatory Visit: Admit: 2022-03-15 | Payer: Medicare HMO | Admitting: Gastroenterology

## 2022-03-15 SURGERY — COLONOSCOPY
Anesthesia: General

## 2022-05-24 ENCOUNTER — Ambulatory Visit: Payer: Medicare HMO | Admitting: Neurosurgery

## 2022-05-31 ENCOUNTER — Ambulatory Visit: Admit: 2022-05-31 | Payer: Medicare HMO | Admitting: Gastroenterology

## 2022-05-31 SURGERY — COLONOSCOPY
Anesthesia: General

## 2022-08-06 ENCOUNTER — Telehealth: Payer: Self-pay | Admitting: Neurosurgery

## 2022-08-06 NOTE — Telephone Encounter (Signed)
Can you review for what type of xrays she would need? AP & lateral thoracic and lumbar?

## 2022-08-06 NOTE — Telephone Encounter (Signed)
Patient notified, she will wait and see Dr.Yarbrough.

## 2022-08-06 NOTE — Telephone Encounter (Signed)
She does not need any imaging done prior to her follow up with Myer Haff. If he wants any updated imaging, he will order at that visit.

## 2022-08-06 NOTE — Telephone Encounter (Signed)
This patient called back and rescheduled his appt with Dr.Yarbrough for 5/28, she had to cancel it back in February because she was falling so much. She would like to go ahead and have the xray order placed since it's hard for her to find transportation. That way one day that she is in [place] she can have it done.

## 2022-08-21 ENCOUNTER — Telehealth: Payer: Self-pay | Admitting: Neurosurgery

## 2022-08-21 ENCOUNTER — Ambulatory Visit: Payer: Medicare HMO | Admitting: Neurosurgery
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
## 2022-08-23 ENCOUNTER — Ambulatory Visit: Payer: Medicare HMO | Admitting: Neurosurgery

## 2022-08-27 ENCOUNTER — Telehealth: Payer: Self-pay | Admitting: Neurosurgery

## 2022-08-27 DIAGNOSIS — S22080S Wedge compression fracture of T11-T12 vertebra, sequela: Secondary | ICD-10-CM

## 2022-08-27 NOTE — Telephone Encounter (Signed)
Okay to get updated lumbar xrays. I will order them now. She can get at [facility] anytime prior to her visit on Thursday.   Please let her know.

## 2022-08-27 NOTE — Telephone Encounter (Signed)
Pt called and asked if we could put an order in for xrays before her appt for Thursday at 10   Current notes for appt indicate no xrays but pt is sure he is going to want them and wants to go ahead and get them done this week while she has transportation.

## 2022-08-27 NOTE — Telephone Encounter (Signed)
Left message on her voice mail that her xray order has been placed.

## 2022-08-30 ENCOUNTER — Ambulatory Visit
Admission: RE | Admit: 2022-08-30 | Discharge: 2022-08-30 | Disposition: A | Discharge status: Home / Self Care | Payer: Medicare HMO | Source: Ambulatory Visit | Attending: Orthopedic Surgery | Admitting: Orthopedic Surgery

## 2022-08-30 ENCOUNTER — Encounter: Payer: Self-pay | Admitting: Neurosurgery

## 2022-08-30 ENCOUNTER — Ambulatory Visit
Admission: RE | Admit: 2022-08-30 | Discharge: 2022-08-30 | Disposition: A | Discharge status: Home / Self Care | Payer: Medicare HMO | Attending: Orthopedic Surgery | Admitting: Orthopedic Surgery

## 2022-08-30 ENCOUNTER — Ambulatory Visit (INDEPENDENT_AMBULATORY_CARE_PROVIDER_SITE_OTHER): Payer: Medicare HMO | Admitting: Neurosurgery

## 2022-08-30 VITALS — Ht 63.0 in | Wt 138.2 lb

## 2022-08-30 DIAGNOSIS — S22080S Wedge compression fracture of T11-T12 vertebra, sequela: Secondary | ICD-10-CM | POA: Diagnosis present

## 2022-08-30 DIAGNOSIS — M5442 Lumbago with sciatica, left side: Secondary | ICD-10-CM

## 2022-08-30 DIAGNOSIS — R296 Repeated falls: Secondary | ICD-10-CM

## 2022-08-30 DIAGNOSIS — S22080D Wedge compression fracture of T11-T12 vertebra, subsequent encounter for fracture with routine healing: Secondary | ICD-10-CM | POA: Diagnosis not present

## 2022-08-30 DIAGNOSIS — G4489 Other headache syndrome: Secondary | ICD-10-CM | POA: Diagnosis not present

## 2022-08-30 DIAGNOSIS — Z7689 Persons encountering health services in other specified circumstances: Secondary | ICD-10-CM

## 2022-08-30 DIAGNOSIS — S32030D Wedge compression fracture of third lumbar vertebra, subsequent encounter for fracture with routine healing: Secondary | ICD-10-CM

## 2022-08-30 DIAGNOSIS — M5441 Lumbago with sciatica, right side: Secondary | ICD-10-CM

## 2022-08-30 DIAGNOSIS — G8929 Other chronic pain: Secondary | ICD-10-CM

## 2022-08-30 NOTE — Progress Notes (Signed)
Unable to obtain BP due to her not sitting still and medications were not reviewed because she didn't know what she was taking.

## 2022-08-30 NOTE — Progress Notes (Signed)
Referring Physician:  Olena Leatherwood, FNP [address] [place],  [place] [address]  Primary Physician:  Olena Leatherwood, FNP  History of Present Illness: 08/30/2022 Ms. Borza presents today with low back pain as well as headache.  She has had multiple falls.  Her last fall was approximately 1 week ago.  She is at her baseline cognition.  She has no new pain down her legs.  Her mid back pain is stable.  01/30/2022 Ms. Lanika Palley is here today with a chief complaint of low back pain that radiates into the left leg pain that radiates into the foot. Also complains of right foot pain.  She has been having back trouble for many years.  Over the past several months, she suffered multiple compression fractures.  She is also had severe issues with nausea and vomiting.  She has times where she is unable to eat for more than a week at a time.  She reports pain as bad as 10 out of 10 made worse by walking and standing and lifting.  Laying down makes it better.   Bowel/Bladder Dysfunction: none  Conservative measures:  Physical therapy: has not participated in Multimodal medical therapy including regular antiinflammatories:  flexeril, lidocaine patch, oxycodone, tylenol Injections: has not received any epidural steroid injections  Past Surgery:  Thoracic cyst removal around 2010.   Madelyn Brunner has no symptoms of cervical myelopathy.  The symptoms are causing a significant impact on the patient's life.   Progress Note from Drake Leach, [place] on 12/14/21:  Chief Complaint:  f/u for multiple thoracic compression fractures and L3 compression fracture   History of Present Illness: DENESSA HUMPHRES is a 57 y.o. female who presents for multiple thoracic compression fractures and L3 compression fracture DOI 08/27/21.    Xrays at her last visit showed some progression of T12 fracture at last visit. She had not been wearing brace and was reminded to do this. MRI lumbar spine also ordered at last  visit.    She is here for follow up to review lumbar MRI and repeat thoracic/lumbar xrays.    She has been sick for last 2-3 weeks and was dry heaving. She has severe pain in her mid back. This is worse that it has been. No radiation of pain to arms or around chest. No weakness in arms/legs.    She has chronic LBP x years. Some pain in left lateral thigh, but she also had recent left hips surgery. She has numbness in her toes. She feels like her LBP is at her baseline.   Review of Systems:  A 10 point review of systems is negative, except for the pertinent positives and negatives detailed in the HPI.  Past Medical History: Past Medical History:  Diagnosis Date   Cancer (HCC)    cervical   COPD (chronic obstructive pulmonary disease) (HCC)    GERD (gastroesophageal reflux disease)    History of Helicobacter pylori infection    Hypertension    Menopausal state    Osteopenia    Postmenopausal atrophic vaginitis     Past Surgical History: Past Surgical History:  Procedure Laterality Date   BREAST BIOPSY Bilateral    neg   TOTAL HIP ARTHROPLASTY Left 12/22/2020   Procedure: TOTAL HIP ARTHROPLASTY ANTERIOR APPROACH;  Surgeon: Kennedy Bucker, MD;  Location: [facility] ORS;  Service: Orthopedics;  Laterality: Left;    Allergies: Allergies as of 08/30/2022 - Review Complete 02/20/2022  Allergen Reaction Noted  Demerol [meperidine] Nausea Only and Nausea And Vomiting 08/23/2014   Codeine Nausea Only 08/23/2014    Medications: No outpatient medications have been marked as taking for the 08/30/22 encounter (Appointment) with Venetia Night, MD.    Social History: Social History   Tobacco Use   Smoking status: Former    Packs/day: 1.00    Years: 30.00    Additional pack years: 0.00    Total pack years: 30.00    Types: Cigarettes   Smokeless tobacco: Never  Vaping Use   Vaping Use: Never used  Substance Use Topics   Alcohol use: Not Currently    Alcohol/week: 8.0 standard  drinks of alcohol    Types: 8 Cans of beer per week   Drug use: Yes    Types: Marijuana    Comment: CBD  gummy, prescribed oxy    Family Medical History: Family History  Problem Relation Age of Onset   Diabetes Mother    Hypertension Mother    Breast cancer Paternal Aunt 46   Hypertension Father     Physical Examination: There were no vitals filed for this visit.  General: Patient is well developed, well nourished, calm, collected, and in no apparent distress. Attention to examination is appropriate.  Neck:   Supple.  Full range of motion.  Respiratory: Patient is breathing without any difficulty.   NEUROLOGICAL:     Awake, alert, oriented to person, place, and time.  Speech is clear and fluent. Fund of knowledge is appropriate.   Cranial Nerves: Pupils equal round and reactive to light.  Facial tone is symmetric.  Facial sensation is symmetric. Shoulder shrug is symmetric. Tongue protrusion is midline.    ROM of spine: full.    Strength: Side Biceps Triceps Deltoid Interossei Grip Wrist Ext. Wrist Flex.  R 5 5 5 5 5 5 5   L 5 5 5 5 5 5 5    Side Iliopsoas Quads Hamstring PF DF EHL  R 5 5 5 5 5 5   L 5 5 5 5 5 5    Reflexes are 1+ and symmetric at the biceps, triceps, brachioradialis, patella and achilles.   Hoffman's is absent.   Bilateral upper and lower extremity sensation is intact to light touch.    No evidence of dysmetria noted.  Gait is normal.     Medical Decision Making  Imaging: MRI L spine 12/02/21 IMPRESSION: 1. Progressive loss of vertebral body height and progressive osseous retropulsion at the T12 fracture compared with CT of 08/25/2021. This fracture does not appear significantly changed from the more recent radiographs of 10/03/2021, but there is persistent associated bone marrow edema. 2. The mild fractures involving the superior endplate of T11 and L3 appear unchanged. No new fractures. 3. Superimposed spondylosis, increased from remote  MRI, but similar to more recent CTs. 4. Mild multifactorial spinal stenosis at L4-5 due to disc bulging, facet and ligamentous hypertrophy and prominent epidural fat. 5. Mild multifactorial spinal stenosis with osseous foraminal narrowing bilaterally at L5-S1.     Electronically Signed   By: Carey Bullocks M.D.   On: 12/04/2021 11:43  Scoliosis films 12/14/21 IMPRESSION: 1. Remote appearing superior endplate fractures of T10 and T11 with 10-20% loss of height, unchanged 2. Remote anterior wedge compression deformity of T12 with approximately 50% loss of height, unchanged. Stable 3-4 mm retropulsion of the posterosuperior aspect of T12. No listhesis. 3. 5 mm retrolisthesis L1 upon L2, likely degenerative in nature.     Electronically Signed   By: Gloris Ham  Ramiro Harvest M.D.   On: 12/17/2021 00:00  I have personally reviewed the images and agree with the above interpretation.  Assessment and Plan: Ms. Chaloupka is a pleasant 57 y.o. 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  She has osteopenia.  I do not think she is a good candidate for any surgical intervention.  I will repeat her imaging and send her for evaluation for injections.  I have stressed that she should be in close contact with her primary care provider.  She would like to change primary care providers.  I have put in a referral to the [facility] per her preference.  I have offered physical therapy evaluation but she declined.  I will see her back on an as-needed basis.    I spent a total of 30 minutes in face-to-face and non-face-to-face activities related to this patient's care today.  Thank you for involving me in the care of this patient.      Makyiah Lie K. Myer Haff MD, [facility] Neurosurgery

## 2022-09-11 ENCOUNTER — Ambulatory Visit: Payer: Medicare HMO | Admitting: Student in an Organized Health Care Education/Training Program

## 2022-09-30 IMAGING — CT CT ABD-PELV W/ CM
2 of 5 series · 15 of 46 positions shown, 17 images · IV contrast (agent unspecified)
Comparison: None Available.

CLINICAL DATA: Abdominal bloating and nausea

EXAM:
CT ABDOMEN AND PELVIS WITH CONTRAST
TECHNIQUE: Multidetector CT imaging of the abdomen and pelvis was performed
using the standard protocol following bolus administration of
intravenous contrast.

[Series 2: abd pelvis (person_name) 5.00 · axial · 0.68mm/px · z∈[-1521,-1091]mm · 12 of 98 slices shown, 14 images]
[im 6/98  soft-tissue]
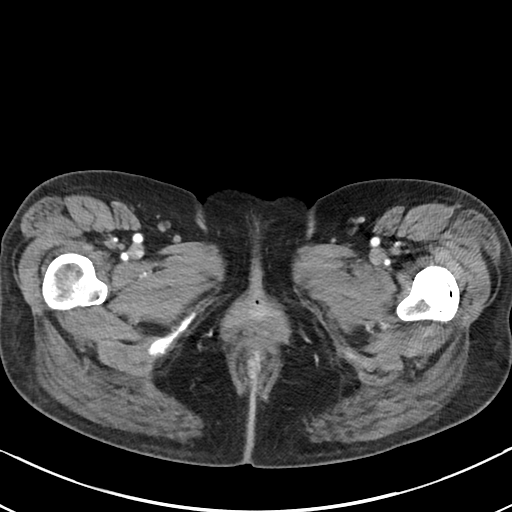
[im 6/98  bone]
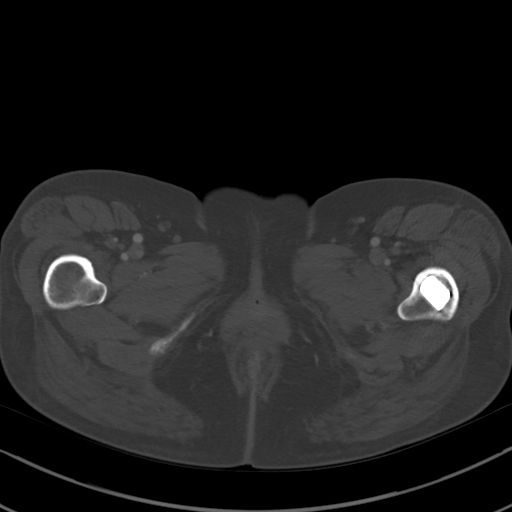
[im 17/98  soft-tissue]
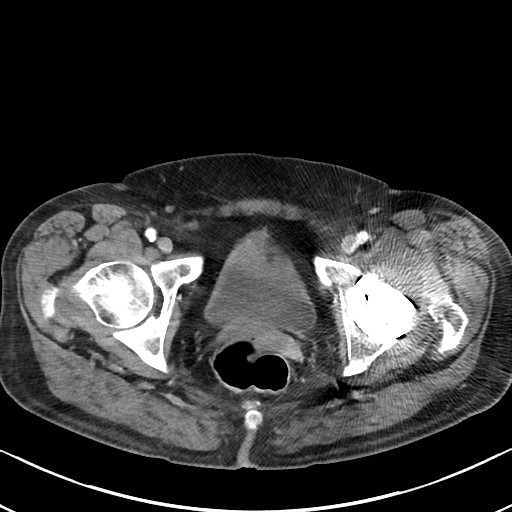
[im 22/98  soft-tissue]
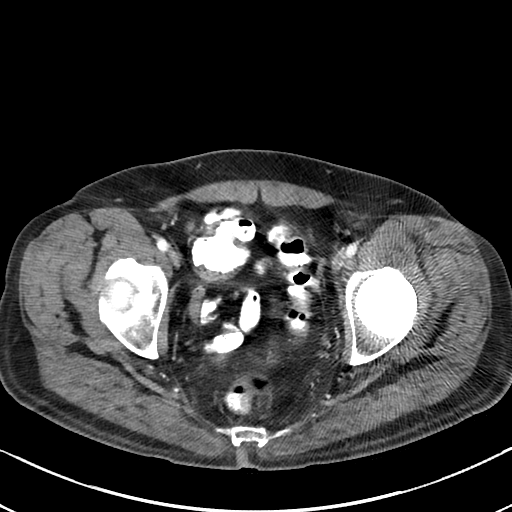
[im 27/98  soft-tissue]
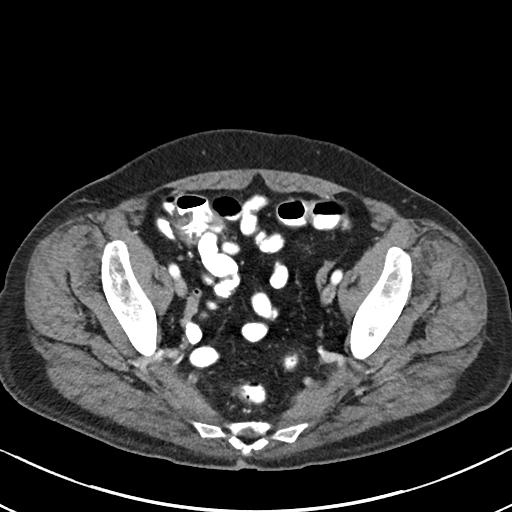
[im 38/98  soft-tissue]
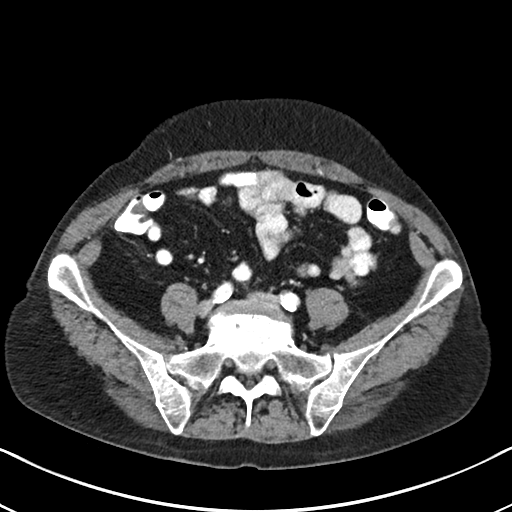
[im 44/98  soft-tissue]
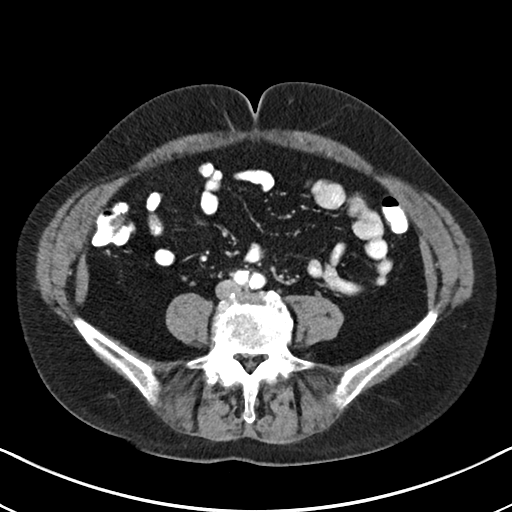
[im 54/98  soft-tissue]
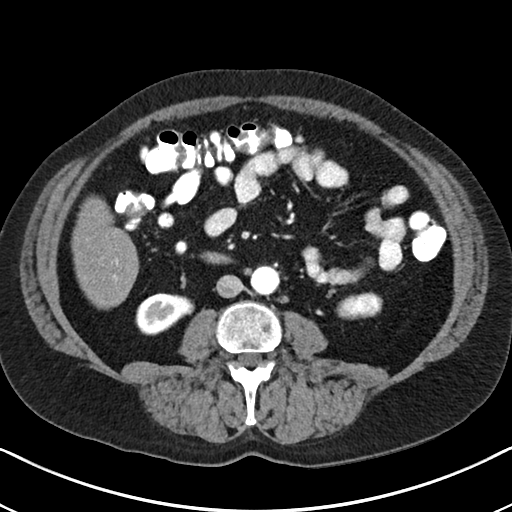
[im 60/98  soft-tissue]
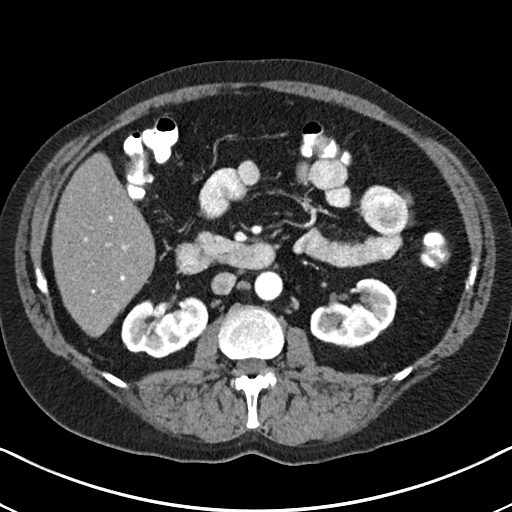
[im 71/98  soft-tissue]
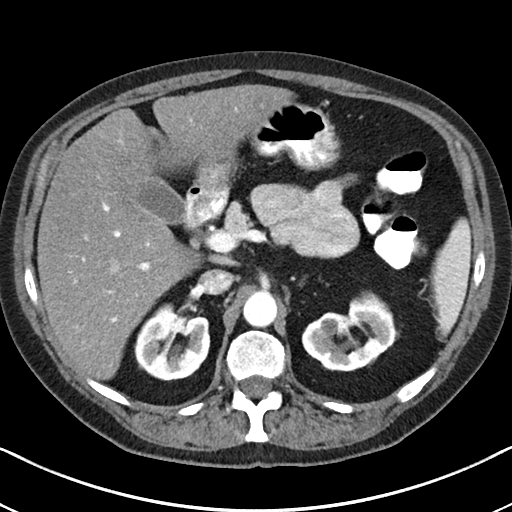
[im 71/98  bone]
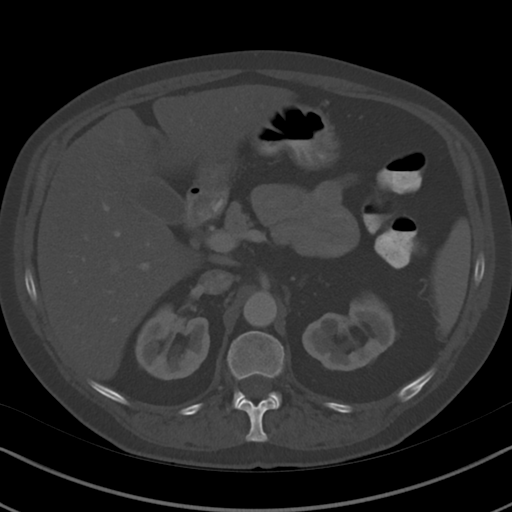
[im 76/98  soft-tissue]
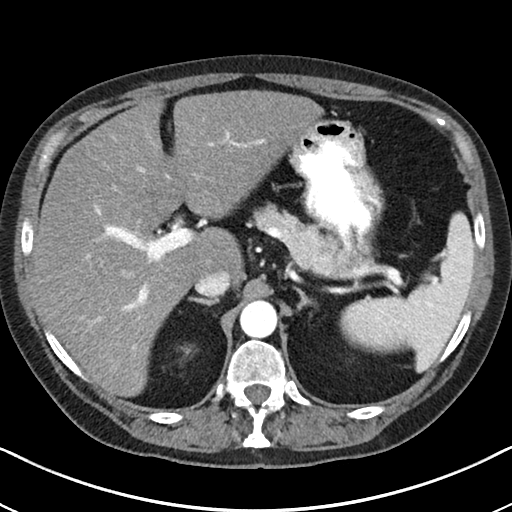
[im 81/98  soft-tissue]
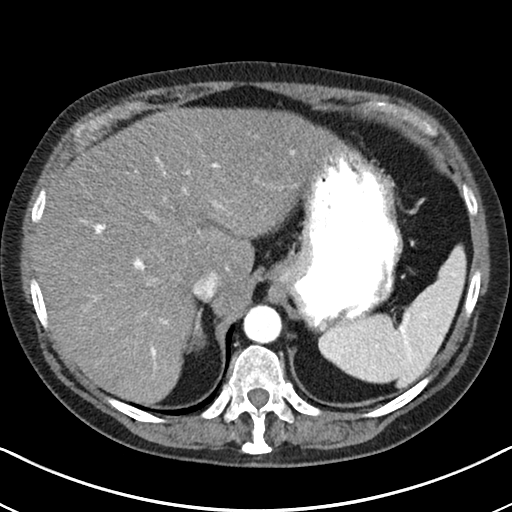
[im 92/98  soft-tissue]
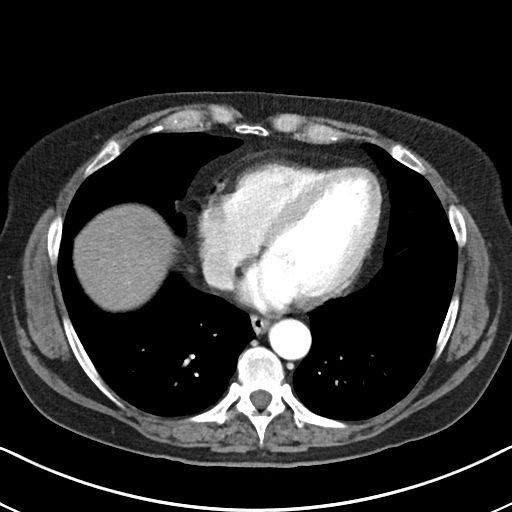

[Series 4: coronals abd pelvis (person_name) 2.00 cor · coronal · 0.68mm/px · 3 of 144 slices shown]
[im 48/144  soft-tissue]
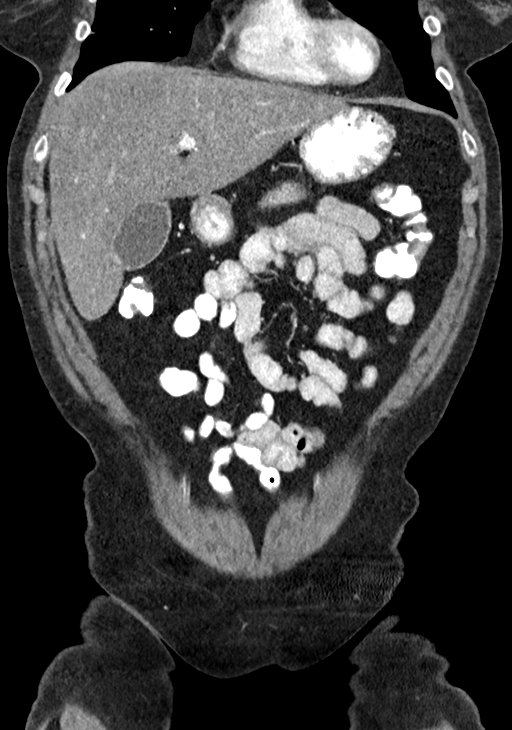
[im 64/144  soft-tissue]
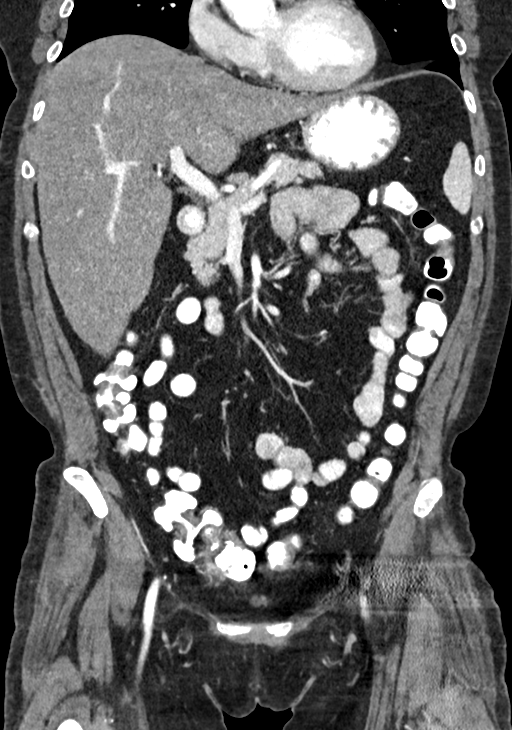
[im 80/144  soft-tissue]
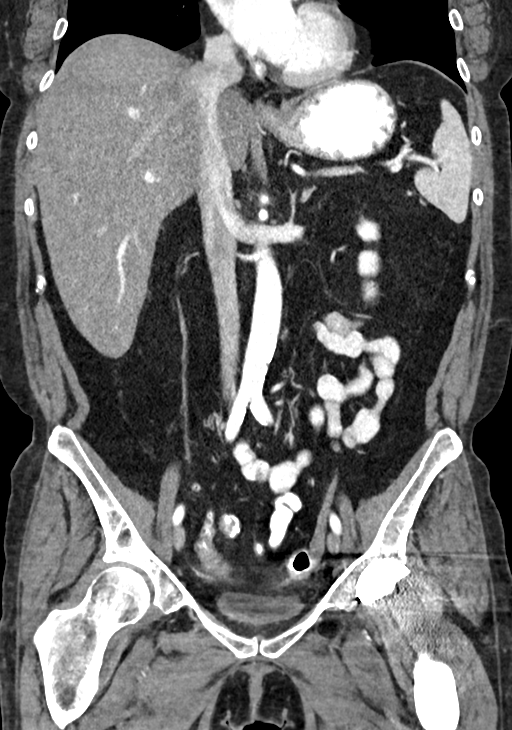

[15 of 46 positions shown; findings below may reference images not displayed]

RADIATION DOSE REDUCTION: This exam was performed according to the
departmental dose-optimization program which includes automated
exposure control, adjustment of the mA and/or kV according to
patient size and/or use of iterative reconstruction technique.

CONTRAST:  100mL OMNIPAQUE IOHEXOL 300 MG/ML  SOLN
FINDINGS: Lower chest: No acute abnormality.

Hepatobiliary: Liver is enlarged measuring 22.3 cm in length with
hypodense appearance of the parenchyma suggesting steatosis. No
focal mass identified. Gallbladder contains small densities
consistent with cholelithiasis. No biliary ductal dilatation.

Pancreas: Unremarkable. No pancreatic ductal dilatation or
surrounding inflammatory changes.

Spleen: Normal in size without focal abnormality.

Adrenals/Urinary Tract: Adrenal glands are normal. 7 mm calculus in
the mid left kidney. Renal cortical thinning and mild lobulation
bilaterally. A few small cysts in the left kidney. No hydronephrosis
or enhancing renal mass identified. Urinary bladder appears normal.

Stomach/Bowel: No bowel obstruction, free air or pneumatosis. No
bowel wall edema identified. Appendix is normal.

Vascular/Lymphatic: Moderate to severe atherosclerotic disease. No
bulky lymphadenopathy visualized.

Reproductive: Uterus and bilateral adnexa are unremarkable.

Other: No ascites.

Musculoskeletal: Advanced degenerative changes at L5-S1. Left hip
prosthesis. No suspicious bony lesions identified.
IMPRESSION: 1. No acute process identified.
2. Hepatomegaly and evidence of hepatic steatosis.
3. Cholelithiasis.
4. Left renal calculus. Bilateral renal cortical thinning and
lobulation.

## 2022-10-08 IMAGING — CT CT ABD-PELV W/ CM
2 of 5 series · 16 of 46 positions shown, 18 images · IV contrast (APPLIED)
Comparison: 08/02/2021

CLINICAL DATA: Left upper quadrant abdominal discomfort. Vomiting
and diarrhea. Symptoms for 2 months, worsening over the past 2 days.

EXAM:
CT ABDOMEN AND PELVIS WITH CONTRAST
TECHNIQUE: Multidetector CT imaging of the abdomen and pelvis was performed
using the standard protocol following bolus administration of
intravenous contrast.

[Series 2: abdomen 5.0 · axial · 0.83mm/px · z∈[-1447,-1047]mm · 13 of 94 slices shown, 15 images]
[im 7/94  soft-tissue]
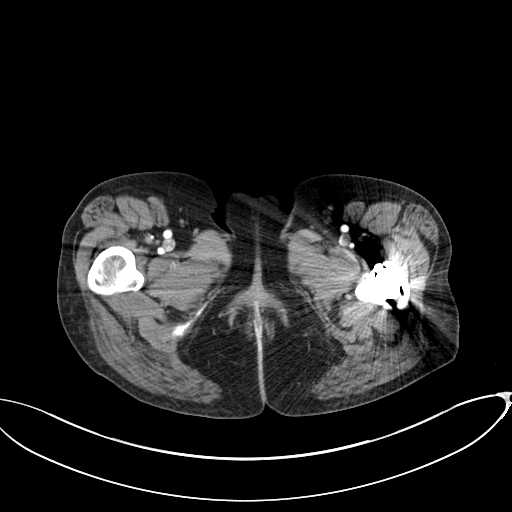
[im 7/94  bone]
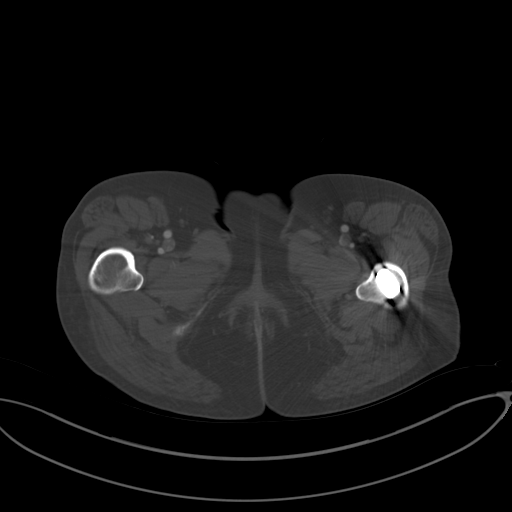
[im 13/94  soft-tissue]
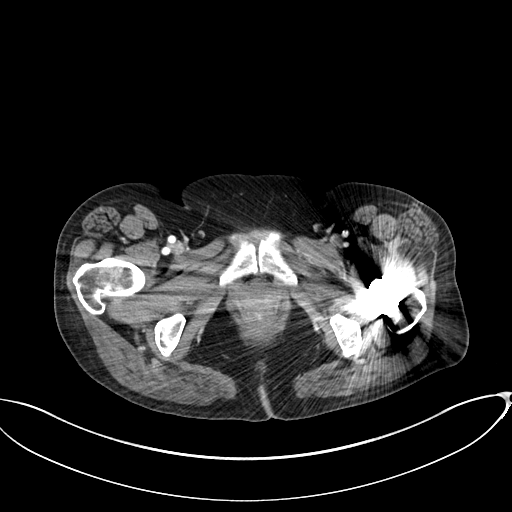
[im 19/94  soft-tissue]
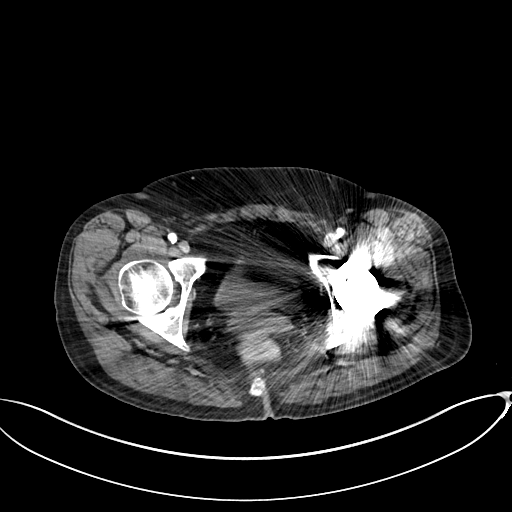
[im 25/94  soft-tissue]
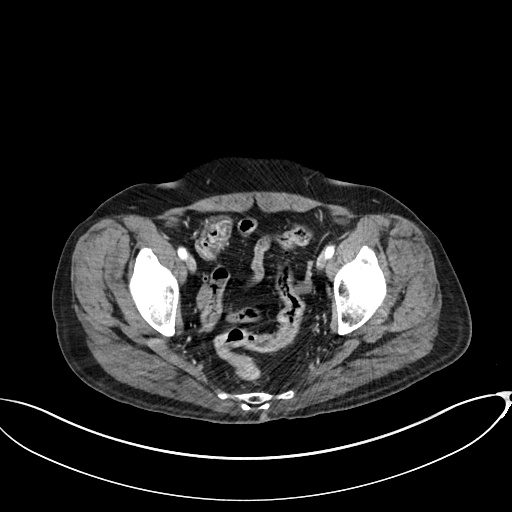
[im 32/94  soft-tissue]
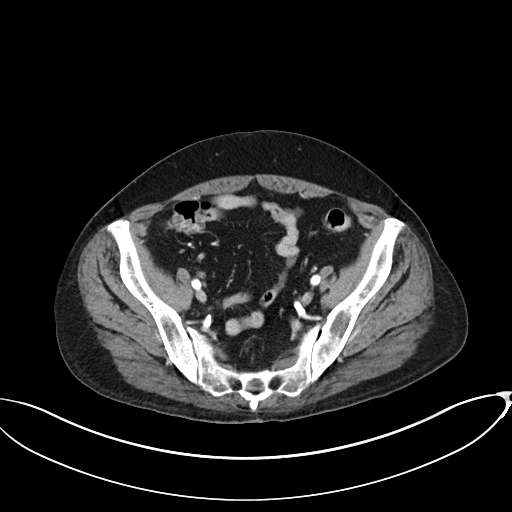
[im 38/94  soft-tissue]
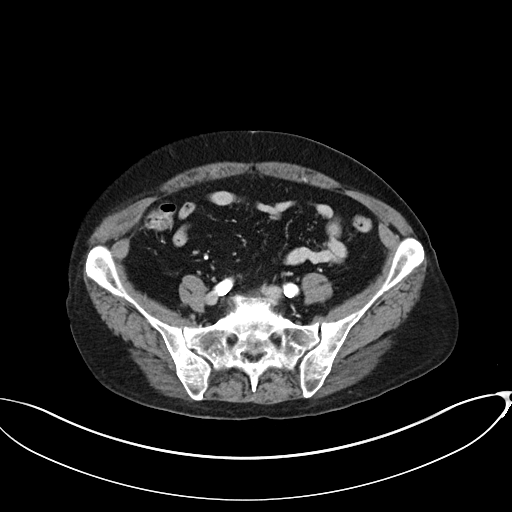
[im 50/94  soft-tissue]
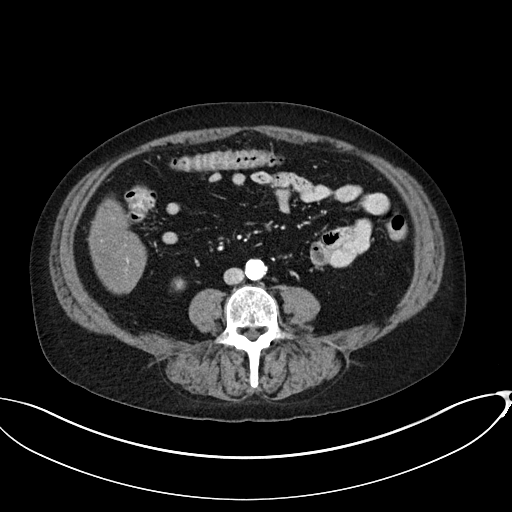
[im 56/94  soft-tissue]
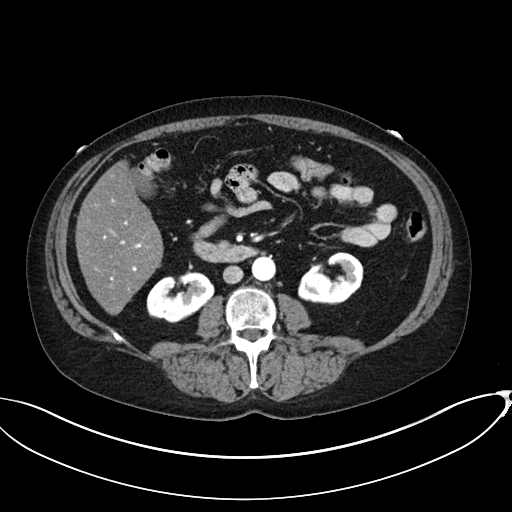
[im 63/94  soft-tissue]
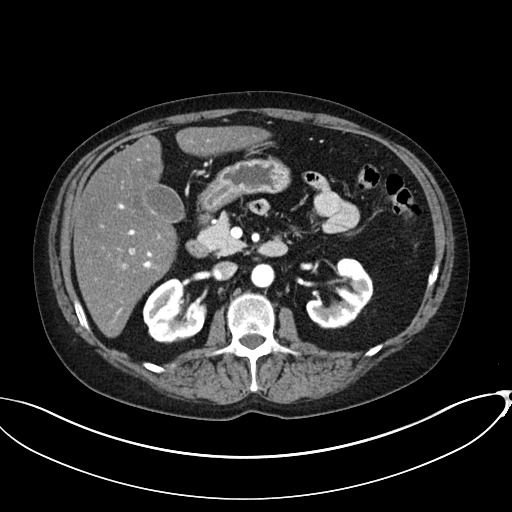
[im 63/94  bone]
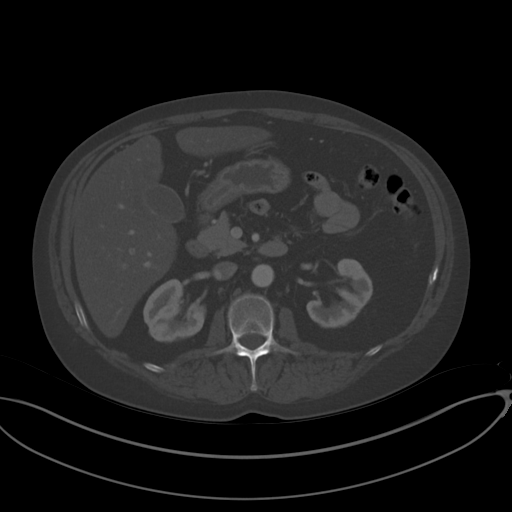
[im 69/94  soft-tissue]
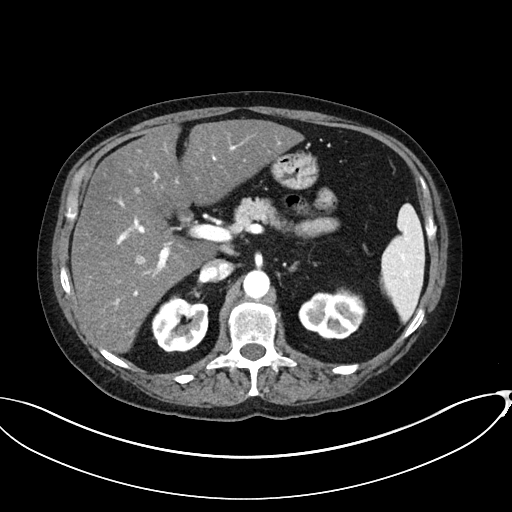
[im 75/94  soft-tissue]
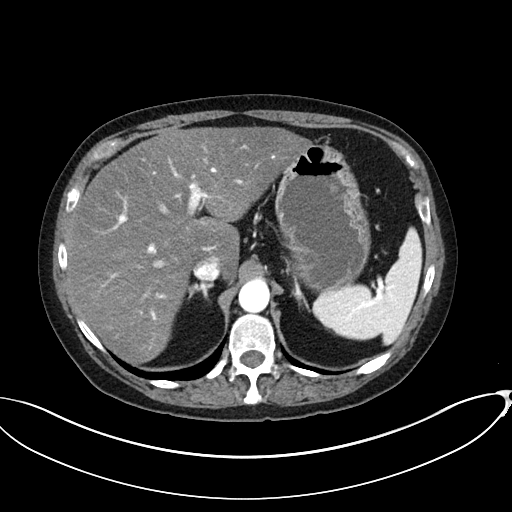
[im 81/94  soft-tissue]
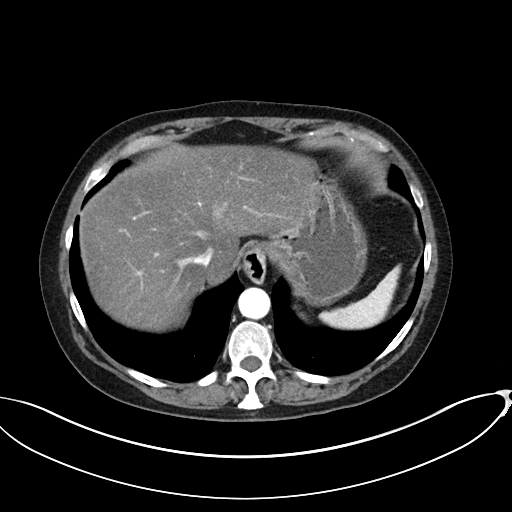
[im 87/94  soft-tissue]
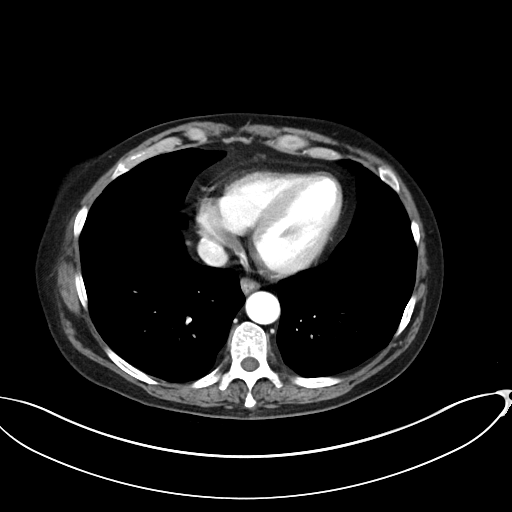

[Series 5: abdomen 3.0 mpr cor · coronal · 0.77mm/px · 3 of 90 slices shown]
[im 30/90  soft-tissue]
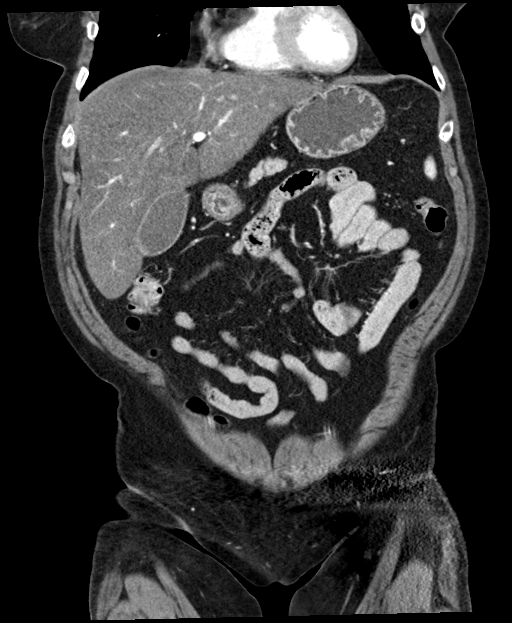
[im 40/90  soft-tissue]
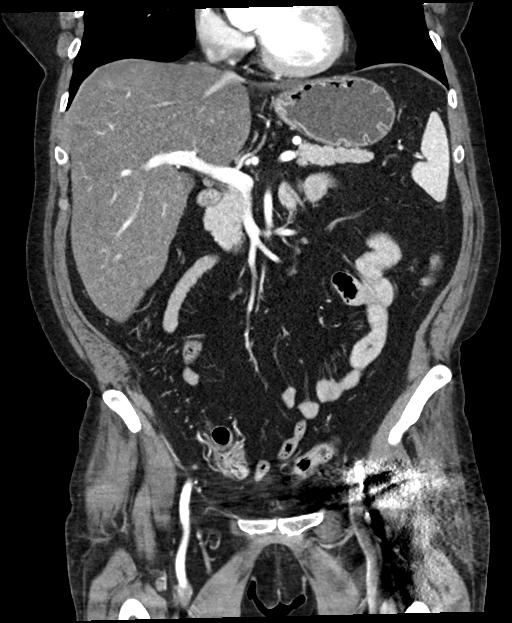
[im 50/90  soft-tissue]
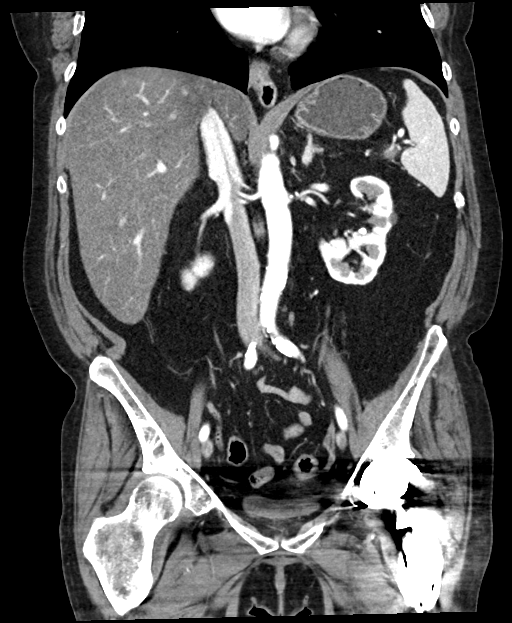

[16 of 46 positions shown; findings below may reference images not displayed]

RADIATION DOSE REDUCTION: This exam was performed according to the
departmental dose-optimization program which includes automated
exposure control, adjustment of the mA and/or kV according to
patient size and/or use of iterative reconstruction technique.

CONTRAST:  100mL OMNIPAQUE IOHEXOL 300 MG/ML  SOLN
FINDINGS: Lower chest: Emphysema. Normal heart size without pericardial or
pleural effusion.

Hepatobiliary: Hepatomegaly at 20.3 cm craniocaudal. Marked hepatic
steatosis. Small gallstones without acute cholecystitis or biliary
duct dilatation.

Pancreas: Normal, without mass or ductal dilatation.

Spleen: Normal in size, without focal abnormality.

Adrenals/Urinary Tract: Normal adrenal glands. Mild bilateral renal
cortical scarring. Too small to characterize lesions within the left
kidney. Interpolar left renal 6 mm collecting system calculus. No
hydronephrosis. Degraded evaluation of the pelvis, secondary to beam
hardening artifact from left hip arthroplasty. Grossly normal
urinary bladder.

Stomach/Bowel: Tiny hiatal hernia. Apparent mild gastric antral wall
thickening on [DATE] is favored to be due to underdistention. Normal
colon and terminal ileum. Normal appendix, including on 62/2. Normal
small bowel.

Vascular/Lymphatic: Aortic atherosclerosis. Multiple renal arteries
bilaterally. No abdominopelvic adenopathy.

Reproductive: Normal uterus and adnexa.

Other: No significant free fluid.  No free intraperitoneal air.

Musculoskeletal: Left hip arthroplasty. Advanced degenerative disc
disease at the lumbosacral junction.
IMPRESSION: 1. No acute process in the abdomen or pelvis.
2. Hepatomegaly and hepatic steatosis.
3. Cholelithiasis.
4. Left nephrolithiasis.
5. Tiny hiatal hernia.
6. Degraded evaluation of the pelvis, secondary to beam hardening
artifact from left hip arthroplasty.
7. Aortic Atherosclerosis (4PSAS-2Q3.3) and Emphysema (4PSAS-0VG.5).

## 2022-12-12 ENCOUNTER — Emergency Department
Admission: EM | Admit: 2022-12-12 | Discharge: 2022-12-12 | Disposition: A | Discharge status: Home / Self Care | Payer: Medicare HMO | Attending: Emergency Medicine | Admitting: Emergency Medicine

## 2022-12-12 ENCOUNTER — Other Ambulatory Visit: Payer: Self-pay

## 2022-12-12 DIAGNOSIS — D72829 Elevated white blood cell count, unspecified: Secondary | ICD-10-CM | POA: Insufficient documentation

## 2022-12-12 DIAGNOSIS — I10 Essential (primary) hypertension: Secondary | ICD-10-CM | POA: Insufficient documentation

## 2022-12-12 DIAGNOSIS — K529 Noninfective gastroenteritis and colitis, unspecified: Secondary | ICD-10-CM | POA: Diagnosis not present

## 2022-12-12 DIAGNOSIS — J449 Chronic obstructive pulmonary disease, unspecified: Secondary | ICD-10-CM | POA: Diagnosis not present

## 2022-12-12 DIAGNOSIS — R140 Abdominal distension (gaseous): Secondary | ICD-10-CM

## 2022-12-12 DIAGNOSIS — R1110 Vomiting, unspecified: Secondary | ICD-10-CM | POA: Diagnosis present

## 2022-12-12 LAB — CBC
HCT: 36.9 % (ref 36.0–46.0)
Hemoglobin: 12.2 g/dL (ref 12.0–15.0)
MCH: 34.7 pg — ABNORMAL HIGH (ref 26.0–34.0)
MCHC: 33.1 g/dL (ref 30.0–36.0)
MCV: 104.8 fL — ABNORMAL HIGH (ref 80.0–100.0)
Platelets: 288 10*3/uL (ref 150–400)
RBC: 3.52 MIL/uL — ABNORMAL LOW (ref 3.87–5.11)
RDW: 14.6 % (ref 11.5–15.5)
WBC: 11.6 10*3/uL — ABNORMAL HIGH (ref 4.0–10.5)
nRBC: 0 % (ref 0.0–0.2)

## 2022-12-12 LAB — COMPREHENSIVE METABOLIC PANEL WITH GFR
ALT: 19 U/L (ref 0–44)
AST: 99 U/L — ABNORMAL HIGH (ref 15–41)
Albumin: 2.8 g/dL — ABNORMAL LOW (ref 3.5–5.0)
Alkaline Phosphatase: 190 U/L — ABNORMAL HIGH (ref 38–126)
Anion gap: 10 (ref 5–15)
BUN: 7 mg/dL (ref 6–20)
CO2: 20 mmol/L — ABNORMAL LOW (ref 22–32)
Calcium: 8.9 mg/dL (ref 8.9–10.3)
Chloride: 105 mmol/L (ref 98–111)
Creatinine, Ser: 0.71 mg/dL (ref 0.44–1.00)
GFR, Estimated: >60 mL/min (ref >60)
Glucose, Bld: 103 mg/dL — ABNORMAL HIGH (ref 70–99)
Potassium: 3.2 mmol/L — ABNORMAL LOW (ref 3.5–5.1)
Sodium: 135 mmol/L (ref 135–145)
Total Bilirubin: 4.9 mg/dL — ABNORMAL HIGH (ref 0.3–1.2)
Total Protein: 6.8 g/dL (ref 6.5–8.1)

## 2022-12-12 LAB — LIPASE, BLOOD: Lipase: 28 U/L (ref 11–51)

## 2022-12-12 LAB — MAGNESIUM: Magnesium: 1.3 mg/dL — ABNORMAL LOW (ref 1.7–2.4)

## 2022-12-12 MED ORDER — POTASSIUM CHLORIDE CRYS ER 20 MEQ PO TBCR
40.0000 meq | EXTENDED_RELEASE_TABLET | Freq: Once | ORAL | Status: AC
  Administered 2022-12-12: 40 meq via ORAL
  Filled 2022-12-12: qty 2

## 2022-12-12 MED ORDER — LOPERAMIDE HCL 2 MG PO CAPS
2.0000 mg | ORAL_CAPSULE | Freq: Once | ORAL | Status: AC
  Administered 2022-12-12: 2 mg via ORAL
  Filled 2022-12-12: qty 1

## 2022-12-12 MED ORDER — ONDANSETRON HCL 4 MG/2ML IJ SOLN
4.0000 mg | Freq: Once | INTRAMUSCULAR | Status: AC
  Administered 2022-12-12: 4 mg via INTRAVENOUS
  Filled 2022-12-12: qty 2

## 2022-12-12 MED ORDER — LOPERAMIDE HCL 2 MG PO CAPS: 2.0000 mg | ORAL_CAPSULE | ORAL | 0 refills | Status: AC | PRN

## 2022-12-12 MED ORDER — ONDANSETRON 4 MG PO TBDP: 4.0000 mg | ORAL_TABLET | Freq: Three times a day (TID) | ORAL | 0 refills | Status: DC | PRN

## 2022-12-12 MED ORDER — SODIUM CHLORIDE 0.9 % IV BOLUS
500.0000 mL | Freq: Once | INTRAVENOUS | Status: AC
  Administered 2022-12-12: 500 mL via INTRAVENOUS

## 2022-12-12 MED ORDER — MAGNESIUM SULFATE 2 GM/50ML IV SOLN
2.0000 g | Freq: Once | INTRAVENOUS | Status: AC
  Administered 2022-12-12: 2 g via INTRAVENOUS
  Filled 2022-12-12: qty 50

## 2022-12-12 NOTE — ED Triage Notes (Signed)
Pt c/o diarrhea and vomiting since Sunday. Pt reports 10 episodes of diarrhea this morning. She says she threw up once last night and once this morning. Pt c/o L side abdominal pain.

## 2022-12-12 NOTE — ED Provider Notes (Signed)
[facility] Provider Note    Event Date/Time   First MD Initiated Contact with Patient 12/12/22 1707     (approximate)   History   Chief Complaint Vomiting and Diarrhea   HPI  Shirley Evans is a 57 y.o. female with past medical history of hypertension, COPD, alcohol abuse, and abnormal LFTs who presents to the ED complaining of vomiting and diarrhea.  Patient reports that for the past 2 days she has been dealing with persistent nausea, vomiting, and yellowish watery diarrhea.  She denies any associated pain in her abdomen but has noticed that she has had increased distention from her baseline.  She has not had any fevers and denies any blood in her emesis or stool.  She does report prior history of liver problems, admits to ongoing occasional alcohol consumption including a couple of beers each of the past 2 nights.  She states that her PCP is in the process of getting her referral to a GI doctor for her liver problems.     Physical Exam   Triage Vital Signs: ED Triage Vitals  Encounter Vitals Group     BP 12/12/22 1534 (!) 152/111     Systolic BP Percentile --      Diastolic BP Percentile --      Pulse Rate 12/12/22 1534 (!) 109     Resp 12/12/22 1534 (!) 24     Temp 12/12/22 1534 98 F (36.7 C)     Temp Source 12/12/22 1534 Oral     SpO2 12/12/22 1534 100 %     Weight 12/12/22 1536 149 lb (67.6 kg)     Height 12/12/22 1536 5\' 3"  (1.6 m)     Head Circumference --      Peak Flow --      Pain Score 12/12/22 1536 8     Pain Loc --      Pain Education --      Exclude from Growth Chart --     Most recent vital signs: Vitals:   12/12/22 1534  BP: (!) 152/111  Pulse: (!) 109  Resp: (!) 24  Temp: 98 F (36.7 C)  SpO2: 100%    Constitutional: Alert and oriented. Eyes: Conjunctivae are normal. Head: Atraumatic. Nose: No congestion/rhinnorhea. Mouth/Throat: Mucous membranes are moist.  Cardiovascular: Normal rate, regular rhythm. Grossly  normal heart sounds.  2+ radial pulses bilaterally. Respiratory: Normal respiratory effort.  No retractions. Lungs CTAB. Gastrointestinal: Soft and nontender.  Mild distention diffusely. Musculoskeletal: No lower extremity tenderness nor edema.  Neurologic:  Normal speech and language. No gross focal neurologic deficits are appreciated.    ED Results / Procedures / Treatments   Labs (all labs ordered are listed, but only abnormal results are displayed) Labs Reviewed  COMPREHENSIVE METABOLIC PANEL - Abnormal; Notable for the following components:      Result Value   Potassium 3.2 (*)    CO2 20 (*)    Glucose, Bld 103 (*)    Albumin 2.8 (*)    AST 99 (*)    Alkaline Phosphatase 190 (*)    Total Bilirubin 4.9 (*)    All other components within normal limits  CBC - Abnormal; Notable for the following components:   WBC 11.6 (*)    RBC 3.52 (*)    MCV 104.8 (*)    MCH 34.7 (*)    All other components within normal limits  MAGNESIUM - Abnormal; Notable for the following components:   Magnesium  1.3 (*)    All other components within normal limits  LIPASE, BLOOD  URINALYSIS, ROUTINE W REFLEX MICROSCOPIC    PROCEDURES:  Critical Care performed: No  Procedures   MEDICATIONS ORDERED IN ED: Medications  magnesium sulfate IVPB 2 g 50 mL (2 g Intravenous New Bag/Given 12/12/22 1830)  sodium chloride 0.9 % bolus 500 mL (500 mLs Intravenous New Bag/Given 12/12/22 1830)  potassium chloride SA (KLOR-CON M) CR tablet 40 mEq (40 mEq Oral Given 12/12/22 1831)  ondansetron (ZOFRAN) injection 4 mg (4 mg Intravenous Given 12/12/22 1831)     IMPRESSION / MDM / ASSESSMENT AND PLAN / ED COURSE  I reviewed the triage vital signs and the nursing notes.                              57 y.o. female with past medical history of hypertension, COPD, alcohol abuse, and abnormal LFTs who presents to the ED with persistent nausea, vomiting, and diarrhea over the past 2 days with increased abdominal  distention.  Patient's presentation is most consistent with acute presentation with potential threat to life or bodily function.  Differential diagnosis includes, but is not limited to, gastroenteritis, dehydration, lecture abnormality, AKI, GI bleed, SBP, ascites.  Patient nontoxic-appearing and in no acute distress, vital signs remarkable for mild tachycardia but otherwise reassuring.  Her abdomen is soft and nontender, but diffusely distended.  Bedside ultrasound shows small to moderate amount of ascites however with no pain or other concerning findings, low suspicion for SBP at this time.  Patient has known longstanding history of hepatic steatosis and alcohol abuse with abnormal LFTs.  Labs today show LFTs abnormalities with bilirubin slightly elevated at 4.9 compared to 4.110 months ago, transaminases improved compared to previous.  No significant anemia or leukocytosis noted, no AKI but patient does have mild hypokalemia and hypomagnesemia.  We will replete potassium and magnesium, hydrate with IV fluids and give IV Zofran.  Patient feeling better following Zofran and fluids, tolerating oral intake without difficulty.  She is appropriate for outpatient follow-up with her PCP as well as GI for her liver disease.  She was counseled to avoid alcohol consumption and to return to the ED for new or worsening symptoms, patient agrees with plan.    FINAL CLINICAL IMPRESSION(S) / ED DIAGNOSES   Final diagnoses:  Gastroenteritis  Abdominal distension     Rx / DC Orders   ED Discharge Orders          Ordered    ondansetron (ZOFRAN-ODT) 4 MG disintegrating tablet  Every 8 hours PRN        12/12/22 1936    loperamide (IMODIUM) 2 MG capsule  As needed        12/12/22 1936             Note:  This document was prepared using Dragon voice recognition software and may include unintentional dictation errors.   Chesley Noon, MD 12/12/22 [id number]

## 2022-12-13 ENCOUNTER — Ambulatory Visit: Payer: Medicare HMO | Admitting: Student in an Organized Health Care Education/Training Program

## 2022-12-17 ENCOUNTER — Telehealth: Payer: Self-pay

## 2022-12-17 NOTE — Telephone Encounter (Signed)
PT  call wants to come to [facility] for 2nd opinion. Pt is a PT of [facility] . I explain to pt that right now we aren't giving 2nd opinion. We are working on getting pt in that hasn't seen a GI md.

## 2022-12-19 ENCOUNTER — Other Ambulatory Visit: Payer: Self-pay | Admitting: Nurse Practitioner

## 2022-12-19 DIAGNOSIS — K760 Fatty (change of) liver, not elsewhere classified: Secondary | ICD-10-CM

## 2022-12-19 DIAGNOSIS — R748 Abnormal levels of other serum enzymes: Secondary | ICD-10-CM

## 2022-12-19 DIAGNOSIS — F1020 Alcohol dependence, uncomplicated: Secondary | ICD-10-CM

## 2022-12-19 DIAGNOSIS — K7031 Alcoholic cirrhosis of liver with ascites: Secondary | ICD-10-CM

## 2022-12-20 ENCOUNTER — Ambulatory Visit
Admission: RE | Admit: 2022-12-20 | Discharge: 2022-12-20 | Disposition: A | Discharge status: Home / Self Care | Payer: Medicare HMO | Source: Ambulatory Visit | Attending: Nurse Practitioner | Admitting: Nurse Practitioner

## 2022-12-20 DIAGNOSIS — K760 Fatty (change of) liver, not elsewhere classified: Secondary | ICD-10-CM | POA: Insufficient documentation

## 2022-12-20 DIAGNOSIS — R748 Abnormal levels of other serum enzymes: Secondary | ICD-10-CM | POA: Diagnosis present

## 2022-12-20 DIAGNOSIS — F1020 Alcohol dependence, uncomplicated: Secondary | ICD-10-CM | POA: Diagnosis present

## 2022-12-20 DIAGNOSIS — K7031 Alcoholic cirrhosis of liver with ascites: Secondary | ICD-10-CM | POA: Diagnosis present

## 2022-12-20 LAB — ALBUMIN, PLEURAL OR PERITONEAL FLUID: Albumin, Fluid: <1.5 g/dL

## 2022-12-20 LAB — BODY FLUID CULTURE W GRAM STAIN

## 2022-12-20 LAB — BODY FLUID CELL COUNT WITH DIFFERENTIAL
Eos, Fluid: 0 %
Lymphs, Fluid: 57 %
Monocyte-Macrophage-Serous Fluid: 38 %
Neutrophil Count, Fluid: 5 %
Total Nucleated Cell Count, Fluid: 452 cu mm

## 2022-12-20 MED ORDER — LIDOCAINE HCL (PF) 1 % IJ SOLN
10.0000 mL | Freq: Once | INTRAMUSCULAR | Status: AC
  Administered 2022-12-20: 10 mL via INTRADERMAL
  Filled 2022-12-20: qty 10

## 2022-12-20 NOTE — Procedures (Signed)
PROCEDURE SUMMARY:  Successful US guided paracentesis from RLQ.  Yielded 250 mL of clear yellow fluid.  No immediate complications.  Pt tolerated well.   Specimen was sent for labs.  EBL < 1mL  Berneta Levins PA-C 12/20/2022 11:51 AM

## 2022-12-21 LAB — PROTEIN, PLEURAL OR PERITONEAL FLUID: Total protein, fluid: <3 g/dL

## 2022-12-21 LAB — CYTOLOGY - NON PAP

## 2022-12-23 LAB — BODY FLUID CULTURE W GRAM STAIN: Culture: NO GROWTH

## 2023-01-04 ENCOUNTER — Other Ambulatory Visit: Payer: Self-pay | Admitting: Nurse Practitioner

## 2023-01-04 DIAGNOSIS — K7031 Alcoholic cirrhosis of liver with ascites: Secondary | ICD-10-CM

## 2023-01-04 DIAGNOSIS — R748 Abnormal levels of other serum enzymes: Secondary | ICD-10-CM

## 2023-01-04 DIAGNOSIS — K760 Fatty (change of) liver, not elsewhere classified: Secondary | ICD-10-CM

## 2023-01-15 ENCOUNTER — Encounter: Payer: Self-pay | Admitting: Student in an Organized Health Care Education/Training Program

## 2023-01-15 ENCOUNTER — Ambulatory Visit
Payer: Medicare HMO | Attending: Student in an Organized Health Care Education/Training Program | Admitting: Student in an Organized Health Care Education/Training Program

## 2023-01-15 VITALS — BP 147/93 | HR 82 | Temp 97.7°F | Ht 64.0 in | Wt 139.0 lb

## 2023-01-15 DIAGNOSIS — M48062 Spinal stenosis, lumbar region with neurogenic claudication: Secondary | ICD-10-CM

## 2023-01-15 DIAGNOSIS — M51362 Other intervertebral disc degeneration, lumbar region with discogenic back pain and lower extremity pain: Secondary | ICD-10-CM | POA: Diagnosis not present

## 2023-01-15 DIAGNOSIS — G894 Chronic pain syndrome: Secondary | ICD-10-CM

## 2023-01-15 DIAGNOSIS — G8929 Other chronic pain: Secondary | ICD-10-CM | POA: Diagnosis present

## 2023-01-15 DIAGNOSIS — M5416 Radiculopathy, lumbar region: Secondary | ICD-10-CM | POA: Diagnosis not present

## 2023-01-15 DIAGNOSIS — M47816 Spondylosis without myelopathy or radiculopathy, lumbar region: Secondary | ICD-10-CM

## 2023-01-15 NOTE — Progress Notes (Signed)
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 02/02/2022   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   Intractable vomiting 02/12/2022   Lumbar facet arthropathy 01/15/2023   Lumbar radiculopathy 01/15/2023   Degeneration of intervertebral disc of lumbar region with discogenic back pain and lower extremity pain 01/15/2023   Spinal stenosis, lumbar region, with neurogenic claudication 01/15/2023   Chronic pain syndrome 01/15/2023   Resolved Ambulatory Problems    Diagnosis Date Noted   No Resolved Ambulatory Problems   Past Medical History:  Diagnosis Date   Cancer (HCC)    COPD (chronic obstructive pulmonary disease) (HCC)    History of Helicobacter pylori infection    Menopausal state    Osteopenia    Postmenopausal atrophic vaginitis    Constitutional Exam  General appearance: Well  nourished, well developed, and well hydrated. In no apparent acute distress Vitals:   01/15/23 0950  BP: (!) 147/93  Pulse: 82  Temp: 97.7 F (36.5 C)  SpO2: 100%  Weight: 139 lb (63 kg)  Height: 5\' 4"  (1.626 m)   BMI Assessment: Estimated body mass index is 23.86 kg/m as calculated from the following:   Height as of this encounter: 5\' 4"  (1.626 m).   Weight as of this encounter: 139 lb (63 kg).  BMI interpretation table: BMI level Category Range association with higher incidence of chronic pain  <18 kg/m2 Underweight   18.5-24.9 kg/m2 Ideal body weight   25-29.9 kg/m2 Overweight Increased incidence by 20%  30-34.9 kg/m2 Obese (Class I) Increased incidence by 68%  35-39.9 kg/m2 Severe obesity (Class II) Increased incidence by 136%  >40 kg/m2 Extreme obesity (Class III) Increased incidence by 254%   Patient's current BMI Ideal Body weight  Body mass index is 23.86 kg/m. Ideal body weight: 54.7 kg (120 lb 9.5 oz) Adjusted ideal body weight: 58 kg (127 lb 15.3 oz)   BMI Readings from Last 4 Encounters:  01/15/23 23.86 kg/m  12/12/22 26.39 kg/m  08/30/22 24.48 kg/m  02/20/22 23.88 kg/m   Wt Readings from Last 4 Encounters:  01/15/23 139 lb (63 kg)  12/12/22 149 lb (67.6 kg)  08/30/22 138 lb 3.2 oz (62.7 kg)  02/20/22 134 lb 12.8 oz (61.1 kg)    Psych/Mental status: Alert, oriented x 3 (person, place, &  time)       Eyes: PERLA Respiratory: No evidence of acute respiratory distress  Thoracic Spine Area Exam  Skin & Axial Inspection: No masses, redness, or swelling Alignment: Symmetrical Functional ROM: Pain restricted ROM Stability: No instability detected Muscle Tone/Strength: Functionally intact. No obvious neuro-muscular anomalies detected. Sensory (Neurological): Musculoskeletal pain pattern Muscle strength & Tone: No palpable anomalies Lumbar Spine Area Exam  Skin & Axial Inspection: No masses, redness, or swelling Alignment: Symmetrical Functional ROM:  Pain restricted ROM       Stability: No instability detected Muscle Tone/Strength: Functionally intact. No obvious neuro-muscular anomalies detected. Sensory (Neurological): Dermatomal pain pattern Palpation: No palpable anomalies       Provocative Tests: Hyperextension/rotation test: (+) bilaterally for facet joint pain. Lumbar quadrant test (Kemp's test): (+) bilateral for foraminal stenosis Lateral bending test: (+) ipsilateral radicular pain, bilaterally. Positive for bilateral foraminal stenosis.  Gait & Posture Assessment  Ambulation: Unassisted Gait: Relatively normal for age and body habitus Posture: WNL  Lower Extremity Exam    Side: Right lower extremity  Side: Left lower extremity  Stability: No instability observed          Stability: No instability observed          Skin & Extremity Inspection: Skin color, temperature, and hair growth are WNL. No peripheral edema or cyanosis. No masses, redness, swelling, asymmetry, or associated skin lesions. No contractures.  Skin & Extremity Inspection: Skin color, temperature, and hair growth are WNL. No peripheral edema or cyanosis. No masses, redness, swelling, asymmetry, or associated skin lesions. No contractures.  Functional ROM: Unrestricted ROM                  Functional ROM: Unrestricted ROM                  Muscle Tone/Strength: Functionally intact. No obvious neuro-muscular anomalies detected.  Muscle Tone/Strength: Functionally intact. No obvious neuro-muscular anomalies detected.  Sensory (Neurological): Unimpaired        Sensory (Neurological): Unimpaired        DTR: Patellar: deferred today Achilles: deferred today Plantar: deferred today  DTR: Patellar: deferred today Achilles: deferred today Plantar: deferred today  Palpation: No palpable anomalies  Palpation: No palpable anomalies    Assessment  Primary Diagnosis & Pertinent Problem List: The primary encounter diagnosis was Lumbar spondylosis. Diagnoses of Lumbar  facet arthropathy, Chronic radicular lumbar pain, Lumbar radiculopathy, Degeneration of intervertebral disc of lumbar region with discogenic back pain and lower extremity pain, Spinal stenosis, lumbar region, with neurogenic claudication, and Chronic pain syndrome were also pertinent to this visit.  Visit Diagnosis (New problems to examiner): 1. Lumbar spondylosis   2. Lumbar facet arthropathy   3. Chronic radicular lumbar pain   4. Lumbar radiculopathy   5. Degeneration of intervertebral disc of lumbar region with discogenic back pain and lower extremity pain   6. Spinal stenosis, lumbar region, with neurogenic claudication   7. Chronic pain syndrome    Plan of Care (Initial workup plan)  General Recommendations: The pain condition that the patient suffers from is best treated with a multidisciplinary approach that involves an increase in physical activity to prevent de-conditioning and worsening of the pain cycle, as well as psychological counseling (formal and/or informal) to address the co-morbid psychological affects of pain. Treatment will often involve  interventional procedures to decrease the pain, allowing the patient to participate in the physical activity that will ultimately produce long-lasting pain reductions. The goal of the multidisciplinary approach  is to return the patient to a higher level of overall function and to restore their ability to perform activities of daily living.   For her chronic lumbar radicular pain with radiation into her posterior lateral leg, we discussed a lumbar epidural steroid injection  For axial low back pain related to lumbar spondylosis, we discussed diagnostic lumbar facet medial branch nerve blocks.  History of polysubstance abuse in the past.  I will primarily be focusing on interventional based pain management therapies.  Procedure Orders         Lumbar Epidural Injection        Provider-requested follow-up: Return in about 4 weeks (around  02/12/2023) for L4/5 ESI , in clinic [facility].  Future Appointments  Date Time Provider Department Center  01/16/2023 11:00 AM [facility]    Duration of encounter: .  Total time on encounter, as per AMA guidelines included both the face-to-face and non-face-to-face time personally spent by the physician and/or other qualified health care professional(s) on the day of the encounter (includes time in activities that require the physician or other qualified health care professional and does not include time in activities normally performed by clinical staff). Physician's time may include the following activities when performed: Preparing to see the patient (e.g., pre-charting review of records, searching for previously ordered imaging, lab work, and nerve conduction tests) Review of prior analgesic pharmacotherapies. Reviewing PMP Interpreting ordered tests (e.g., lab work, imaging, nerve conduction tests) Performing post-procedure evaluations, including interpretation of diagnostic procedures Obtaining and/or reviewing separately obtained history Performing a medically appropriate examination and/or evaluation Counseling and educating the patient/family/caregiver Ordering medications, tests, or procedures Referring and communicating with other health care professionals (when not separately reported) Documenting clinical information in the electronic or other health record Independently interpreting results (not separately reported) and communicating results to the patient/ family/caregiver Care coordination (not separately reported)  Note by: Shirley Jolly, MD (TTS technology used. I apologize for any typographical errors that were not detected and corrected.) Date: 01/15/2023; Time: 1:03 PM

## 2023-01-15 NOTE — Patient Instructions (Signed)
______________________________________________________________________    Preparing for your procedure  Appointments: If you think you may not be able to keep your appointment, call 24-48 hours in advance to cancel. We need time to make it available to others.  During your procedure appointment there will be: No Prescription Refills. No disability issues to discussed. No medication changes or discussions.  Instructions: Food intake: Avoid eating anything solid for at least 8 hours prior to your procedure. Clear liquid intake: You may take clear liquids such as water up to 2 hours prior to your procedure. (No carbonated drinks. No soda.) Transportation: Unless otherwise stated by your physician, bring a driver. (Driver cannot be a Market researcher, Pharmacist, community, or any other form of public transportation.) Morning Medicines: Except for blood thinners, take all of your other morning medications with a sip of water. Make sure to take your heart and blood pressure medicines. If your blood pressure's lower number is above 100, the case will be rescheduled. Blood thinners: Make sure to stop your blood thinners as instructed.  If you take a blood thinner, but were not instructed to stop it, call our office [phone] and ask to talk to a nurse. Not stopping a blood thinner prior to certain procedures could lead to serious complications. Diabetics on insulin: Notify the staff so that you can be scheduled 1st case in the morning. If your diabetes requires high dose insulin, take only  of your normal insulin dose the morning of the procedure and notify the staff that you have done so. Preventing infections: Shower with an antibacterial soap the morning of your procedure.  Build-up your immune system: Take 1000 mg of Vitamin C with every meal (3 times a day) the day prior to your procedure. Antibiotics: Inform the nursing staff if you are taking any antibiotics or if you have any conditions that may require antibiotics  prior to procedures. (Example: recent joint implants)   Pregnancy: If you are pregnant make sure to notify the nursing staff. Not doing so may result in injury to the fetus, including death.  Sickness: If you have a cold, fever, or any active infections, call and cancel or reschedule your procedure. Receiving steroids while having an infection may result in complications. Arrival: You must be in the facility at least 30 minutes prior to your scheduled procedure. Tardiness: Your scheduled time is also the cutoff time. If you do not arrive at least 15 minutes prior to your procedure, you will be rescheduled.  Children: Do not bring any children with you. Make arrangements to keep them home. Dress appropriately: There is always a possibility that your clothing may get soiled. Avoid long dresses. Valuables: Do not bring any jewelry or valuables.  Reasons to call and reschedule or cancel your procedure: (Following these recommendations will minimize the risk of a serious complication.) Surgeries: Avoid having procedures within 2 weeks of any surgery. (Avoid for 2 weeks before or after any surgery). Flu Shots: Avoid having procedures within 2 weeks of a flu shots or . (Avoid for 2 weeks before or after immunizations). Barium: Avoid having a procedure within 7-10 days after having had a radiological study involving the use of radiological contrast. (Myelograms, Barium swallow or enema study). Heart attacks: Avoid any elective procedures or surgeries for the initial 6 months after a "Myocardial Infarction" (Heart Attack). Blood thinners: It is imperative that you stop these medications before procedures. Let us know if you if you take any blood thinner.  Infection: Avoid procedures during or within  two weeks of an infection (including chest colds or gastrointestinal problems). Symptoms associated with infections include: Localized redness, fever, chills, night sweats or profuse sweating, burning sensation  when voiding, cough, congestion, stuffiness, runny nose, sore throat, diarrhea, nausea, vomiting, cold or Flu symptoms, recent or current infections. It is specially important if the infection is over the area that we intend to treat. Heart and lung problems: Symptoms that may suggest an active cardiopulmonary problem include: cough, chest pain, breathing difficulties or shortness of breath, dizziness, ankle swelling, uncontrolled high or unusually low blood pressure, and/or palpitations. If you are experiencing any of these symptoms, cancel your procedure and contact your primary care physician for an evaluation.  Remember:  Regular Business hours are:  Monday to Thursday 8:00 AM to 4:00 PM  Provider's Schedule: Delano Metz, MD:  Procedure days: Tuesday and Thursday 7:30 AM to 4:00 PM  Edward Jolly, MD:  Procedure days: Monday and Wednesday 7:30 AM to 4:00 PM Last  Updated: 11/13/2022 ______________________________________________________________________    Epidural Steroid Injection  An epidural steroid injection is a shot of steroid medicine, also called cortisone, and a numbing medicine that is given into the epidural space. This space is between the spinal cord and the bones of the back. This shot helps relieve pain caused by an irritated or swollen nerve root. The pain relief you get from the injection depends on the cause of your condition and how long your pain lasts. You may have a period of slightly more pain after your injection, before the steroid medicine takes effect. This medicine usually starts working within 1-3 days. In some cases, you might need 7-10 days to feel the full effect. Tell your health care provider about: Any allergies you have. All medicines you are taking, including vitamins, herbs, eye drops, creams, and over-the-counter medicines. Any problems you or family members have had with anesthesia. Any bleeding problems you have. Any surgeries you have  had. Any medical conditions you have. Whether you are pregnant or may be pregnant. What are the risks? Your health care provider will talk with you about risks. These may include: Headache. Bleeding. Infection. Allergic reaction to medicines or dyes. Nerve damage. Not being able to move (paralysis). This is rare. What happens before the procedure? Medicines You may be given medicines to lower anxiety. Ask your provider about: Changing or stopping your regular medicines. These include any diabetes medicines or blood thinners you take. Taking medicines such as aspirin and ibuprofen. These medicines can thin your blood. Do not take them unless your provider tells you to. Taking over-the-counter medicines, vitamins, herbs, and supplements. General instructions Follow instructions from your provider about what you may eat and drink. Ask your provider what steps will be taken to help prevent infection. If you will be going home right after the procedure, plan to have a responsible adult: Take you home from the hospital or clinic. You will not be allowed to drive. Care for you for the time you are told. What happens during the procedure?  An IV will be inserted into one of your veins. You may be given a sedative to help you relax. You will be asked to sit or lie on your side. The injection site will be cleaned. An X-ray machine will be used to guide the needle close to the nerve that is causing pain. A needle will be put through your skin into the epidural space. This may cause you some discomfort. Contrast dye may be injected at the site  to make sure that the steroid medicine will be sent to the exact place it needs to go. The steroid medicine and a numbing medicine will be injected into the epidural space for pain relief. The needle will be removed. A bandage (dressing) will be put over the injection site. The procedure may vary among providers and hospitals. What happens after the  procedure? Your blood pressure, heart rate, breathing rate, and blood oxygen level will be monitored until you leave the hospital or clinic. Your IV will be removed. Your arm or leg may feel weak or numb for a few hours. This information is not intended to replace advice given to you by your health care provider. Make sure you discuss any questions you have with your health care provider. Document Revised: 10/20/2021 Document Reviewed: 10/20/2021 Elsevier Patient Education  2024 [employer].

## 2023-01-15 NOTE — Progress Notes (Signed)
Safety precautions to be maintained throughout the outpatient stay will include: orient to surroundings, keep bed in low position, maintain call bell within reach at all times, provide assistance with transfer out of bed and ambulation.  

## 2023-01-16 ENCOUNTER — Ambulatory Visit
Admission: RE | Admit: 2023-01-16 | Discharge: 2023-01-16 | Disposition: A | Discharge status: Home / Self Care | Payer: Medicare HMO | Source: Ambulatory Visit | Attending: Nurse Practitioner | Admitting: Nurse Practitioner

## 2023-01-16 DIAGNOSIS — K760 Fatty (change of) liver, not elsewhere classified: Secondary | ICD-10-CM | POA: Insufficient documentation

## 2023-01-16 DIAGNOSIS — R748 Abnormal levels of other serum enzymes: Secondary | ICD-10-CM | POA: Diagnosis present

## 2023-01-16 DIAGNOSIS — K7031 Alcoholic cirrhosis of liver with ascites: Secondary | ICD-10-CM | POA: Diagnosis present

## 2023-02-11 ENCOUNTER — Ambulatory Visit
Admission: RE | Admit: 2023-02-11 | Discharge: 2023-02-11 | Disposition: A | Discharge status: Home / Self Care | Payer: Medicare HMO | Source: Ambulatory Visit | Attending: Student in an Organized Health Care Education/Training Program | Admitting: Student in an Organized Health Care Education/Training Program

## 2023-02-11 ENCOUNTER — Encounter: Payer: Self-pay | Admitting: Student in an Organized Health Care Education/Training Program

## 2023-02-11 ENCOUNTER — Ambulatory Visit
Payer: Medicare HMO | Attending: Student in an Organized Health Care Education/Training Program | Admitting: Student in an Organized Health Care Education/Training Program

## 2023-02-11 DIAGNOSIS — G894 Chronic pain syndrome: Secondary | ICD-10-CM | POA: Diagnosis present

## 2023-02-11 DIAGNOSIS — G8929 Other chronic pain: Secondary | ICD-10-CM | POA: Insufficient documentation

## 2023-02-11 DIAGNOSIS — M48062 Spinal stenosis, lumbar region with neurogenic claudication: Secondary | ICD-10-CM | POA: Diagnosis present

## 2023-02-11 DIAGNOSIS — M5416 Radiculopathy, lumbar region: Secondary | ICD-10-CM | POA: Insufficient documentation

## 2023-02-11 MED ORDER — DEXAMETHASONE SODIUM PHOSPHATE 10 MG/ML IJ SOLN
10.0000 mg | Freq: Once | INTRAMUSCULAR | Status: AC
  Administered 2023-02-11: 10 mg
  Filled 2023-02-11: qty 1

## 2023-02-11 MED ORDER — ROPIVACAINE HCL 2 MG/ML IJ SOLN
2.0000 mL | Freq: Once | INTRAMUSCULAR | Status: AC
  Administered 2023-02-11: 2 mL via EPIDURAL
  Filled 2023-02-11: qty 20

## 2023-02-11 MED ORDER — IOHEXOL 180 MG/ML  SOLN
10.0000 mL | Freq: Once | INTRAMUSCULAR | Status: AC
  Administered 2023-02-11: 10 mL via EPIDURAL
  Filled 2023-02-11: qty 20

## 2023-02-11 MED ORDER — SODIUM CHLORIDE 0.9% FLUSH
2.0000 mL | Freq: Once | INTRAVENOUS | Status: AC
  Administered 2023-02-11: 2 mL

## 2023-02-11 MED ORDER — LIDOCAINE HCL 2 % IJ SOLN
20.0000 mL | Freq: Once | INTRAMUSCULAR | Status: AC
  Administered 2023-02-11: 100 mg
  Filled 2023-02-11: qty 40

## 2023-02-11 NOTE — Patient Instructions (Signed)

## 2023-02-11 NOTE — Progress Notes (Signed)
PROVIDER NOTE: Interpretation of information contained herein should be left to medically-trained personnel. Specific patient instructions are provided elsewhere under "Patient Instructions" section of medical record. This document was created in part using STT-dictation technology, any transcriptional errors that may result from this process are unintentional.  Patient: Shirley Evans Type: Established DOB: 1965-06-30 MRN: [id number] PCP: Olena Leatherwood, FNP  Service: Procedure DOS: 02/11/2023 Setting: Ambulatory Location: Ambulatory outpatient facility Delivery: Face-to-face Provider: Edward Jolly, MD Specialty: Interventional Pain Management Specialty designation: 09 Location: Outpatient facility Ref. Prov.: Edward Jolly, MD       Interventional Therapy   Type: Lumbar epidural steroid injection (LESI) (interlaminar) #1    Laterality: Midline   Level:  L4-5 Level.  Imaging: Fluoroscopic guidance         Anesthesia: Local anesthesia (1-2% Lidocaine) DOS: 02/11/2023  Performed by: Edward Jolly, MD  Purpose: Diagnostic/Therapeutic Indications: Lumbar radicular pain of intraspinal etiology of more than 4 weeks that has failed to respond to conservative therapy and is severe enough to impact quality of life or function. 1. Chronic radicular lumbar pain   2. Lumbar radiculopathy   3. Spinal stenosis, lumbar region, with neurogenic claudication   4. Chronic pain syndrome    NAS-11 Pain score:   Pre-procedure: 7 /10   Post-procedure: 7 /10      Position / Prep / Materials:  Position: Prone w/ head of the table raised (slight reverse trendelenburg) to facilitate breathing.  Prep solution: ChloraPrep (2% chlorhexidine gluconate and 70% isopropyl alcohol) Prep Area: Entire Posterior Lumbar Region from lower scapular tip down to mid buttocks area and from flank to flank. Materials:  Tray: Epidural tray Needle(s):  Type: Epidural needle (Tuohy) Gauge (G):  22 Length: Regular  (3.5-in) Qty: 1   H&P (Pre-op Assessment):  Shirley Evans is a 57 y.o. (year old), female patient, seen today for interventional treatment. 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 Shirley Evans has a current medication list which includes the following prescription(s): acetaminophen, albuterol, albuterol, cetirizine, cholecalciferol, cyclobenzaprine, ketoconazole, lidocaine, lisinopril, loperamide, magnesium gluconate, metoprolol succinate, naproxen, omeprazole, ondansetron, sertraline, tiotropium, valacyclovir, bupropion, and rosuvastatin. Her primarily concern today is the Back Pain (Mid to lower back)  Initial Vital Signs:  Pulse/HCG Rate: 75ECG Heart Rate: 70 Temp: (!) 97.5 F (36.4 C) Resp: 18 BP: (!) 146/89 SpO2: 100 %  BMI: Estimated body mass index is 22.31 kg/m as calculated from the following:   Height as of this encounter: 5\' 4"  (1.626 m).   Weight as of this encounter: 130 lb (59 kg).  Risk Assessment: Allergies: Reviewed. She is allergic to demerol [meperidine] and codeine.  Allergy Precautions: None required Coagulopathies: Reviewed. None identified.  Blood-thinner therapy: None at this time Active Infection(s): Reviewed. None identified. Shirley Evans is afebrile  Site Confirmation: Shirley Evans was asked to confirm the procedure and laterality before marking the site Procedure checklist: Completed Consent: Before the procedure and under the influence of no sedative(s), amnesic(s), or anxiolytics, the patient was informed of the treatment options, risks and possible complications. To fulfill our ethical and legal obligations, as recommended by the American Medical Association's Code of Ethics, I have informed the patient of my clinical impression; the nature and purpose of the treatment or procedure; the risks, benefits, and possible complications of the intervention; the alternatives, including doing  nothing; the risk(s) and benefit(s) of the alternative treatment(s) or procedure(s); and the risk(s) and benefit(s) of doing nothing.  The patient was provided information about the general risks and possible complications associated with the procedure. These may include, but are not limited to: failure to achieve desired goals, infection, bleeding, organ or nerve damage, allergic reactions, paralysis, and death. In addition, the patient was informed of those risks and complications associated to Spine-related procedures, such as failure to decrease pain; infection (i.e.: Meningitis, epidural or intraspinal abscess); bleeding (i.e.: epidural hematoma, subarachnoid hemorrhage, or any other type of intraspinal or peri-dural bleeding); organ or nerve damage (i.e.: Any type of peripheral nerve, nerve root, or spinal cord injury) with subsequent damage to sensory, motor, and/or autonomic systems, resulting in permanent pain, numbness, and/or weakness of one or several areas of the body; allergic reactions; (i.e.: anaphylactic reaction); and/or death. Furthermore, the patient was informed of those risks and complications associated with the medications. These include, but are not limited to: allergic reactions (i.e.: anaphylactic or anaphylactoid reaction(s)); adrenal axis suppression; blood sugar elevation that in diabetics may result in ketoacidosis or comma; water retention that in patients with history of congestive heart failure may result in shortness of breath, pulmonary edema, and decompensation with resultant heart failure; weight gain; swelling or edema; medication-induced neural toxicity; particulate matter embolism and blood vessel occlusion with resultant organ, and/or nervous system infarction; and/or aseptic necrosis of one or more joints. Finally, the patient was informed that Medicine is not an exact science; therefore, there is also the possibility of unforeseen or unpredictable risks and/or possible  complications that may result in a catastrophic outcome. The patient indicated having understood very clearly. We have given the patient no guarantees and we have made no promises. Enough time was given to the patient to ask questions, all of which were answered to the patient's satisfaction. Shirley Evans has indicated that she wanted to continue with the procedure. Attestation: I, the ordering provider, attest that I have discussed with the patient the benefits, risks, side-effects, alternatives, likelihood of achieving goals, and potential problems during recovery for the procedure that I have provided informed consent. Date  Time: 02/11/2023 10:50 AM   Pre-Procedure Preparation:  Monitoring: As per clinic protocol. Respiration, ETCO2, SpO2, BP, heart rate and rhythm monitor placed and checked for adequate function Safety Precautions: Patient was assessed for positional comfort and pressure points before starting the procedure. Time-out: I initiated and conducted the "Time-out" before starting the procedure, as per protocol. The patient was asked to participate by confirming the accuracy of the "Time Out" information. Verification of the correct person, site, and procedure were performed and confirmed by me, the nursing staff, and the patient. "Time-out" conducted as per Joint Commission's Universal Protocol (UP.01.01.01). Time: 1115 Start Time: 1115 hrs.  Description/Narrative of Procedure:          Target: Epidural space via interlaminar opening, initially targeting the lower laminar border of the superior vertebral body. Region: Lumbar Approach: Percutaneous paravertebral  Rationale (medical necessity): procedure needed and proper for the diagnosis and/or treatment of the patient's medical symptoms and needs. Procedural Technique Safety Precautions: Aspiration looking for blood return was conducted prior to all injections. At no point did we inject any substances, as a needle was being  advanced. No attempts were made at seeking any paresthesias. Safe injection practices and needle disposal techniques used. Medications properly checked for expiration dates. SDV (single dose vial) medications used. Description of the Procedure: Protocol guidelines were followed. The procedure needle was introduced through the skin, ipsilateral to the reported pain, and advanced to the target area. Bone  was contacted and the needle walked caudad, until the lamina was cleared. The epidural space was identified using "loss-of-resistance technique" with 2-3 ml of PF-NaCl (0.9% NSS), in a 5cc LOR glass syringe.  Vitals:   02/11/23 1058 02/11/23 1115 02/11/23 1119  BP: (!) 146/89 (!) 168/108 (!) 162/102  Pulse: 75    Resp:  18 18  Temp: (!) 97.5 F (36.4 C)    TempSrc: Temporal    SpO2: 100% 99% 100%  Weight: 130 lb (59 kg)    Height: 5\' 4"  (1.626 m)      Start Time: 1115 hrs. End Time: 1118 hrs.  Imaging Guidance (Spinal):          Type of Imaging Technique: Fluoroscopy Guidance (Spinal) Indication(s): Fluoroscopy guidance for needle placement to enhance accuracy in procedures requiring precise needle localization for targeted delivery of medication in or near specific anatomical locations not easily accessible without such real-time imaging assistance. Exposure Time: Please see nurses notes. Contrast: Before injecting any contrast, we confirmed that the patient did not have an allergy to iodine, shellfish, or radiological contrast. Once satisfactory needle placement was completed at the desired level, radiological contrast was injected. Contrast injected under live fluoroscopy. No contrast complications. See chart for type and volume of contrast used. Fluoroscopic Guidance: I was personally present during the use of fluoroscopy. "Tunnel Vision Technique" used to obtain the best possible view of the target area. Parallax error corrected before commencing the procedure. "Direction-depth-direction"  technique used to introduce the needle under continuous pulsed fluoroscopy. Once target was reached, antero-posterior, oblique, and lateral fluoroscopic projection used confirm needle placement in all planes. Images permanently stored in EMR. Interpretation: I personally interpreted the imaging intraoperatively. Adequate needle placement confirmed in multiple planes. Appropriate spread of contrast into desired area was observed. No evidence of afferent or efferent intravascular uptake. No intrathecal or subarachnoid spread observed. Permanent images saved into the patient's record.  Antibiotic Prophylaxis:   Anti-infectives (From admission, onward)    None      Indication(s): None identified  Post-operative Assessment:  Post-procedure Vital Signs:  Pulse/HCG Rate: 7569 Temp: (!) 97.5 F (36.4 C) Resp: 18 BP: (!) 162/102 SpO2: 100 %  EBL: None  Complications: No immediate post-treatment complications observed by team, or reported by patient.  Note: The patient tolerated the entire procedure well. A repeat set of vitals were taken after the procedure and the patient was kept under observation following institutional policy, for this type of procedure. Post-procedural neurological assessment was performed, showing return to baseline, prior to discharge. The patient was provided with post-procedure discharge instructions, including a section on how to identify potential problems. Should any problems arise concerning this procedure, the patient was given instructions to immediately contact us, at any time, without hesitation. In any case, we plan to contact the patient by telephone for a follow-up status report regarding this interventional procedure.  Comments:  No additional relevant information.  Plan of Care (POC)  Orders:  Orders Placed This Encounter  Procedures   DG PAIN CLINIC C-ARM 1-60 MIN NO REPORT    Intraoperative interpretation by procedural physician at [facility].    Standing Status:   Standing    Number of Occurrences:   1    Order Specific Question:   Reason for exam:    Answer:   Assistance in needle guidance and placement for procedures requiring needle placement in or near specific anatomical locations not easily accessible without such assistance.    Medications  ordered for procedure: Meds ordered this encounter  Medications   iohexol (OMNIPAQUE) 180 MG/ML injection 10 mL    Must be Myelogram-compatible. If not available, you may substitute with a water-soluble, non-ionic, hypoallergenic, myelogram-compatible radiological contrast medium.   lidocaine (XYLOCAINE) 2 % (with pres) injection 400 mg   ropivacaine (PF) 2 mg/mL (0.2%) (NAROPIN) injection 2 mL   sodium chloride flush (NS) 0.9 % injection 2 mL   dexamethasone (DECADRON) injection 10 mg   Medications administered: We administered iohexol, lidocaine, ropivacaine (PF) 2 mg/mL (0.2%), sodium chloride flush, and dexamethasone.  See the medical record for exact dosing, route, and time of administration.  Follow-up plan:   Return in about 17 days (around 02/28/2023) for PPE, F2F.       L4-L5 ESI 02/11/2023    Recent Visits Date Type Provider Dept  01/15/23 Office Visit Edward Jolly, MD [facility]  Showing recent visits within past 90 days and meeting all other requirements Today's Visits Date Type Provider Dept  02/11/23 Procedure visit Edward Jolly, MD [facility]  Showing today's visits and meeting all other requirements Future Appointments Date Type Provider Dept  03/04/23 Appointment Edward Jolly, MD [facility]  Showing future appointments within next 90 days and meeting all other requirements  Disposition: Discharge home  Discharge (Date  Time): 02/11/2023; 1125 hrs.   Primary Care Physician: Olena Leatherwood, FNP Location: [facility] Note by: Edward Jolly, MD (TTS technology used. I apologize  for any typographical errors that were not detected and corrected.) Date: 02/11/2023; Time: 12:00 PM  Disclaimer:  Medicine is not an Visual merchandiser. The only guarantee in medicine is that nothing is guaranteed. It is important to note that the decision to proceed with this intervention was based on the information collected from the patient. The Data and conclusions were drawn from the patient's questionnaire, the interview, and the physical examination. Because the information was provided in large part by the patient, it cannot be guaranteed that it has not been purposely or unconsciously manipulated. Every effort has been made to obtain as much relevant data as possible for this evaluation. It is important to note that the conclusions that lead to this procedure are derived in large part from the available data. Always take into account that the treatment will also be dependent on availability of resources and existing treatment guidelines, considered by other Pain Management Practitioners as being common knowledge and practice, at the time of the intervention. For Medico-Legal purposes, it is also important to point out that variation in procedural techniques and pharmacological choices are the acceptable norm. The indications, contraindications, technique, and results of the above procedure should only be interpreted and judged by a Board-Certified Interventional Pain Specialist with extensive familiarity and expertise in the same exact procedure and technique.

## 2023-02-11 NOTE — Progress Notes (Signed)
Safety precautions to be maintained throughout the outpatient stay will include: orient to surroundings, keep bed in low position, maintain call bell within reach at all times, provide assistance with transfer out of bed and ambulation.  

## 2023-02-15 ENCOUNTER — Encounter: Payer: Self-pay | Admitting: *Deleted

## 2023-03-04 ENCOUNTER — Ambulatory Visit: Payer: Medicare HMO | Admitting: Student in an Organized Health Care Education/Training Program

## 2023-03-11 ENCOUNTER — Other Ambulatory Visit: Payer: Self-pay

## 2023-03-11 DIAGNOSIS — Z87891 Personal history of nicotine dependence: Secondary | ICD-10-CM

## 2023-03-11 DIAGNOSIS — Z122 Encounter for screening for malignant neoplasm of respiratory organs: Secondary | ICD-10-CM

## 2023-03-18 ENCOUNTER — Ambulatory Visit: Payer: Medicare HMO

## 2023-03-26 ENCOUNTER — Encounter: Payer: Self-pay | Admitting: *Deleted

## 2023-04-04 ENCOUNTER — Telehealth: Payer: Self-pay

## 2023-04-04 NOTE — Telephone Encounter (Signed)
 Called patient to reschedule missed SDMV and CT. [phone] no VM [phone] VM Full. Letter sent to home. No mychart acces in over one year.

## 2023-05-03 ENCOUNTER — Other Ambulatory Visit: Payer: Self-pay | Admitting: Internal Medicine

## 2023-05-03 DIAGNOSIS — K7030 Alcoholic cirrhosis of liver without ascites: Secondary | ICD-10-CM

## 2023-05-12 ENCOUNTER — Emergency Department
Admission: EM | Admit: 2023-05-12 | Discharge: 2023-05-12 | Disposition: A | Discharge status: Home / Self Care | Payer: 59 | Attending: Emergency Medicine | Admitting: Emergency Medicine

## 2023-05-12 ENCOUNTER — Other Ambulatory Visit: Payer: Self-pay

## 2023-05-12 ENCOUNTER — Emergency Department: Payer: 59

## 2023-05-12 DIAGNOSIS — Y92000 Kitchen of unspecified non-institutional (private) residence as  the place of occurrence of the external cause: Secondary | ICD-10-CM | POA: Diagnosis not present

## 2023-05-12 DIAGNOSIS — M79601 Pain in right arm: Secondary | ICD-10-CM | POA: Diagnosis present

## 2023-05-12 DIAGNOSIS — W010XXA Fall on same level from slipping, tripping and stumbling without subsequent striking against object, initial encounter: Secondary | ICD-10-CM | POA: Insufficient documentation

## 2023-05-12 DIAGNOSIS — S42331A Displaced oblique fracture of shaft of humerus, right arm, initial encounter for closed fracture: Secondary | ICD-10-CM | POA: Insufficient documentation

## 2023-05-12 DIAGNOSIS — S42114A Nondisplaced fracture of body of scapula, right shoulder, initial encounter for closed fracture: Secondary | ICD-10-CM | POA: Diagnosis not present

## 2023-05-12 MED ORDER — ONDANSETRON 4 MG PO TBDP: 4.0000 mg | ORAL_TABLET | Freq: Three times a day (TID) | ORAL | 0 refills | Status: AC | PRN

## 2023-05-12 MED ORDER — OXYCODONE HCL 5 MG PO TABS
5.0000 mg | ORAL_TABLET | Freq: Once | ORAL | Status: AC
  Administered 2023-05-12: 5 mg via ORAL
  Filled 2023-05-12: qty 1

## 2023-05-12 MED ORDER — KETOROLAC TROMETHAMINE 30 MG/ML IJ SOLN
30.0000 mg | Freq: Once | INTRAMUSCULAR | Status: AC
  Administered 2023-05-12: 30 mg via INTRAVENOUS
  Filled 2023-05-12: qty 1

## 2023-05-12 MED ORDER — ONDANSETRON 4 MG PO TBDP
4.0000 mg | ORAL_TABLET | Freq: Once | ORAL | Status: AC
  Administered 2023-05-12: 4 mg via ORAL
  Filled 2023-05-12: qty 1

## 2023-05-12 MED ORDER — OXYCODONE-ACETAMINOPHEN 5-325 MG PO TABS
1.0000 | ORAL_TABLET | Freq: Three times a day (TID) | ORAL | 0 refills | Status: AC | PRN
Start: 2023-05-12 — End: 2023-05-19

## 2023-05-12 MED ORDER — FENTANYL CITRATE PF 50 MCG/ML IJ SOSY
50.0000 ug | PREFILLED_SYRINGE | Freq: Once | INTRAMUSCULAR | Status: AC
  Administered 2023-05-12: 50 ug via INTRAVENOUS
  Filled 2023-05-12: qty 1

## 2023-05-12 NOTE — Discharge Instructions (Addendum)
Take the prescription pain meds as directed.  Wear the sling and the splint as applied.  Apply ice packs over the splint to help reduce inflammation.  Follow-up with Dr. Joice Lofts for further fracture management.

## 2023-05-12 NOTE — ED Notes (Signed)
Per pt chart, meds due at 1615. Per pyxis meds pulled by PA Menshaw at 1638. PA notified, and medication administration confirmed for that time.

## 2023-05-12 NOTE — ED Provider Triage Note (Signed)
Emergency Medicine Provider Triage Evaluation Note  Shirley Evans , a 58 y.o. female  was evaluated in triage.  Pt complains of fall x 2 days ago onto right arm. Patient is unable to move arm. Reports decrease sensation of the right should to elbow crease  Review of Systems  Positive:  Negative:   Physical Exam  BP (!) 168/119 (BP Location: Left Arm)   Pulse (!) 110   Temp 98.1 F (36.7 C) (Oral)   Resp 17   SpO2 99%  Gen:   Awake, no distress   Resp:  Normal effort  MSK:   Moves extremities without difficulty  Other:  Deformity of right humerus noted.   Medical Decision Making  Medically screening exam initiated at 4:01 PM.  Appropriate orders placed.  Madelyn Brunner was informed that the remainder of the evaluation will be completed by another provider, this initial triage assessment does not replace that evaluation, and the importance of remaining in the ED until their evaluation is complete.    Romeo Apple, Rashanna Christiana A, PA-C 05/12/23 1607

## 2023-05-12 NOTE — ED Triage Notes (Addendum)
Pt sts that she fell on her right side on Friday. Pt sts that she has right sided body pain and it has not gotten any better. Pt ambulatory in triage however pt asked for wheelchair. Pt sts that she is dehydrated probably due to not drinking any water today. Pt also sts that she has not taken any of her BP meds either as she was told not to have any thing on her stomach since she is having surgery. However pt sts that she was able to take some aleve at home today for the pain and it has not helped.

## 2023-05-12 NOTE — ED Notes (Signed)
This tech and EDT Dee placed coaptation splint on pt. PA notified.

## 2023-05-12 NOTE — ED Provider Notes (Signed)
[facility] Emergency Department Provider Note     Event Date/Time   First MD Initiated Contact with Patient 05/12/23 2035     (approximate)   History   Fall   HPI  Shirley Evans is a 58 y.o. right-handed female presents present to the ED 2 days status post mechanical fall.  Patient describes she was at her brother's home, and tripped over the threshold from the living room to the kitchen.  She denies any head injury or LOC.  She would endorse acute right arm pain and disability just gotten worse in the last 2 days.  Physical Exam   Triage Vital Signs: ED Triage Vitals  Encounter Vitals Group     BP 05/12/23 1527 (!) 168/119     Systolic BP Percentile --      Diastolic BP Percentile --      Pulse Rate 05/12/23 1527 (!) 110     Resp 05/12/23 1527 17     Temp 05/12/23 1527 98.1 F (36.7 C)     Temp Source 05/12/23 1527 Oral     SpO2 05/12/23 1527 99 %     Weight --      Height --      Head Circumference --      Peak Flow --      Pain Score 05/12/23 2043 5     Pain Loc --      Pain Education --      Exclude from Growth Chart --     Most recent vital signs: Vitals:   05/12/23 2040 05/12/23 2200  BP: (!) 166/105 (!) 159/94  Pulse: 85 80  Resp: 16   Temp:    SpO2: 100% 95%    General Awake, no distress. NAD HEENT NCAT. PERRL. EOMI. No rhinorrhea. Mucous membranes are moist.  CV:  Good peripheral perfusion.  Normal cap refill distally. RESP:  Normal effort. CTA ABD:  No distention.  MSK:  RUE with obvious deformity at the mid humeral head.  There are some ecchymosis and bruising to the antecubital space.  Normal composite fist distally. NEURO: Cranial nerves II to XII grossly intact.   ED Results / Procedures / Treatments   Labs (all labs ordered are listed, but only abnormal results are displayed) Labs Reviewed - No data to display   EKG   RADIOLOGY  I personally viewed and evaluated these images as part of my medical  decision making, as well as reviewing the written report by the radiologist.  ED Provider Interpretation: Displaced angulated fracture of the proximal humeral shaft.  Inferior scapular angle fracture nondisplaced  DG Shoulder Right Result Date: 05/12/2023 CLINICAL DATA:  fall, pain EXAM: RIGHT SHOULDER - 2+ VIEW COMPARISON:  None Available. FINDINGS: There is a displaced fracture of the proximal humeral shaft with apex lateral angulation and lateral displacement of the proximal fragment. There is a mildly displaced fracture of the inferior angle of the scapula. Mild degenerative changes of the acromioclavicular joint. Humerus appears to be aligned with the glenoid. IMPRESSION: 1. Displaced and angulated fracture of the proximal humeral shaft. 2. Mildly displaced fracture of the inferior angle of the scapula. Electronically Signed   By: Meda Klinefelter M.D.   On: 05/12/2023 16:44     PROCEDURES:  Critical Care performed: No  Procedures Coaptation OCL splint and sling applied by ED tech (Ariel, NT)  MEDICATIONS ORDERED IN ED: Medications  ketorolac (TORADOL) 30 MG/ML injection 30 mg (has no administration in  time range)  oxyCODONE (Oxy IR/ROXICODONE) immediate release tablet 5 mg (has no administration in time range)  ondansetron (ZOFRAN-ODT) disintegrating tablet 4 mg (4 mg Oral Given by Other 05/12/23 1638)  oxyCODONE (Oxy IR/ROXICODONE) immediate release tablet 5 mg (5 mg Oral Given by Other 05/12/23 1638)  fentaNYL (SUBLIMAZE) injection 50 mcg (50 mcg Intravenous Given 05/12/23 2223)     IMPRESSION / MDM / ASSESSMENT AND PLAN / ED COURSE  I reviewed the triage vital signs and the nursing notes.                              Differential diagnosis includes, but is not limited to, shoulder fracture, shoulder dislocation, humerus fracture, tendinitis, rotator cuff strain  Patient's presentation is most consistent with acute complicated illness / injury requiring diagnostic  workup. ----------------------------------------- 9:41 PM on 05/12/2023 ----------------------------------------- S/W Dr. Joice Lofts via secure chat regarding the case.  He recommends coaptation splint and sling after reviewing images.  They will follow the patient in the office for outpatient management.  Patient's diagnosis is consistent with acute nondisplaced oblique fracture of the right humeral shaft as well as a nondisplaced inferior right scapula fracture.  Fracture morphology is confirmed by plain films, reviewed by me.  Patient is placed in a coaptation OCL splint and a sling for support.  Patient will be discharged home with prescriptions for Zofran and Percocet. Patient is to follow up with [facility] as discussed, as needed or otherwise directed. Patient is given ED precautions to return to the ED for any worsening or new symptoms.  FINAL CLINICAL IMPRESSION(S) / ED DIAGNOSES   Final diagnoses:  Closed displaced oblique fracture of shaft of right humerus, initial encounter  Closed nondisplaced fracture of body of right scapula, initial encounter     Rx / DC Orders   ED Discharge Orders          Ordered    ondansetron (ZOFRAN-ODT) 4 MG disintegrating tablet  Every 8 hours PRN        05/12/23 2248    oxyCODONE-acetaminophen (PERCOCET) 5-325 MG tablet  Every 8 hours PRN        05/12/23 2248             Note:  This document was prepared using Dragon voice recognition software and may include unintentional dictation errors.    Lissa Hoard, PA-C 05/12/23 2300    Trinna Post, MD 05/15/23 [phone]

## 2023-05-15 ENCOUNTER — Ambulatory Visit: Payer: 59

## 2023-06-06 ENCOUNTER — Ambulatory Visit: Payer: 59 | Admitting: Student in an Organized Health Care Education/Training Program

## 2023-07-19 ENCOUNTER — Encounter: Payer: Self-pay | Admitting: *Deleted

## 2023-07-19 ENCOUNTER — Other Ambulatory Visit: Payer: Self-pay | Admitting: Specialist

## 2023-07-19 DIAGNOSIS — Z87891 Personal history of nicotine dependence: Secondary | ICD-10-CM

## 2023-07-19 DIAGNOSIS — J449 Chronic obstructive pulmonary disease, unspecified: Secondary | ICD-10-CM

## 2023-07-30 ENCOUNTER — Encounter
Admission: RE | Disposition: A | Discharge status: Home / Self Care | Payer: Self-pay | Source: Home / Self Care | Attending: Gastroenterology

## 2023-07-30 ENCOUNTER — Other Ambulatory Visit: Payer: Self-pay

## 2023-07-30 ENCOUNTER — Ambulatory Visit
Admission: RE | Admit: 2023-07-30 | Discharge: 2023-07-30 | Disposition: A | Discharge status: Home / Self Care | Payer: 59 | Attending: Gastroenterology | Admitting: Gastroenterology

## 2023-07-30 ENCOUNTER — Ambulatory Visit: Admitting: Anesthesiology

## 2023-07-30 ENCOUNTER — Encounter: Payer: Self-pay | Admitting: *Deleted

## 2023-07-30 DIAGNOSIS — Z1211 Encounter for screening for malignant neoplasm of colon: Secondary | ICD-10-CM | POA: Insufficient documentation

## 2023-07-30 DIAGNOSIS — K7460 Unspecified cirrhosis of liver: Secondary | ICD-10-CM | POA: Diagnosis not present

## 2023-07-30 DIAGNOSIS — D122 Benign neoplasm of ascending colon: Secondary | ICD-10-CM | POA: Insufficient documentation

## 2023-07-30 DIAGNOSIS — K760 Fatty (change of) liver, not elsewhere classified: Secondary | ICD-10-CM | POA: Diagnosis not present

## 2023-07-30 DIAGNOSIS — K766 Portal hypertension: Secondary | ICD-10-CM | POA: Diagnosis not present

## 2023-07-30 DIAGNOSIS — J449 Chronic obstructive pulmonary disease, unspecified: Secondary | ICD-10-CM | POA: Diagnosis not present

## 2023-07-30 DIAGNOSIS — D123 Benign neoplasm of transverse colon: Secondary | ICD-10-CM | POA: Diagnosis not present

## 2023-07-30 DIAGNOSIS — Z800 Family history of malignant neoplasm of digestive organs: Secondary | ICD-10-CM | POA: Insufficient documentation

## 2023-07-30 DIAGNOSIS — K2100 Gastro-esophageal reflux disease with esophagitis, without bleeding: Secondary | ICD-10-CM | POA: Insufficient documentation

## 2023-07-30 DIAGNOSIS — K3189 Other diseases of stomach and duodenum: Secondary | ICD-10-CM | POA: Insufficient documentation

## 2023-07-30 DIAGNOSIS — D124 Benign neoplasm of descending colon: Secondary | ICD-10-CM | POA: Insufficient documentation

## 2023-07-30 DIAGNOSIS — I10 Essential (primary) hypertension: Secondary | ICD-10-CM | POA: Diagnosis not present

## 2023-07-30 HISTORY — DX: Occlusion and stenosis of bilateral carotid arteries: I65.23

## 2023-07-30 HISTORY — PX: POLYPECTOMY: SHX149

## 2023-07-30 HISTORY — DX: Other specified chronic obstructive pulmonary disease: J44.89

## 2023-07-30 HISTORY — DX: Acute kidney failure, unspecified: N17.9

## 2023-07-30 HISTORY — PX: HEMOSTASIS CLIP PLACEMENT: SHX6857

## 2023-07-30 HISTORY — DX: Benign neoplasm of meninges, unspecified: D32.9

## 2023-07-30 HISTORY — DX: Spinal stenosis, lumbar region without neurogenic claudication: M48.061

## 2023-07-30 HISTORY — DX: Calculus of gallbladder with chronic cholecystitis without obstruction: K80.10

## 2023-07-30 HISTORY — DX: Fracture of unspecified part of neck of left femur, initial encounter for closed fracture: S72.002A

## 2023-07-30 HISTORY — DX: Other specified abnormal findings of blood chemistry: R79.89

## 2023-07-30 HISTORY — DX: Diffuse cystic mastopathy of unspecified breast: N60.19

## 2023-07-30 HISTORY — DX: Dyskinesia of esophagus: K22.4

## 2023-07-30 HISTORY — DX: Depression, unspecified: F32.A

## 2023-07-30 HISTORY — DX: Carcinoma in situ of cervix, unspecified: D06.9

## 2023-07-30 HISTORY — DX: Fatty (change of) liver, not elsewhere classified: K76.0

## 2023-07-30 HISTORY — PX: ESOPHAGOGASTRODUODENOSCOPY (EGD) WITH PROPOFOL: SHX5813

## 2023-07-30 HISTORY — DX: Headache, unspecified: R51.9

## 2023-07-30 HISTORY — PX: COLONOSCOPY WITH PROPOFOL: SHX5780

## 2023-07-30 HISTORY — DX: Atherosclerotic heart disease of native coronary artery without angina pectoris: I25.10

## 2023-07-30 HISTORY — DX: Hyperlipidemia, unspecified: E78.5

## 2023-07-30 HISTORY — DX: Age-related osteoporosis with current pathological fracture, vertebra(e), initial encounter for fracture: M80.08XA

## 2023-07-30 SURGERY — COLONOSCOPY WITH PROPOFOL
Anesthesia: General

## 2023-07-30 MED ORDER — PHENYLEPHRINE 80 MCG/ML (10ML) SYRINGE FOR IV PUSH (FOR BLOOD PRESSURE SUPPORT)
PREFILLED_SYRINGE | INTRAVENOUS | Status: AC
  Filled 2023-07-30: qty 10

## 2023-07-30 MED ORDER — GLYCOPYRROLATE 0.2 MG/ML IJ SOLN
INTRAMUSCULAR | Status: AC
  Filled 2023-07-30: qty 1

## 2023-07-30 MED ORDER — PROPOFOL 1000 MG/100ML IV EMUL
INTRAVENOUS | Status: AC
  Filled 2023-07-30: qty 100

## 2023-07-30 MED ORDER — PROPOFOL 500 MG/50ML IV EMUL
INTRAVENOUS | Status: DC | PRN
  Administered 2023-07-30: 75 ug/kg/min via INTRAVENOUS

## 2023-07-30 MED ORDER — SODIUM CHLORIDE 0.9 % IV SOLN: INTRAVENOUS | Status: DC

## 2023-07-30 MED ORDER — LIDOCAINE HCL (PF) 2 % IJ SOLN
INTRAMUSCULAR | Status: AC
  Filled 2023-07-30: qty 5

## 2023-07-30 MED ORDER — LIDOCAINE HCL (CARDIAC) PF 100 MG/5ML IV SOSY
PREFILLED_SYRINGE | INTRAVENOUS | Status: DC | PRN
  Administered 2023-07-30: 50 mg via INTRAVENOUS

## 2023-07-30 MED ORDER — PHENYLEPHRINE 80 MCG/ML (10ML) SYRINGE FOR IV PUSH (FOR BLOOD PRESSURE SUPPORT)
PREFILLED_SYRINGE | INTRAVENOUS | Status: DC | PRN
  Administered 2023-07-30: 160 ug via INTRAVENOUS
  Administered 2023-07-30: 240 ug via INTRAVENOUS
  Administered 2023-07-30: 80 ug via INTRAVENOUS
  Administered 2023-07-30 (×2): 160 ug via INTRAVENOUS
  Administered 2023-07-30 (×2): 240 ug via INTRAVENOUS

## 2023-07-30 MED ORDER — PROPOFOL 10 MG/ML IV BOLUS
INTRAVENOUS | Status: DC | PRN
  Administered 2023-07-30 (×2): 50 mg via INTRAVENOUS

## 2023-07-30 MED ORDER — EPHEDRINE 5 MG/ML INJ
INTRAVENOUS | Status: AC
  Filled 2023-07-30: qty 5

## 2023-07-30 MED ORDER — DEXMEDETOMIDINE HCL IN NACL 80 MCG/20ML IV SOLN
INTRAVENOUS | Status: DC | PRN
  Administered 2023-07-30: 20 ug via INTRAVENOUS

## 2023-07-30 MED ORDER — GLYCOPYRROLATE 0.2 MG/ML IJ SOLN
INTRAMUSCULAR | Status: DC | PRN
  Administered 2023-07-30: .2 mg via INTRAVENOUS

## 2023-07-30 NOTE — Op Note (Signed)
 [facility] Patient Name: Shirley Evans Procedure Date: 07/30/2023 8:04 AM MRN: [id number] Account #: [id number] Date of Birth: [birth year] Admit Type: Outpatient Age: 58 Room: [facility] ENDO ROOM 1 Gender: Female Note Status: Finalized Instrument Name: Upper Endoscope [id number] Procedure:             Upper GI endoscopy Indications:           Cirrhosis rule out esophageal varices Providers:             Leida Puna MD, MD Medicines:             Monitored Anesthesia Care Complications:         No immediate complications. Procedure:             Pre-Anesthesia Assessment:                        - Prior to the procedure, a History and Physical was                         performed, and patient medications and allergies were                         reviewed. The patient is competent. The risks and                         benefits of the procedure and the sedation options and                         risks were discussed with the patient. All questions                         were answered and informed consent was obtained.                         Patient identification and proposed procedure were                         verified by the physician, the nurse, the                         anesthesiologist, the anesthetist and the technician                         in the endoscopy suite. Mental Status Examination:                         alert and oriented. Airway Examination: normal                         oropharyngeal airway and neck mobility. Respiratory                         Examination: clear to auscultation. CV Examination:                         normal. Prophylactic Antibiotics: The patient does not                         require prophylactic antibiotics. Prior  Anticoagulants: The patient has taken no anticoagulant                         or antiplatelet agents. ASA Grade Assessment: III - A                         patient  with severe systemic disease. After reviewing                         the risks and benefits, the patient was deemed in                         satisfactory condition to undergo the procedure. The                         anesthesia plan was to use monitored anesthesia care                         (MAC). Immediately prior to administration of                         medications, the patient was re-assessed for adequacy                         to receive sedatives. The heart rate, respiratory                         rate, oxygen saturations, blood pressure, adequacy of                         pulmonary ventilation, and response to care were                         monitored throughout the procedure. The physical                         status of the patient was re-assessed after the                         procedure.                        After obtaining informed consent, the endoscope was                         passed under direct vision. Throughout the procedure,                         the patient's blood pressure, pulse, and oxygen                         saturations were monitored continuously. The                         Endosonoscope was introduced through the mouth, and                         advanced to the second part of duodenum. The upper GI  endoscopy was accomplished without difficulty. The                         patient tolerated the procedure well. Findings:      LA Grade C (one or more mucosal breaks continuous between tops of 2 or       more mucosal folds, less than 75% circumference) esophagitis with no       bleeding was found. No varices were seen.      Moderate portal hypertensive gastropathy was found in the gastric body       and in the gastric antrum.      The examined duodenum was normal. Impression:            - LA Grade C esophagitis with no bleeding.                        - Portal hypertensive gastropathy.                        -  Normal examined duodenum.                        - No specimens collected. Recommendation:        - Discharge patient to home.                        - Resume previous diet.                        - Continue present medications.                        - Return to referring physician as previously                         scheduled.                        - Need to ensure compliance with PPI. Consider repeat                         to ensure healing. For variceal screening, can repeat                         in 3 years or unless decompensates. Procedure Code(s):     --- Professional ---                        [phone], Esophagogastroduodenoscopy, flexible,                         transoral; diagnostic, including collection of                         specimen(s) by brushing or washing, when performed                         (separate procedure) Diagnosis Code(s):     --- Professional ---                        K20.90, Esophagitis, unspecified without bleeding  K76.6, Portal hypertension                        K31.89, Other diseases of stomach and duodenum                        K74.60, Unspecified cirrhosis of liver CPT copyright 2022 [employer]. All rights reserved. The codes documented in this report are preliminary and upon coder review may  be revised to meet current compliance requirements. Leida Puna MD, MD 07/30/2023 8:55:04 AM Number of Addenda: 0 Note Initiated On: 07/30/2023 8:04 AM Estimated Blood Loss:  Estimated blood loss: none.      [facility]

## 2023-07-30 NOTE — H&P (Signed)
 Outpatient short stay form Pre-procedure 07/30/2023  Shane Darling, MD  Primary Physician: Mai Schwalbe, FNP  Reason for visit:  Variceal screening/Colon cancer screening  History of present illness:    58 y/o lady with alcoholic cirrhosis, hypertension, hx of drug abuse, and COPD here for EGD/Colonoscopy for esophageal variceal assessment and colon cancer screening due to father having colon cancer in his 24's. No blood thinners. History of cholecystectomy. Normal platelets and INR 1.3    Current Facility-Administered Medications:    0.9 %  sodium chloride  infusion, , Intravenous, Continuous, Carmilla Granville, Leanora Prophet, MD, Last Rate: 20 mL/hr at 07/30/23 0802, Continued from Pre-op at 07/30/23 0802  Medications Prior to Admission  Medication Sig Dispense Refill Last Dose/Taking   cholecalciferol  (VITAMIN D3) 25 MCG (1000 UNIT) tablet Take 1,000 Units by mouth daily.   Past Week   cloNIDine  (CATAPRES ) 0.1 MG tablet Take 0.1 mg by mouth 2 (two) times daily.   Taking   lisinopril  (ZESTRIL ) 20 MG tablet Take 1 tablet (20 mg total) by mouth daily. 30 tablet 0 07/29/2023   magnesium  gluconate (MAGONATE) 500 MG tablet Take 1 tablet (500 mg total) by mouth 2 (two) times daily. 60 tablet 1 Past Week   metoprolol succinate (TOPROL-XL) 25 MG 24 hr tablet Take 1 tablet by mouth daily.   07/29/2023   naproxen (EC NAPROSYN) 500 MG EC tablet Take 500 mg by mouth 2 (two) times daily with a meal.   Past Week   omeprazole  (PRILOSEC) 40 MG capsule Take 40 mg by mouth 2 (two) times daily.   07/29/2023   acetaminophen  (TYLENOL ) 650 MG CR tablet Take 650 mg by mouth every 8 (eight) hours as needed for pain.      albuterol  (PROVENTIL ) (2.5 MG/3ML) 0.083% nebulizer solution Take 3 mLs (2.5 mg total) by nebulization every 6 (six) hours as needed for wheezing or shortness of breath. 75 mL 1 07/28/2023   albuterol  (VENTOLIN  HFA) 108 (90 Base) MCG/ACT inhaler Inhale into the lungs.      buPROPion  (WELLBUTRIN ) 75 MG  tablet Take 75 mg by mouth 3 (three) times daily. (Patient not taking: Reported on 02/11/2023)      cetirizine (ZYRTEC) 10 MG tablet Take 10 mg by mouth daily.      cyclobenzaprine  (FLEXERIL ) 10 MG tablet Take 10 mg by mouth 2 (two) times daily.      dicyclomine  (BENTYL ) 10 MG capsule Take 10 mg by mouth 4 (four) times daily -  before meals and at bedtime. (Patient not taking: Reported on 07/30/2023)   Not Taking   ketoconazole (NIZORAL) 2 % cream Apply 1 Application topically daily.      lidocaine  (LIDODERM ) 5 % Place onto the skin.      loperamide  (IMODIUM ) 2 MG capsule Take 1 capsule (2 mg total) by mouth as needed for diarrhea or loose stools. 30 capsule 0    ondansetron  (ZOFRAN -ODT) 4 MG disintegrating tablet Take 1 tablet (4 mg total) by mouth every 8 (eight) hours as needed for nausea or vomiting. 15 tablet 0    rosuvastatin  (CRESTOR ) 40 MG tablet Take 40 mg by mouth daily. (Patient not taking: Reported on 02/11/2023)      sertraline  (ZOLOFT ) 100 MG tablet Take 100 mg by mouth daily. (Patient not taking: Reported on 07/30/2023)   Not Taking   tiotropium (SPIRIVA) 18 MCG inhalation capsule Place 1 capsule into inhaler and inhale daily. (Patient not taking: Reported on 07/30/2023)   Not Taking   valACYclovir  (VALTREX )  1000 MG tablet Take 1,000 mg by mouth daily. (Patient not taking: Reported on 07/30/2023)   Completed Course     Allergies  Allergen Reactions   Demerol [Meperidine] Nausea Only and Nausea And Vomiting   Codeine Nausea Only     Past Medical History:  Diagnosis Date   AKI (acute kidney injury) (HCC)    Bilateral carotid artery stenosis    Cancer (HCC)    cervical   CCC (chronic calculous cholecystitis)    Cervical intraepithelial neoplasia grade III with severe dysplasia    Closed fracture of left hip (HCC)    COPD (chronic obstructive pulmonary disease) (HCC)    COPD with chronic bronchitis (HCC)    Coronary artery disease    Depression    Dyslipidemia    Elevated LFTs     Fatty liver    Fibrocystic breast disease    GERD (gastroesophageal reflux disease)    Headache    History of Helicobacter pylori infection    Hypertension    Jackhammer esophagus    Meningioma (HCC)    Menopausal state    Osteopenia    Postmenopausal atrophic vaginitis    Spinal stenosis of lumbar region without neurogenic claudication    Vertebral fracture, osteoporotic (HCC)    Vertebral fracture, osteoporotic (HCC)     Review of systems:  Otherwise negative.    Physical Exam  Gen: Alert, oriented. Appears stated age.  HEENT: PERRLA. Lungs: No respiratory distress CV: RRR Abd: soft, benign, no masses Ext: No edema    Planned procedures: Proceed with EGD/colonoscopy. The patient understands the nature of the planned procedure, indications, risks, alternatives and potential complications including but not limited to bleeding, infection, perforation, damage to internal organs and possible oversedation/side effects from anesthesia. The patient agrees and gives consent to proceed.  Please refer to procedure notes for findings, recommendations and patient disposition/instructions.     Shane Darling, MD [facility]

## 2023-07-30 NOTE — Transfer of Care (Signed)
 Immediate Anesthesia Transfer of Care Note  Patient: Shirley Evans  Procedure(s) Performed: COLONOSCOPY WITH PROPOFOL  ESOPHAGOGASTRODUODENOSCOPY (EGD) WITH PROPOFOL  POLYPECTOMY, INTESTINE CONTROL OF HEMORRHAGE, GI TRACT, ENDOSCOPIC, BY CLIPPING OR OVERSEWING  Patient Location: PACU  Anesthesia Type:General  Level of Consciousness: sedated  Airway & Oxygen Therapy: Patient Spontanous Breathing  Post-op Assessment: Report given to RN and Post -op Vital signs reviewed and stable  Post vital signs: Reviewed and stable  Last Vitals:  Vitals Value Taken Time  BP 90/66 07/30/23 0852  Temp    Pulse 70 07/30/23 0853  Resp 16 07/30/23 0853  SpO2 97 % 07/30/23 0853  Vitals shown include unfiled device data.  Last Pain:  Vitals:   07/30/23 0722  TempSrc: Temporal  PainSc: 0-No pain         Complications: No notable events documented.

## 2023-07-30 NOTE — Interval H&P Note (Signed)
 History and Physical Interval Note:  07/30/2023 8:11 AM  Shirley Evans  has presented today for surgery, with the diagnosis of [place] GERD FH Colon CANCER.  The various methods of treatment have been discussed with the patient and family. After consideration of risks, benefits and other options for treatment, the patient has consented to  Procedure(s): COLONOSCOPY WITH PROPOFOL  (N/A) ESOPHAGOGASTRODUODENOSCOPY (EGD) WITH PROPOFOL  (N/A) as a surgical intervention.  The patient's history has been reviewed, patient examined, no change in status, stable for surgery.  I have reviewed the patient's chart and labs.  Questions were answered to the patient's satisfaction.     Shane Darling  Ok to proceed with EGD/Colonoscopy

## 2023-07-30 NOTE — Op Note (Signed)
 [facility] Patient Name: Shirley Evans Procedure Date: 07/30/2023 8:04 AM MRN: [id number] Account #: [id number] Date of Birth: Aug 01, 1965 Admit Type: Outpatient Age: 58 Room: [facility] ENDO ROOM 1 Gender: Female Note Status: Finalized Instrument Name: Peds Colonoscope [id number] Procedure:             Colonoscopy Indications:           Screening patient at increased risk: Family history of                         1st-degree relative with colorectal cancer at age 66                         years (or older) Providers:             Leida Puna MD, MD Medicines:             Monitored Anesthesia Care Complications:         No immediate complications. Estimated blood loss:                         Minimal. Procedure:             Pre-Anesthesia Assessment:                        - Prior to the procedure, a History and Physical was                         performed, and patient medications and allergies were                         reviewed. The patient is competent. The risks and                         benefits of the procedure and the sedation options and                         risks were discussed with the patient. All questions                         were answered and informed consent was obtained.                         Patient identification and proposed procedure were                         verified by the physician, the nurse, the                         anesthesiologist, the anesthetist and the technician                         in the endoscopy suite. Mental Status Examination:                         alert and oriented. Airway Examination: normal                         oropharyngeal airway and neck mobility. Respiratory  Examination: clear to auscultation. CV Examination:                         normal. Prophylactic Antibiotics: The patient does not                         require prophylactic antibiotics. Prior                          Anticoagulants: The patient has taken no anticoagulant                         or antiplatelet agents. ASA Grade Assessment: III - A                         patient with severe systemic disease. After reviewing                         the risks and benefits, the patient was deemed in                         satisfactory condition to undergo the procedure. The                         anesthesia plan was to use monitored anesthesia care                         (MAC). Immediately prior to administration of                         medications, the patient was re-assessed for adequacy                         to receive sedatives. The heart rate, respiratory                         rate, oxygen saturations, blood pressure, adequacy of                         pulmonary ventilation, and response to care were                         monitored throughout the procedure. The physical                         status of the patient was re-assessed after the                         procedure.                        After obtaining informed consent, the colonoscope was                         passed under direct vision. Throughout the procedure,                         the patient's blood pressure, pulse, and oxygen  saturations were monitored continuously. The                         Colonoscope was introduced through the anus and                         advanced to the the terminal ileum, with                         identification of the appendiceal orifice and IC                         valve. The colonoscopy was performed without                         difficulty. The patient tolerated the procedure well.                         The quality of the bowel preparation was good. The                         terminal ileum, ileocecal valve, appendiceal orifice,                         and rectum were photographed. Findings:      The perianal and digital rectal  examinations were normal.      The terminal ileum appeared normal.      A 2 mm polyp was found in the ascending colon. The polyp was sessile.       The polyp was removed with a jumbo cold forceps. Resection and retrieval       were complete. Estimated blood loss was minimal.      A 7 mm polyp was found in the ascending colon. The polyp was sessile.       The polyp was removed with a cold snare. Resection and retrieval were       complete. Estimated blood loss was minimal.      An 8 mm polyp was found in the distal transverse colon. The polyp was       sessile. The polyp was removed with a cold snare. Resection and       retrieval were complete. For hemostasis, one hemostatic clip was       successfully placed. There was no bleeding at the end of the procedure.      A 3 mm polyp was found in the descending colon. The polyp was sessile.       The polyp was removed with a cold snare. Resection and retrieval were       complete. Estimated blood loss was minimal.      The exam was otherwise without abnormality on direct and retroflexion       views. Impression:            - The examined portion of the ileum was normal.                        - One 2 mm polyp in the ascending colon, removed with                         a jumbo cold forceps. Resected and retrieved.                        -  One 7 mm polyp in the ascending colon, removed with                         a cold snare. Resected and retrieved.                        - One 8 mm polyp in the distal transverse colon,                         removed with a cold snare. Resected and retrieved.                         Clip was placed.                        - One 3 mm polyp in the descending colon, removed with                         a cold snare. Resected and retrieved.                        - The examination was otherwise normal on direct and                         retroflexion views. Recommendation:        - Discharge patient to home.                         - Resume previous diet.                        - Continue present medications.                        - Await pathology results.                        - Repeat colonoscopy in 3 years for surveillance.                        - Return to referring physician as previously                         scheduled. Procedure Code(s):     --- Professional ---                        [phone], Colonoscopy, flexible; with removal of                         tumor(s), polyp(s), or other lesion(s) by snare                         technique                        [id number], 58, Colonoscopy, flexible; with biopsy, single                         or multiple Diagnosis Code(s):     --- Professional ---  Z80.0, Family history of malignant neoplasm of                         digestive organs                        D12.2, Benign neoplasm of ascending colon                        D12.3, Benign neoplasm of transverse colon (hepatic                         flexure or splenic flexure)                        D12.4, Benign neoplasm of descending colon CPT copyright 2022 American Medical Association. All rights reserved. The codes documented in this report are preliminary and upon coder review may  be revised to meet current compliance requirements. Leida Puna MD, MD 07/30/2023 8:59:55 AM Number of Addenda: 0 Note Initiated On: 07/30/2023 8:04 AM Scope Withdrawal Time: 0 hours 17 minutes 37 seconds  Total Procedure Duration: 0 hours 22 minutes 5 seconds  Estimated Blood Loss:  Estimated blood loss was minimal.      [facility]

## 2023-07-30 NOTE — Anesthesia Postprocedure Evaluation (Signed)
 Anesthesia Post Note  Patient: Shirley Evans  Procedure(s) Performed: COLONOSCOPY WITH PROPOFOL  ESOPHAGOGASTRODUODENOSCOPY (EGD) WITH PROPOFOL  POLYPECTOMY, INTESTINE CONTROL OF HEMORRHAGE, GI TRACT, ENDOSCOPIC, BY CLIPPING OR OVERSEWING  Patient location during evaluation: Endoscopy Anesthesia Type: General Level of consciousness: awake and alert Pain management: pain level controlled Vital Signs Assessment: post-procedure vital signs reviewed and stable Respiratory status: spontaneous breathing, nonlabored ventilation, respiratory function stable and patient connected to nasal cannula oxygen Cardiovascular status: blood pressure returned to baseline and stable Postop Assessment: no apparent nausea or vomiting Anesthetic complications: no   No notable events documented.   Last Vitals:  Vitals:   07/30/23 0722 07/30/23 0850  BP: (!) 141/97 90/66  Pulse: 88   Resp: 20   Temp: 36.6 C 36.4 C  SpO2: 99%     Last Pain:  Vitals:   07/30/23 0910  TempSrc:   PainSc: 0-No pain                 Portia Brittle Claude Swendsen

## 2023-07-30 NOTE — Anesthesia Preprocedure Evaluation (Signed)
 Anesthesia Evaluation  Patient identified by MRN, date of birth, ID band Patient awake    Reviewed: Allergy & Precautions, NPO status , Patient's Chart, lab work & pertinent test results  History of Anesthesia Complications Negative for: history of anesthetic complications  Airway Mallampati: III  TM Distance: <3 FB Neck ROM: full    Dental  (+) Chipped, Poor Dentition, Missing   Pulmonary neg shortness of breath, COPD, former smoker   Pulmonary exam normal        Cardiovascular Exercise Tolerance: Good hypertension, (-) angina + CAD  Normal cardiovascular exam     Neuro/Psych  Headaches  Neuromuscular disease  negative psych ROS   GI/Hepatic Neg liver ROS,GERD  Controlled,,  Endo/Other  negative endocrine ROS    Renal/GU Renal disease  negative genitourinary   Musculoskeletal   Abdominal   Peds  Hematology negative hematology ROS (+)   Anesthesia Other Findings Past Medical History: No date: AKI (acute kidney injury) (HCC) No date: Bilateral carotid artery stenosis No date: Cancer [facility])     Comment:  cervical No date: CCC (chronic calculous cholecystitis) No date: Cervical intraepithelial neoplasia grade III with severe  dysplasia No date: Closed fracture of left hip (HCC) No date: COPD (chronic obstructive pulmonary disease) (HCC) No date: COPD with chronic bronchitis (HCC) No date: Coronary artery disease No date: Depression No date: Dyslipidemia No date: Elevated LFTs No date: Fatty liver No date: Fibrocystic breast disease No date: GERD (gastroesophageal reflux disease) No date: Headache No date: History of Helicobacter pylori infection No date: Hypertension No date: Jackhammer esophagus No date: Meningioma (HCC) No date: Menopausal state No date: Osteopenia No date: Postmenopausal atrophic vaginitis No date: Spinal stenosis of lumbar region without neurogenic  claudication No date: Vertebral  fracture, osteoporotic (HCC) No date: Vertebral fracture, osteoporotic (HCC)  Past Surgical History: No date: BACK SURGERY No date: BREAST BIOPSY; Bilateral     Comment:  neg No date: CHOLECYSTECTOMY No date: COLONOSCOPY No date: FRACTURE SURGERY No date: HAND SURGERY No date: JOINT REPLACEMENT No date: SPINE SURGERY No date: TONSILLECTOMY 12/22/2020: TOTAL HIP ARTHROPLASTY; Left     Comment:  Procedure: TOTAL HIP ARTHROPLASTY ANTERIOR APPROACH;                Surgeon: Molli Angelucci, MD;  Location: [facility] ORS;                Service: Orthopedics;  Laterality: Left;     Reproductive/Obstetrics negative OB ROS                             Anesthesia Physical Anesthesia Plan  ASA: 3  Anesthesia Plan: General   Post-op Pain Management:    Induction: Intravenous  PONV Risk Score and Plan: Propofol  infusion and TIVA  Airway Management Planned: Natural Airway and Nasal Cannula  Additional Equipment:   Intra-op Plan:   Post-operative Plan:   Informed Consent: I have reviewed the patients History and Physical, chart, labs and discussed the procedure including the risks, benefits and alternatives for the proposed anesthesia with the patient or authorized representative who has indicated his/her understanding and acceptance.     Dental Advisory Given  Plan Discussed with: Anesthesiologist, CRNA and Surgeon  Anesthesia Plan Comments: (Patient consented for risks of anesthesia including but not limited to:  - adverse reactions to medications - risk of airway placement if required - damage to eyes, teeth, lips or other oral mucosa - nerve damage  due to positioning  - sore throat or hoarseness - Damage to heart, brain, nerves, lungs, other parts of body or loss of life  Patient voiced understanding and assent.)       Anesthesia Quick Evaluation

## 2023-07-31 LAB — SURGICAL PATHOLOGY

## 2023-08-06 ENCOUNTER — Ambulatory Visit: Admitting: Student in an Organized Health Care Education/Training Program

## 2023-08-07 ENCOUNTER — Ambulatory Visit
Admission: RE | Admit: 2023-08-07 | Discharge: 2023-08-07 | Disposition: A | Discharge status: Home / Self Care | Source: Ambulatory Visit | Attending: Specialist | Admitting: Specialist

## 2023-08-07 DIAGNOSIS — J449 Chronic obstructive pulmonary disease, unspecified: Secondary | ICD-10-CM | POA: Diagnosis present

## 2023-08-07 DIAGNOSIS — Z87891 Personal history of nicotine dependence: Secondary | ICD-10-CM | POA: Diagnosis present

## 2023-08-15 ENCOUNTER — Ambulatory Visit: Admitting: Student in an Organized Health Care Education/Training Program

## 2023-08-20 ENCOUNTER — Ambulatory Visit
Attending: Student in an Organized Health Care Education/Training Program | Admitting: Student in an Organized Health Care Education/Training Program

## 2023-08-20 ENCOUNTER — Encounter: Payer: Self-pay | Admitting: Student in an Organized Health Care Education/Training Program

## 2023-08-20 VITALS — BP 143/99 | HR 96 | Temp 97.7°F | Resp 16 | Ht 63.5 in | Wt 122.0 lb

## 2023-08-20 DIAGNOSIS — M5416 Radiculopathy, lumbar region: Secondary | ICD-10-CM | POA: Insufficient documentation

## 2023-08-20 DIAGNOSIS — G8929 Other chronic pain: Secondary | ICD-10-CM | POA: Insufficient documentation

## 2023-08-20 DIAGNOSIS — M48062 Spinal stenosis, lumbar region with neurogenic claudication: Secondary | ICD-10-CM | POA: Diagnosis present

## 2023-08-20 DIAGNOSIS — G894 Chronic pain syndrome: Secondary | ICD-10-CM | POA: Insufficient documentation

## 2023-08-20 MED ORDER — METHOCARBAMOL 1000 MG/10ML IJ SOLN
200.0000 mg | Freq: Once | INTRAMUSCULAR | Status: AC
  Administered 2023-08-20: 200 mg via INTRAMUSCULAR
  Filled 2023-08-20: qty 10

## 2023-08-20 MED ORDER — KETOROLAC TROMETHAMINE 30 MG/ML IJ SOLN
30.0000 mg | Freq: Once | INTRAMUSCULAR | Status: AC
  Administered 2023-08-20: 30 mg via INTRAMUSCULAR
  Filled 2023-08-20: qty 1

## 2023-08-20 NOTE — Progress Notes (Signed)
 PROVIDER NOTE: Interpretation of information contained herein should be left to medically-trained personnel. Specific patient instructions are provided elsewhere under "Patient Instructions" section of medical record. This document was created in part using AI and STT-dictation technology, any transcriptional errors that may result from this process are unintentional.  Patient: Shirley Evans  Service: E/M   PCP: Mai Schwalbe, FNP  DOB: September 10, 1965  DOS: 08/20/2023  Provider: Cephus Collin, MD  MRN: [id number]  Delivery: Face-to-face  Specialty: Interventional Pain Management  Type: Established Patient  Setting: Ambulatory outpatient facility  Specialty designation: 09  Referring Prov.: Mai Schwalbe, FNP  Location: Outpatient office facility       History of present illness (HPI) [name], a 58 y.o. year old female, is here today because of her Chronic radicular lumbar pain [M54.16, G89.29]. Ms. Cozart primary complain today is Back Pain (Thoracic midline )   Pain Assessment: Severity of Chronic pain is reported as a 8 /10. Location: Back Mid, Left, Right/up and down the back. Onset: More than a month ago. Quality: Discomfort, Sharp, Stabbing. Timing: Intermittent. Modifying factor(s): laying down help, otc meds. Vitals:  height is 5' 3.5" (1.613 m) and weight is 122 lb (55.3 kg). Her temporal temperature is 97.7 F (36.5 C). Her blood pressure is 143/99 (abnormal) and her pulse is 96. Her respiration is 16 and oxygen saturation is 100%.  BMI: Estimated body mass index is 21.27 kg/m as calculated from the following:   Height as of this encounter: 5' 3.5" (1.613 m).   Weight as of this encounter: 122 lb (55.3 kg).  Last encounter: 01/15/2023. Last procedure: 02/11/2023.  Reason for encounter:  History of Present Illness   Shirley Evans is a 58 year old female who presents with significant right arm and back pain.  She has been experiencing significant right arm pain  following surgery in February after sustaining a fall right arm.    The pain persists despite the time elapsed since the procedure. She has been taking Tylenol  and Goody's powders for pain relief. No current use of muscle relaxers or anti-inflammatory medications.  She is experiencing severe back pain, particularly in the middle of her back, describing it as 'about to kill me.' She has a history of receiving epidural injections, with the last one being in November at the L4/ L5 level. She is seeking a repeat epidural injection. She is concerned about her insurance coverage as her Medicaid is ending, and she will be transitioning to Medicare.  Her recent medication use includes treatments for heartburn and the aforementioned pain relief medications.         ROS  Constitutional: Denies any fever or chills Gastrointestinal: No reported hemesis, hematochezia, vomiting, or acute GI distress Musculoskeletal: Low back and bilateral hip pain Neurological: No reported episodes of acute onset apraxia, aphasia, dysarthria, agnosia, amnesia, paralysis, loss of coordination, or loss of consciousness  Medication Review  acetaminophen , albuterol , buPROPion , cetirizine, cholecalciferol , cloNIDine , cyclobenzaprine , dicyclomine , ketoconazole, lidocaine , lisinopril , loperamide , magnesium  gluconate, metoprolol succinate, naproxen, omeprazole , ondansetron , rosuvastatin , sertraline , tiotropium, and valACYclovir   History Review  Allergy: Shirley Evans is allergic to demerol [meperidine] and codeine. Drug: Shirley Evans  reports current drug use. Drug: Marijuana. Alcohol:  reports current alcohol use of about 8.0 standard drinks of alcohol per week. Tobacco:  reports that she has quit smoking. Her smoking use included cigarettes. She has a 30 pack-year smoking history. She has quit using smokeless tobacco. Social: Shirley Evans  reports that she has  quit smoking. Her smoking use included cigarettes. She has a 30  pack-year smoking history. She has quit using smokeless tobacco. She reports current alcohol use of about 8.0 standard drinks of alcohol per week. She reports current drug use. Drug: Marijuana. Medical:  has a past medical history of AKI (acute kidney injury) (HCC), Bilateral carotid artery stenosis, Cancer (HCC), CCC (chronic calculous cholecystitis), Cervical intraepithelial neoplasia grade III with severe dysplasia, Closed fracture of left hip (HCC), COPD (chronic obstructive pulmonary disease) (HCC), COPD with chronic bronchitis (HCC), Coronary artery disease, Depression, Dyslipidemia, Elevated LFTs, Fatty liver, Fibrocystic breast disease, GERD (gastroesophageal reflux disease), Headache, History of Helicobacter pylori infection, Hypertension, Jackhammer esophagus, Meningioma (HCC), Menopausal state, Osteopenia, Postmenopausal atrophic vaginitis, Spinal stenosis of lumbar region without neurogenic claudication, Vertebral fracture, osteoporotic (HCC), and Vertebral fracture, osteoporotic (HCC). Surgical: Shirley Evans  has a past surgical history that includes Breast biopsy (Bilateral); Total hip arthroplasty (Left, 12/22/2020); Tonsillectomy; Colonoscopy; Spine surgery; Hand surgery; Back surgery; Cholecystectomy; Joint replacement; Fracture surgery; Colonoscopy with propofol  (N/A, 07/30/2023); Esophagogastroduodenoscopy (egd) with propofol  (N/A, 07/30/2023); Polypectomy (07/30/2023); and Hemostasis clip placement (07/30/2023). Family: family history includes Breast cancer (age of onset: 75) in her paternal aunt; Diabetes in her mother; Hypertension in her father and mother.  Laboratory Chemistry Profile   Renal Lab Results  Component Value Date   BUN 7 12/12/2022   CREATININE 0.71 12/12/2022   GFRAA >60 08/13/2019   GFRNONAA >60 12/12/2022    Hepatic Lab Results  Component Value Date   AST 99 (H) 12/12/2022   ALT 19 12/12/2022   ALBUMIN 2.8 (L) 12/12/2022   ALKPHOS 190 (H) 12/12/2022   HCVAB See  Scanned report in [place] Link (A) 01/03/2022   LIPASE 28 12/12/2022    Electrolytes Lab Results  Component Value Date   NA 135 12/12/2022   K 3.2 (L) 12/12/2022   CL 105 12/12/2022   CALCIUM  8.9 12/12/2022   MG 1.3 (L) 12/12/2022   PHOS 3.2 02/14/2022    Bone Lab Results  Component Value Date   VD25OH 25.74 (L) 12/22/2020    Inflammation (CRP: Acute Phase) (ESR: Chronic Phase) Lab Results  Component Value Date   LATICACIDVEN 1.5 02/13/2022         Note: Above Lab results reviewed.  Recent Imaging Review  DG Shoulder Right CLINICAL DATA:  fall, pain  EXAM: RIGHT SHOULDER - 2+ VIEW  COMPARISON:  None Available.  FINDINGS: There is a displaced fracture of the proximal humeral shaft with apex lateral angulation and lateral displacement of the proximal fragment. There is a mildly displaced fracture of the inferior angle of the scapula. Mild degenerative changes of the acromioclavicular joint. Humerus appears to be aligned with the glenoid.  IMPRESSION: 1. Displaced and angulated fracture of the proximal humeral shaft. 2. Mildly displaced fracture of the inferior angle of the scapula.  Electronically Signed   By: Clancy Crimes M.D.   On: 05/12/2023 16:44 Note: Reviewed         Physical Exam  General appearance: Well nourished, well developed, and well hydrated. In no apparent acute distress Mental status: Alert, oriented x 3 (person, place, & time)       Respiratory: No evidence of acute respiratory distress Eyes: PERLA Vitals: BP (!) 143/99 (BP Location: Left Arm, Patient Position: Sitting, Cuff Size: Normal)   Pulse 96   Temp 97.7 F (36.5 C) (Temporal)   Resp 16   Ht 5' 3.5" (1.613 m)   Wt 122 lb (55.3 kg)  SpO2 100%   BMI 21.27 kg/m  BMI: Estimated body mass index is 21.27 kg/m as calculated from the following:   Height as of this encounter: 5' 3.5" (1.613 m).   Weight as of this encounter: 122 lb (55.3 kg). Ideal: Ideal body weight:  53.5 kg (118 lb 0.9 oz) Adjusted ideal body weight: 54.3 kg (119 lb 10.1 oz)  MR LUMBAR SPINE WO CONTRAST   Narrative CLINICAL DATA:  Chronic low back pain for 20 years, worsening over the last 2 years. Occasional pain radiation into the legs, left greater than right. History of fractures.   EXAM: MRI LUMBAR SPINE WITHOUT CONTRAST   TECHNIQUE: Multiplanar, multisequence MR imaging of the lumbar spine was performed. No intravenous contrast was administered.   COMPARISON:  Lumbar spine radiographs 10/03/2021 and 08/29/2021. Abdominopelvic CT 08/24/2021 and 08/10/2021. Lumbar MRI 08/27/2013.   FINDINGS: Segmentation: Conventional anatomy assumed, with the last open disc space designated L5-S1.Concordant with previous imaging.   Alignment:  Physiologic.   Vertebrae: New compression deformities were noted at T10, T11, T12 and L3 on the abdominal CT performed 08/25/2021. The mild superior endplate compression fracture at L3 is unchanged from that time, without osseous retropulsion or significant residual marrow edema. The T12 fracture has progressed with more than 75% loss of vertebral body height and 6 mm of osseous retropulsion. There is marrow edema throughout the T12 vertebral body. A mild superior endplate compression fracture at T11 appears unchanged, with mild residual marrow edema. T10 not imaged. No new fractures are identified. There are chronic endplate degenerative changes at L5-S1. No evidence of pars defect or suspicious marrow lesion.   Conus medullaris: Extends to the L1 level and appears normal.   Paraspinal and other soft tissues: No significant paraspinal or epidural fluid collections or inflammatory changes are identified. Diffuse prominence of the epidural fat, especially at L4-5 and L5-S1.   Disc levels:   T11-12. Mild disc bulging. As above, osseous retropulsion at the T12 fracture with mild mass effect on the distal thoracic cord. No cord compression  or significant foraminal narrowing.   T12-L1: Normal interspace.   L1-2: Chronic loss of disc height with mild disc bulging, facet and ligamentous hypertrophy. No spinal stenosis or nerve root encroachment.   L2-3: Disc height and hydration are maintained. Mild bilateral facet hypertrophy. No significant spinal stenosis or nerve root encroachment.   L3-4: Disc height and hydration are maintained. Mild disc bulging eccentric to the right and mild facet and ligamentous hypertrophy. No significant spinal stenosis or nerve root encroachment.   L4-5: Chronic degenerative disc disease with progressive annular disc bulging and endplate osteophyte formation compared with previous MRI. Mild facet and ligamentous hypertrophy. Resulting mild spinal stenosis with mild lateral recess narrowing bilaterally. The foramina appear sufficiently patent.   L5-S1: Chronic degenerative disc disease with disc bulging and endplate osteophytes, progressive from remote MRI. Mild facet and ligamentous hypertrophy. Prominent epidural fat contributes to mild spinal stenosis. Mild osseous foraminal narrowing bilaterally due to osteophytes.   IMPRESSION: 1. Progressive loss of vertebral body height and progressive osseous retropulsion at the T12 fracture compared with CT of 08/25/2021. This fracture does not appear significantly changed from the more recent radiographs of 10/03/2021, but there is persistent associated bone marrow edema. 2. The mild fractures involving the superior endplate of T11 and L3 appear unchanged. No new fractures. 3. Superimposed spondylosis, increased from remote MRI, but similar to more recent CTs. 4. Mild multifactorial spinal stenosis at L4-5 due to disc bulging, facet  and ligamentous hypertrophy and prominent epidural fat. 5. Mild multifactorial spinal stenosis with osseous foraminal narrowing bilaterally at L5-S1.  Assessment   Diagnosis Status  1. Chronic radicular  lumbar pain   2. Lumbar radiculopathy   3. Spinal stenosis, lumbar region, with neurogenic claudication   4. Chronic pain syndrome    Having a Flare-up Having a Flare-up Controlled   Updated Problems: No problems updated.  Plan of Care  1. Chronic radicular lumbar pain (Primary) - Lumbar Epidural Injection; Future  2. Lumbar radiculopathy - Lumbar Epidural Injection; Future  3. Spinal stenosis, lumbar region, with neurogenic claudication  4. Chronic pain syndrome - Lumbar Epidural Injection; Future  Repeat lumbar epidural steroid injection that was previously done 02/11/2023.  Provided her with 75% pain relief for approximately 6 months.  Shirley Evans has a current medication list which includes the following long-term medication(s): albuterol , albuterol , lisinopril , metoprolol succinate, omeprazole , tiotropium, bupropion , rosuvastatin , and sertraline .  Pharmacotherapy (Medications Ordered): Meds ordered this encounter  Medications   ketorolac  (TORADOL ) 30 MG/ML injection 30 mg   methocarbamol (ROBAXIN) injection 200 mg   Orders:  Orders Placed This Encounter  Procedures   Lumbar Epidural Injection    Standing Status:   Future    Expected Date:   09/11/2023    Expiration Date:   11/20/2023    Scheduling Instructions:     Procedure: Interlaminar Lumbar Epidural Steroid injection (LESI)            Laterality: L4-5     Sedation: local     Timeframe: As soon as schedule allows.    Where will this procedure be performed?:   [facility]     L4-L5 ESI 02/11/2023    Return for L4/5 ESI, in clinic [facility].    Recent Visits No visits were found meeting these conditions. Showing recent visits within past 90 days and meeting all other requirements Today's Visits Date Type Provider Dept  08/20/23 Office Visit Cephus Collin, MD [facility]  Showing today's visits and meeting all other requirements Future Appointments No visits were found meeting  these conditions. Showing future appointments within next 90 days and meeting all other requirements  I discussed the assessment and treatment plan with the patient. The patient was provided an opportunity to ask questions and all were answered. The patient agreed with the plan and demonstrated an understanding of the instructions.  Patient advised to call back or seek an in-person evaluation if the symptoms or condition worsens.  Duration of encounter: .  Total time on encounter, as per AMA guidelines included both the face-to-face and non-face-to-face time personally spent by the physician and/or other qualified health care professional(s) on the day of the encounter (includes time in activities that require the physician or other qualified health care professional and does not include time in activities normally performed by clinical staff). Physician's time may include the following activities when performed: Preparing to see the patient (e.g., pre-charting review of records, searching for previously ordered imaging, lab work, and nerve conduction tests) Review of prior analgesic pharmacotherapies. Reviewing PMP Interpreting ordered tests (e.g., lab work, imaging, nerve conduction tests) Performing post-procedure evaluations, including interpretation of diagnostic procedures Obtaining and/or reviewing separately obtained history Performing a medically appropriate examination and/or evaluation Counseling and educating the patient/family/caregiver Ordering medications, tests, or procedures Referring and communicating with other health care professionals (when not separately reported) Documenting clinical information in the electronic or other health record Independently interpreting results (not separately reported) and  communicating results to the patient/ family/caregiver Care coordination (not separately reported)  Note by: Cephus Collin, MD (TTS and AI technology used. I  apologize for any typographical errors that were not detected and corrected.) Date: 08/20/2023; Time: 11:17 AM

## 2023-08-20 NOTE — Patient Instructions (Signed)
____________________________________________________________________________________________  General Risks and Possible Complications  Patient Responsibilities: It is important that you read this as it is part of your informed consent. It is our duty to inform you of the risks and possible complications associated with treatments offered to you. It is your responsibility as a patient to read this and to ask questions about anything that is not clear or that you believe was not covered in this document.  Patient's Rights: You have the right to refuse treatment. You also have the right to change your mind, even after initially having agreed to have the treatment done. However, under this last option, if you wait until the last second to change your mind, you may be charged for the materials used up to that point.  Introduction: Medicine is not an exact science. Everything in Medicine, including the lack of treatment(s), carries the potential for danger, harm, or loss (which is by definition: Risk). In Medicine, a complication is a secondary problem, condition, or disease that can aggravate an already existing one. All treatments carry the risk of possible complications. The fact that a side effects or complications occurs, does not imply that the treatment was conducted incorrectly. It must be clearly understood that these can happen even when everything is done following the highest safety standards.  No treatment: You can choose not to proceed with the proposed treatment alternative. The "PRO(s)" would include: avoiding the risk of complications associated with the therapy. The "CON(s)" would include: not getting any of the treatment benefits. These benefits fall under one of three categories: diagnostic; therapeutic; and/or palliative. Diagnostic benefits include: getting information which can ultimately lead to improvement of the disease or symptom(s). Therapeutic benefits are those associated with  the successful treatment of the disease. Finally, palliative benefits are those related to the decrease of the primary symptoms, without necessarily curing the condition (example: decreasing the pain from a flare-up of a chronic condition, such as incurable terminal cancer).  General Risks and Complications: These are associated to most interventional treatments. They can occur alone, or in combination. They fall under one of the following six (6) categories: no benefit or worsening of symptoms; bleeding; infection; nerve damage; allergic reactions; and/or death. No benefits or worsening of symptoms: In Medicine there are no guarantees, only probabilities. No healthcare provider can ever guarantee that a medical treatment will work, they can only state the probability that it may. Furthermore, there is always the possibility that the condition may worsen, either directly, or indirectly, as a consequence of the treatment. Bleeding: This is more common if the patient is taking a blood thinner, either prescription or over the counter (example: Goody Powders, Fish oil, Aspirin, Garlic, etc.), or if suffering a condition associated with impaired coagulation (example: Hemophilia, cirrhosis of the liver, low platelet counts, etc.). However, even if you do not have one on these, it can still happen. If you have any of these conditions, or take one of these drugs, make sure to notify your treating physician. Infection: This is more common in patients with a compromised immune system, either due to disease (example: diabetes, cancer, human immunodeficiency virus [HIV], etc.), or due to medications or treatments (example: therapies used to treat cancer and rheumatological diseases). However, even if you do not have one on these, it can still happen. If you have any of these conditions, or take one of these drugs, make sure to notify your treating physician. Nerve Damage: This is more common when the treatment is an    invasive one, but it can also happen with the use of medications, such as those used in the treatment of cancer. The damage can occur to small secondary nerves, or to large primary ones, such as those in the spinal cord and brain. This damage may be temporary or permanent and it may lead to impairments that can range from temporary numbness to permanent paralysis and/or brain death. Allergic Reactions: Any time a substance or material comes in contact with our body, there is the possibility of an allergic reaction. These can range from a mild skin rash (contact dermatitis) to a severe systemic reaction (anaphylactic reaction), which can result in death. Death: In general, any medical intervention can result in death, most of the time due to an unforeseen complication. ____________________________________________________________________________________________   Epidural Steroid Injection Patient Information  Description: The epidural space surrounds the nerves as they exit the spinal cord.  In some patients, the nerves can be compressed and inflamed by a bulging disc or a tight spinal canal (spinal stenosis).  By injecting steroids into the epidural space, we can bring irritated nerves into direct contact with a potentially helpful medication.  These steroids act directly on the irritated nerves and can reduce swelling and inflammation which often leads to decreased pain.  Epidural steroids may be injected anywhere along the spine and from the neck to the low back depending upon the location of your pain.   After numbing the skin with local anesthetic (like Novocaine), a small needle is passed into the epidural space slowly.  You may experience a sensation of pressure while this is being done.  The entire block usually last less than 10 minutes.  Conditions which may be treated by epidural steroids:  Low back and leg pain Neck and arm pain Spinal stenosis Post-laminectomy syndrome Herpes zoster  (shingles) pain Pain from compression fractures  Preparation for the injection:  Do not eat any solid food or dairy products within 8 hours of your appointment.  You may drink clear liquids up to 3 hours before appointment.  Clear liquids include water, black coffee, juice or soda.  No milk or cream please. You may take your regular medication, including pain medications, with a sip of water before your appointment  Diabetics should hold regular insulin (if taken separately) and take 1/2 normal NPH dos the morning of the procedure.  Carry some sugar containing items with you to your appointment. A driver must accompany you and be prepared to drive you home after your procedure.  Bring all your current medications with your. An IV may be inserted and sedation may be given at the discretion of the physician.   A blood pressure cuff, EKG and other monitors will often be applied during the procedure.  Some patients may need to have extra oxygen administered for a short period. You will be asked to provide medical information, including your allergies, prior to the procedure.  We must know immediately if you are taking blood thinners (like Coumadin/Warfarin)  Or if you are allergic to IV iodine contrast (dye). We must know if you could possible be pregnant.  Possible side-effects: Bleeding from needle site Infection (rare, may require surgery) Nerve injury (rare) Numbness & tingling (temporary) Difficulty urinating (rare, temporary) Spinal headache ( a headache worse with upright posture) Light -headedness (temporary) Pain at injection site (several days) Decreased blood pressure (temporary) Weakness in arm/leg (temporary) Pressure sensation in back/neck (temporary)  Call if you experience: Fever/chills associated with headache or increased back/neck pain.   Headache worsened by an upright position. New onset weakness or numbness of an extremity below the injection site Hives or difficulty  breathing (go to the emergency room) Inflammation or drainage at the infection site Severe back/neck pain Any new symptoms which are concerning to you  Please note:  Although the local anesthetic injected can often make your back or neck feel good for several hours after the injection, the pain will likely return.  It takes 3-7 days for steroids to work in the epidural space.  You may not notice any pain relief for at least that one week.  If effective, we will often do a series of three injections spaced 3-6 weeks apart to maximally decrease your pain.  After the initial series, we generally will wait several months before considering a repeat injection of the same type.  If you have any questions, please call [phone] [facility] 

## 2023-08-20 NOTE — Progress Notes (Signed)
 Safety precautions to be maintained throughout the outpatient stay will include: orient to surroundings, keep bed in low position, maintain call bell within reach at all times, provide assistance with transfer out of bed and ambulation.

## 2023-09-06 ENCOUNTER — Other Ambulatory Visit: Payer: Self-pay | Admitting: Gastroenterology

## 2023-09-06 DIAGNOSIS — K7030 Alcoholic cirrhosis of liver without ascites: Secondary | ICD-10-CM

## 2023-09-11 ENCOUNTER — Ambulatory Visit: Admitting: Student in an Organized Health Care Education/Training Program

## 2023-09-17 ENCOUNTER — Ambulatory Visit

## 2023-09-30 ENCOUNTER — Ambulatory Visit (HOSPITAL_BASED_OUTPATIENT_CLINIC_OR_DEPARTMENT_OTHER): Admitting: Student in an Organized Health Care Education/Training Program

## 2023-09-30 ENCOUNTER — Other Ambulatory Visit: Payer: Self-pay | Admitting: Student in an Organized Health Care Education/Training Program

## 2023-09-30 ENCOUNTER — Ambulatory Visit
Admission: RE | Admit: 2023-09-30 | Discharge: 2023-09-30 | Disposition: A | Discharge status: Home / Self Care | Source: Ambulatory Visit | Attending: Student in an Organized Health Care Education/Training Program | Admitting: Student in an Organized Health Care Education/Training Program

## 2023-09-30 ENCOUNTER — Encounter: Payer: Self-pay | Admitting: Student in an Organized Health Care Education/Training Program

## 2023-09-30 DIAGNOSIS — G894 Chronic pain syndrome: Secondary | ICD-10-CM

## 2023-09-30 DIAGNOSIS — M5416 Radiculopathy, lumbar region: Secondary | ICD-10-CM

## 2023-09-30 DIAGNOSIS — G8929 Other chronic pain: Secondary | ICD-10-CM

## 2023-09-30 MED ORDER — LIDOCAINE HCL 2 % IJ SOLN
INTRAMUSCULAR | Status: AC
  Filled 2023-09-30: qty 20

## 2023-09-30 MED ORDER — SODIUM CHLORIDE (PF) 0.9 % IJ SOLN
INTRAMUSCULAR | Status: AC
  Filled 2023-09-30: qty 10

## 2023-09-30 MED ORDER — IOHEXOL 180 MG/ML  SOLN
INTRAMUSCULAR | Status: AC
  Filled 2023-09-30: qty 20

## 2023-09-30 MED ORDER — DEXAMETHASONE SODIUM PHOSPHATE 10 MG/ML IJ SOLN
INTRAMUSCULAR | Status: AC
  Filled 2023-09-30: qty 1

## 2023-09-30 MED ORDER — TIZANIDINE HCL 2 MG PO TABS: 2.0000 mg | ORAL_TABLET | Freq: Three times a day (TID) | ORAL | 0 refills | Status: AC | PRN

## 2023-09-30 MED ORDER — SODIUM CHLORIDE 0.9% FLUSH
2.0000 mL | Freq: Once | INTRAVENOUS | Status: AC
  Administered 2023-09-30: 2 mL

## 2023-09-30 MED ORDER — LIDOCAINE HCL 2 % IJ SOLN
20.0000 mL | Freq: Once | INTRAMUSCULAR | Status: AC
  Administered 2023-09-30: 400 mg

## 2023-09-30 MED ORDER — ROPIVACAINE HCL 2 MG/ML IJ SOLN
2.0000 mL | Freq: Once | INTRAMUSCULAR | Status: AC
  Administered 2023-09-30: 2 mL via EPIDURAL

## 2023-09-30 MED ORDER — IOHEXOL 180 MG/ML  SOLN
10.0000 mL | Freq: Once | INTRAMUSCULAR | Status: AC
  Administered 2023-09-30: 10 mL via EPIDURAL

## 2023-09-30 MED ORDER — ROPIVACAINE HCL 2 MG/ML IJ SOLN
INTRAMUSCULAR | Status: AC
  Filled 2023-09-30: qty 20

## 2023-09-30 MED ORDER — DEXAMETHASONE SODIUM PHOSPHATE 10 MG/ML IJ SOLN
10.0000 mg | Freq: Once | INTRAMUSCULAR | Status: AC
  Administered 2023-09-30: 10 mg

## 2023-09-30 NOTE — Progress Notes (Addendum)
 Patient informed me that she has not eaten in two weeks and her MD is aware, Breathing also with wheezes and labored,. MD aware

## 2023-09-30 NOTE — Patient Instructions (Signed)

## 2023-09-30 NOTE — Progress Notes (Signed)
 PROVIDER NOTE: Interpretation of information contained herein should be left to medically-trained personnel. Specific patient instructions are provided elsewhere under Patient Instructions section of medical record. This document was created in part using STT-dictation technology, any transcriptional errors that may result from this process are unintentional.  Patient: Shirley Evans Type: Established DOB: [birth year] MRN: [id number] PCP: Camelia Sherwood BIRCH, FNP  Service: Procedure DOS: 09/30/2023 Setting: Ambulatory Location: Ambulatory outpatient facility Delivery: Face-to-face Provider: Wallie Sherry, MD Specialty: Interventional Pain Management Specialty designation: 09 Location: Outpatient facility Ref. Prov.: Sherry Wallie, MD       Interventional Therapy   Type: Lumbar epidural steroid injection (LESI) (interlaminar) #1    Laterality: Left   Level:  L4-5 Level.  Imaging: Fluoroscopic guidance         Anesthesia: Local anesthesia (1-2% Lidocaine ) DOS: 09/30/2023  Performed by: Wallie Sherry, MD  Purpose: Diagnostic/Therapeutic Indications: Lumbar radicular pain of intraspinal etiology of more than 4 weeks that has failed to respond to conservative therapy and is severe enough to impact quality of life or function. 1. Chronic radicular lumbar pain   2. Lumbar radiculopathy   3. Chronic pain syndrome    NAS-11 Pain score:   Pre-procedure: 8 /10   Post-procedure: 3 /10      Position / Prep / Materials:  Position: Prone w/ head of the table raised (slight reverse trendelenburg) to facilitate breathing.  Prep solution: ChloraPrep (2% chlorhexidine  gluconate and 70% isopropyl alcohol) Prep Area: Entire Posterior Lumbar Region from lower scapular tip down to mid buttocks area and from flank to flank. Materials:  Tray: Epidural tray Needle(s):  Type: Epidural needle (Tuohy) Gauge (G):  22 Length: Regular (3.5-in) Qty: 1   H&P (Pre-op Assessment):  Shirley Evans is a 58 y.o.  (year old), female patient, seen today for interventional treatment. She  has a past surgical history that includes Breast biopsy (Bilateral); Total hip arthroplasty (Left, 12/22/2020); Tonsillectomy; Colonoscopy; Spine surgery; Hand surgery; Back surgery; Cholecystectomy; Joint replacement; Fracture surgery; Colonoscopy with propofol  (N/A, 07/30/2023); Esophagogastroduodenoscopy (egd) with propofol  (N/A, 07/30/2023); Polypectomy (07/30/2023); and Hemostasis clip placement (07/30/2023). Shirley Evans has a current medication list which includes the following prescription(s): acetaminophen , albuterol , albuterol , cetirizine, cholecalciferol , cyclobenzaprine , lidocaine , lisinopril , loperamide , magnesium  gluconate, naproxen, omeprazole , ondansetron , tiotropium, tizanidine , valacyclovir , bupropion , clonidine , dicyclomine , ketoconazole, metoprolol succinate, rosuvastatin , and sertraline , and the following Facility-Administered Medications: dexamethasone , iohexol , lidocaine , ropivacaine  (pf) 2 mg/ml (0.2%), and sodium chloride  flush. Her primarily concern today is the Back Pain (lower)  Initial Vital Signs:  Pulse/HCG Rate: 92ECG Heart Rate: 87 Temp: 97.6 F (36.4 C) Resp: 18 BP: 119/84 SpO2: 99 %  BMI: Estimated body mass index is 20.9 kg/m as calculated from the following:   Height as of this encounter: 5' 3 (1.6 m).   Weight as of this encounter: 118 lb (53.5 kg).  Risk Assessment: Allergies: Reviewed. She is allergic to demerol [meperidine] and codeine.  Allergy Precautions: None required Coagulopathies: Reviewed. None identified.  Blood-thinner therapy: None at this time Active Infection(s): Reviewed. None identified. Shirley Evans is afebrile  Site Confirmation: Shirley Evans was asked to confirm the procedure and laterality before marking the site Procedure checklist: Completed Consent: Before the procedure and under the influence of no sedative(s), amnesic(s), or anxiolytics, the patient was informed of  the treatment options, risks and possible complications. To fulfill our ethical and legal obligations, as recommended by the American Medical Association's Code of Ethics, I have informed the patient of my clinical impression; the nature and purpose of the  treatment or procedure; the risks, benefits, and possible complications of the intervention; the alternatives, including doing nothing; the risk(s) and benefit(s) of the alternative treatment(s) or procedure(s); and the risk(s) and benefit(s) of doing nothing. The patient was provided information about the general risks and possible complications associated with the procedure. These may include, but are not limited to: failure to achieve desired goals, infection, bleeding, organ or nerve damage, allergic reactions, paralysis, and death. In addition, the patient was informed of those risks and complications associated to Spine-related procedures, such as failure to decrease pain; infection (i.e.: Meningitis, epidural or intraspinal abscess); bleeding (i.e.: epidural hematoma, subarachnoid hemorrhage, or any other type of intraspinal or peri-dural bleeding); organ or nerve damage (i.e.: Any type of peripheral nerve, nerve root, or spinal cord injury) with subsequent damage to sensory, motor, and/or autonomic systems, resulting in permanent pain, numbness, and/or weakness of one or several areas of the body; allergic reactions; (i.e.: anaphylactic reaction); and/or death. Furthermore, the patient was informed of those risks and complications associated with the medications. These include, but are not limited to: allergic reactions (i.e.: anaphylactic or anaphylactoid reaction(s)); adrenal axis suppression; blood sugar elevation that in diabetics may result in ketoacidosis or comma; water retention that in patients with history of congestive heart failure may result in shortness of breath, pulmonary edema, and decompensation with resultant heart failure; weight  gain; swelling or edema; medication-induced neural toxicity; particulate matter embolism and blood vessel occlusion with resultant organ, and/or nervous system infarction; and/or aseptic necrosis of one or more joints. Finally, the patient was informed that Medicine is not an exact science; therefore, there is also the possibility of unforeseen or unpredictable risks and/or possible complications that may result in a catastrophic outcome. The patient indicated having understood very clearly. We have given the patient no guarantees and we have made no promises. Enough time was given to the patient to ask questions, all of which were answered to the patient's satisfaction. Ms. Blakley has indicated that she wanted to continue with the procedure. Attestation: I, the ordering provider, attest that I have discussed with the patient the benefits, risks, side-effects, alternatives, likelihood of achieving goals, and potential problems during recovery for the procedure that I have provided informed consent. Date  Time: 09/30/2023 11:18 AM   Pre-Procedure Preparation:  Monitoring: As per clinic protocol. Respiration, ETCO2, SpO2, BP, heart rate and rhythm monitor placed and checked for adequate function Safety Precautions: Patient was assessed for positional comfort and pressure points before starting the procedure. Time-out: I initiated and conducted the Time-out before starting the procedure, as per protocol. The patient was asked to participate by confirming the accuracy of the Time Out information. Verification of the correct person, site, and procedure were performed and confirmed by me, the nursing staff, and the patient. Time-out conducted as per Joint Commission's Universal Protocol (UP.01.01.01). Time: 1211 Start Time: 1211 hrs.  Description/Narrative of Procedure:          Target: Epidural space via interlaminar opening, initially targeting the lower laminar border of the superior vertebral  body. Region: Lumbar Approach: Percutaneous paravertebral  Rationale (medical necessity): procedure needed and proper for the diagnosis and/or treatment of the patient's medical symptoms and needs. Procedural Technique Safety Precautions: Aspiration looking for blood return was conducted prior to all injections. At no point did we inject any substances, as a needle was being advanced. No attempts were made at seeking any paresthesias. Safe injection practices and needle disposal techniques used. Medications properly checked for expiration  dates. SDV (single dose vial) medications used. Description of the Procedure: Protocol guidelines were followed. The procedure needle was introduced through the skin, ipsilateral to the reported pain, and advanced to the target area. Bone was contacted and the needle walked caudad, until the lamina was cleared. The epidural space was identified using "loss-of-resistance technique" with 2-3 ml of PF-NaCl (0.9% NSS), in a 5cc LOR glass syringe.  Vitals:   09/30/23 1127 09/30/23 1210 09/30/23 1215  BP: 119/84 (!) 130/95 (!) 142/101  Pulse: 92    Resp: 18 16 15   Temp: 97.6 F (36.4 C)    TempSrc: Temporal    SpO2: 99% 100% 100%  Weight: 118 lb (53.5 kg)    Height: 5' 3 (1.6 m)      Start Time: 1211 hrs. End Time: 1213 hrs.  Imaging Guidance (Spinal):          Type of Imaging Technique: Fluoroscopy Guidance (Spinal) Indication(s): Fluoroscopy guidance for needle placement to enhance accuracy in procedures requiring precise needle localization for targeted delivery of medication in or near specific anatomical locations not easily accessible without such real-time imaging assistance. Exposure Time: Please see nurses notes. Contrast: Before injecting any contrast, we confirmed that the patient did not have an allergy to iodine, shellfish, or radiological contrast. Once satisfactory needle placement was completed at the desired level, radiological contrast was  injected. Contrast injected under live fluoroscopy. No contrast complications. See chart for type and volume of contrast used. Fluoroscopic Guidance: I was personally present during the use of fluoroscopy. Tunnel Vision Technique used to obtain the best possible view of the target area. Parallax error corrected before commencing the procedure. Direction-depth-direction technique used to introduce the needle under continuous pulsed fluoroscopy. Once target was reached, antero-posterior, oblique, and lateral fluoroscopic projection used confirm needle placement in all planes. Images permanently stored in EMR. Interpretation: I personally interpreted the imaging intraoperatively. Adequate needle placement confirmed in multiple planes. Appropriate spread of contrast into desired area was observed. No evidence of afferent or efferent intravascular uptake. No intrathecal or subarachnoid spread observed. Permanent images saved into the patient's record.  Antibiotic Prophylaxis:   Anti-infectives (From admission, onward)    None      Indication(s): None identified  Post-operative Assessment:  Post-procedure Vital Signs:  Pulse/HCG Rate: 9288 Temp: 97.6 F (36.4 C) Resp: 15 BP: (!) 142/101 (patient did not take BP med this am she had just goptten up before coming today) SpO2: 100 %  EBL: None  Complications: No immediate post-treatment complications observed by team, or reported by patient.  Note: The patient tolerated the entire procedure well. A repeat set of vitals were taken after the procedure and the patient was kept under observation following institutional policy, for this type of procedure. Post-procedural neurological assessment was performed, showing return to baseline, prior to discharge. The patient was provided with post-procedure discharge instructions, including a section on how to identify potential problems. Should any problems arise concerning this procedure, the patient was  given instructions to immediately contact us , at any time, without hesitation. In any case, we plan to contact the patient by telephone for a follow-up status report regarding this interventional procedure.  Comments:  No additional relevant information.  Plan of Care (POC)  Orders:  Orders Placed This Encounter  Procedures   DG Cervical Spine Complete    Patient presents with axial pain with possible radicular component. Please assist us  in identifying specific level(s) and laterality of any additional findings such as: 1. Facet (  Zygapophyseal) joint DJD (Hypertrophy, space narrowing, subchondral sclerosis, and/or osteophyte formation) 2. DDD and/or IVDD (Loss of disc height, desiccation, gas patterns, osteophytes, endplate sclerosis, or Black disc disease) 3. Pars defects 4. Spondylolisthesis, spondylosis, and/or spondyloarthropathies (include Degree/Grade of displacement in mm) (stability) 5. Vertebral body Fractures (acute/chronic) (state percentage of collapse) 6. Demineralization (osteopenia/osteoporotic) 7. Bone pathology 8. Foraminal narrowing  9. Surgical changes    Standing Status:   Future    Number of Occurrences:   1    Expected Date:   09/30/2023    Expiration Date:   12/31/2023    Scheduling Instructions:     Please make sure that the patient understands that this needs to be done as soon as possible. Never have the patient do the imaging just before the next appointment. Inform patient that having the imaging done within the [facility] will expedite the availability of the results and will provide      imaging availability to the requesting physician. In addition inform the patient that the imaging order has an expiration date and will not be renewed if not done within the active period.    Reason for Exam (SYMPTOM  OR DIAGNOSIS REQUIRED):   Cervicalgia, chronic neck pain    Is patient pregnant?:   No    Preferred imaging location?:   [facility]    Call Results-  Best Contact Number?:   [phone] [facility] Lumbar Spine Complete W/Bend    Patient presents with axial pain with possible radicular component. Please assist us  in identifying specific level(s) and laterality of any additional findings such as: 1. Facet (Zygapophyseal) joint DJD (Hypertrophy, space narrowing, subchondral sclerosis, and/or osteophyte formation) 2. DDD and/or IVDD (Loss of disc height, desiccation, gas patterns, osteophytes, endplate sclerosis, or Black disc disease) 3. Pars defects 4. Spondylolisthesis, spondylosis, and/or spondyloarthropathies (include Degree/Grade of displacement in mm) (stability) 5. Vertebral body Fractures (acute/chronic) (state percentage of collapse) 6. Demineralization (osteopenia/osteoporotic) 7. Bone pathology 8. Foraminal narrowing  9. Surgical changes    Standing Status:   Future    Number of Occurrences:   1    Expiration Date:   12/31/2023    Scheduling Instructions:     Please make sure that the patient understands that this needs to be done as soon as possible. Never have the patient do the imaging just before the next appointment. Inform patient that having the imaging done within the [facility] will expedite the availability of the results and will provide      imaging availability to the requesting physician. In addition inform the patient that the imaging order has an expiration date and will not be renewed if not done within the active period.    Reason for Exam (SYMPTOM  OR DIAGNOSIS REQUIRED):   Low back pain    Is patient pregnant?:   No    Preferred imaging location?:   [facility]    Call Results- Best Contact Number?:   [phone] [place] [facility]    Radiology Contrast Protocol - do NOT remove file path:   [email or web address]    Release to patient:   Immediate     Medications ordered for procedure: Meds ordered this encounter  Medications   iohexol  (OMNIPAQUE ) 180 MG/ML injection 10 mL    Must be Myelogram-compatible. If not available, you may substitute with a water-soluble, non-ionic, hypoallergenic, myelogram-compatible radiological contrast medium.   lidocaine  (XYLOCAINE )  2 % (with pres) injection 400 mg   ropivacaine  (PF) 2 mg/mL (0.2%) (NAROPIN ) injection 2 mL   sodium chloride  flush (NS) 0.9 % injection 2 mL   dexamethasone  (DECADRON ) injection 10 mg   tiZANidine  (ZANAFLEX ) 2 MG tablet    Sig: Take 1 tablet (2 mg total) by mouth every 8 (eight) hours as needed for muscle spasms.    Dispense:  90 tablet    Refill:  0    Do not place this medication, or any other prescription from our practice, on Automatic Refill. Patient may have prescription filled one day early if pharmacy is closed on scheduled refill date.   Medications administered: Ledora L. Mccook had no medications administered during this visit.  See the medical record for exact dosing, route, and time of administration.  Follow-up plan:   Return in about 4 weeks (around 10/28/2023) for PPE, VV.       L4-L5 ESI 02/11/2023, 09/30/23    Recent Visits Date Type Provider Dept  08/20/23 Office Visit Marcelino Nurse, MD [facility]  Showing recent visits within past 90 days and meeting all other requirements Today's Visits Date Type Provider Dept  09/30/23 Procedure visit Marcelino Nurse, MD [facility]  Showing today's visits and meeting all other requirements Future Appointments Date Type Provider Dept  10/22/23 Appointment Marcelino Nurse, MD [facility]  Showing future appointments within next 90 days and meeting all other requirements  Disposition: Discharge home  Discharge (Date  Time): 09/30/2023; 1219 hrs.   Primary Care Physician: Camelia Sherwood BIRCH, FNP Location: [facility] Note by: Nurse Marcelino, MD (TTS  technology used. I apologize for any typographical errors that were not detected and corrected.) Date: 09/30/2023; Time: 12:56 PM  Disclaimer:  Medicine is not an Visual merchandiser. The only guarantee in medicine is that nothing is guaranteed. It is important to note that the decision to proceed with this intervention was based on the information collected from the patient. The Data and conclusions were drawn from the patient's questionnaire, the interview, and the physical examination. Because the information was provided in large part by the patient, it cannot be guaranteed that it has not been purposely or unconsciously manipulated. Every effort has been made to obtain as much relevant data as possible for this evaluation. It is important to note that the conclusions that lead to this procedure are derived in large part from the available data. Always take into account that the treatment will also be dependent on availability of resources and existing treatment guidelines, considered by other Pain Management Practitioners as being common knowledge and practice, at the time of the intervention. For Medico-Legal purposes, it is also important to point out that variation in procedural techniques and pharmacological choices are the acceptable norm. The indications, contraindications, technique, and results of the above procedure should only be interpreted and judged by a Board-Certified Interventional Pain Specialist with extensive familiarity and expertise in the same exact procedure and technique.

## 2023-10-01 ENCOUNTER — Ambulatory Visit: Payer: Self-pay | Admitting: *Deleted

## 2023-10-01 NOTE — Telephone Encounter (Signed)
-----   Message from Wallie Sherry sent at 10/01/2023  8:44 AM EDT ----- Please inform pts of xray results  Let her know: I'll review everything in greater detail, including her X-rays on July 29.  Recommend using gentle stretching and applying heat to the painful areas, as that may help ease some of the discomfort. ----- Message ----- From: Interface, Rad Results In Sent: 10/01/2023   8:24 AM EDT To: Wallie Sherry, MD

## 2023-10-01 NOTE — Telephone Encounter (Signed)
X-ray results read to patient. 

## 2023-10-20 ENCOUNTER — Other Ambulatory Visit: Payer: Self-pay

## 2023-10-20 DIAGNOSIS — R4781 Slurred speech: Secondary | ICD-10-CM | POA: Diagnosis not present

## 2023-10-20 DIAGNOSIS — R4182 Altered mental status, unspecified: Secondary | ICD-10-CM | POA: Insufficient documentation

## 2023-10-20 DIAGNOSIS — Y908 Blood alcohol level of 240 mg/100 ml or more: Secondary | ICD-10-CM | POA: Insufficient documentation

## 2023-10-20 DIAGNOSIS — S8992XA Unspecified injury of left lower leg, initial encounter: Secondary | ICD-10-CM | POA: Diagnosis present

## 2023-10-20 DIAGNOSIS — Y92410 Unspecified street and highway as the place of occurrence of the external cause: Secondary | ICD-10-CM | POA: Insufficient documentation

## 2023-10-20 DIAGNOSIS — F10129 Alcohol abuse with intoxication, unspecified: Secondary | ICD-10-CM | POA: Diagnosis not present

## 2023-10-20 DIAGNOSIS — E876 Hypokalemia: Secondary | ICD-10-CM | POA: Insufficient documentation

## 2023-10-20 DIAGNOSIS — S80812A Abrasion, left lower leg, initial encounter: Secondary | ICD-10-CM | POA: Diagnosis not present
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
## 2023-10-21 ENCOUNTER — Emergency Department
Admission: EM | Admit: 2023-10-21 | Discharge: 2023-10-21 | Disposition: A | Discharge status: Home / Self Care | Attending: Emergency Medicine | Admitting: Emergency Medicine

## 2023-10-21 ENCOUNTER — Telehealth: Payer: Self-pay

## 2023-10-21 DIAGNOSIS — E876 Hypokalemia: Secondary | ICD-10-CM

## 2023-10-21 DIAGNOSIS — F10929 Alcohol use, unspecified with intoxication, unspecified: Secondary | ICD-10-CM

## 2023-10-21 LAB — CBC
HCT: 34.4 % — ABNORMAL LOW (ref 36.0–46.0)
Hemoglobin: 11.6 g/dL — ABNORMAL LOW (ref 12.0–15.0)
MCH: 31.8 pg (ref 26.0–34.0)
MCHC: 33.7 g/dL (ref 30.0–36.0)
MCV: 94.2 fL (ref 80.0–100.0)
Platelets: 281 K/uL (ref 150–400)
RBC: 3.65 MIL/uL — ABNORMAL LOW (ref 3.87–5.11)
RDW: 17.2 % — ABNORMAL HIGH (ref 11.5–15.5)
WBC: 14.2 K/uL — ABNORMAL HIGH (ref 4.0–10.5)
nRBC: 0 % (ref 0.0–0.2)

## 2023-10-21 LAB — URINALYSIS, ROUTINE W REFLEX MICROSCOPIC
Bilirubin Urine: NEGATIVE
Glucose, UA: NEGATIVE mg/dL
Hgb urine dipstick: NEGATIVE
Ketones, ur: NEGATIVE mg/dL
Leukocytes,Ua: NEGATIVE
Nitrite: NEGATIVE
Protein, ur: NEGATIVE mg/dL
Specific Gravity, Urine: 1.002 — ABNORMAL LOW (ref 1.005–1.030)
pH: 7 (ref 5.0–8.0)

## 2023-10-21 LAB — BASIC METABOLIC PANEL WITH GFR
Anion gap: 17 — ABNORMAL HIGH (ref 5–15)
BUN: 7 mg/dL (ref 6–20)
CO2: 26 mmol/L (ref 22–32)
Calcium: 8.9 mg/dL (ref 8.9–10.3)
Chloride: 99 mmol/L (ref 98–111)
Creatinine, Ser: 0.66 mg/dL (ref 0.44–1.00)
GFR, Estimated: >60 mL/min (ref >60)
Glucose, Bld: 110 mg/dL — ABNORMAL HIGH (ref 70–99)
Potassium: 3.1 mmol/L — ABNORMAL LOW (ref 3.5–5.1)
Sodium: 142 mmol/L (ref 135–145)

## 2023-10-21 LAB — URINE DRUG SCREEN, QUALITATIVE (ARMC ONLY)
Amphetamines, Ur Screen: NOT DETECTED
Barbiturates, Ur Screen: NOT DETECTED
Benzodiazepine, Ur Scrn: NOT DETECTED
Cannabinoid 50 Ng, Ur ~~LOC~~: POSITIVE — AB
Cocaine Metabolite,Ur ~~LOC~~: NOT DETECTED
MDMA (Ecstasy)Ur Screen: NOT DETECTED
Methadone Scn, Ur: NOT DETECTED
Opiate, Ur Screen: NOT DETECTED
Phencyclidine (PCP) Ur S: NOT DETECTED
Tricyclic, Ur Screen: NOT DETECTED

## 2023-10-21 LAB — ETHANOL: Alcohol, Ethyl (B): 331 mg/dL (ref <15)

## 2023-10-21 MED ORDER — POTASSIUM CHLORIDE CRYS ER 20 MEQ PO TBCR
20.0000 meq | EXTENDED_RELEASE_TABLET | Freq: Every day | ORAL | 1 refills | Status: AC
Start: 2023-10-21 — End: ?

## 2023-10-21 MED ORDER — MAGNESIUM GLUCONATE 500 MG PO TABS: 500.0000 mg | ORAL_TABLET | Freq: Every day | ORAL | 1 refills | Status: AC

## 2023-10-21 MED ORDER — POTASSIUM CHLORIDE CRYS ER 20 MEQ PO TBCR
40.0000 meq | EXTENDED_RELEASE_TABLET | Freq: Once | ORAL | Status: AC
  Administered 2023-10-21: 40 meq via ORAL
  Filled 2023-10-21: qty 2

## 2023-10-21 NOTE — ED Notes (Signed)
 EDP Gordan notified of pt elevated ETOH level of 331 at 0056.

## 2023-10-21 NOTE — ED Notes (Signed)
 Pt noted with dried blood to left foot lateral and left thigh lateral.  Areas cleansed and scratch noted to foot and small scabbed area to leg. Both non bleeding at this time.
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
## 2023-10-21 NOTE — Telephone Encounter (Signed)
 Attempt to contact patient to go over pre-telephone visit questions for her appointment tomorrow with Dr. Marcelino.   No answer and voicemail box is full.

## 2023-10-22 ENCOUNTER — Ambulatory Visit
Attending: Student in an Organized Health Care Education/Training Program | Admitting: Student in an Organized Health Care Education/Training Program

## 2024-02-10 ENCOUNTER — Other Ambulatory Visit: Payer: Self-pay | Admitting: Physician Assistant

## 2024-02-10 DIAGNOSIS — M7021 Olecranon bursitis, right elbow: Secondary | ICD-10-CM

## 2024-02-13 ENCOUNTER — Ambulatory Visit: Admission: RE | Admit: 2024-02-13

## 2024-02-25 ENCOUNTER — Other Ambulatory Visit: Payer: Self-pay | Admitting: Gastroenterology

## 2024-02-25 DIAGNOSIS — F1010 Alcohol abuse, uncomplicated: Secondary | ICD-10-CM

## 2024-04-02 ENCOUNTER — Ambulatory Visit
Admission: RE | Admit: 2024-04-02 | Discharge: 2024-04-02 | Disposition: A | Discharge status: Home / Self Care | Source: Ambulatory Visit | Attending: Gastroenterology | Admitting: Gastroenterology

## 2024-04-02 DIAGNOSIS — F1010 Alcohol abuse, uncomplicated: Secondary | ICD-10-CM | POA: Diagnosis present
